# Patient Record
Sex: Female | Born: 1950 | Race: White | Hispanic: No | Smoking: Never smoker
Health system: Southern US, Community
[De-identification: ages and names within clinical notes are randomized; demographics above are authoritative.]

## PROBLEM LIST (undated history)

## (undated) DIAGNOSIS — F32A Depression, unspecified: Secondary | ICD-10-CM

## (undated) DIAGNOSIS — F329 Major depressive disorder, single episode, unspecified: Secondary | ICD-10-CM

## (undated) DIAGNOSIS — K219 Gastro-esophageal reflux disease without esophagitis: Secondary | ICD-10-CM

## (undated) DIAGNOSIS — F419 Anxiety disorder, unspecified: Secondary | ICD-10-CM

## (undated) DIAGNOSIS — L400 Psoriasis vulgaris: Secondary | ICD-10-CM

## (undated) DIAGNOSIS — I10 Essential (primary) hypertension: Secondary | ICD-10-CM

## (undated) DIAGNOSIS — I4891 Unspecified atrial fibrillation: Secondary | ICD-10-CM

## (undated) DIAGNOSIS — E079 Disorder of thyroid, unspecified: Secondary | ICD-10-CM

## (undated) HISTORY — DX: Depression, unspecified: F32.A

## (undated) HISTORY — DX: Anxiety disorder, unspecified: F41.9

## (undated) HISTORY — DX: Psoriasis vulgaris: L40.0

## (undated) HISTORY — DX: Unspecified atrial fibrillation: I48.91

## (undated) HISTORY — DX: Major depressive disorder, single episode, unspecified: F32.9

## (undated) HISTORY — DX: Essential (primary) hypertension: I10

## (undated) HISTORY — PX: ORIF ANKLE FRACTURE: SUR919

## (undated) HISTORY — DX: Disorder of thyroid, unspecified: E07.9

## (undated) HISTORY — DX: Gastro-esophageal reflux disease without esophagitis: K21.9

---

## 2006-04-26 ENCOUNTER — Ambulatory Visit: Payer: Self-pay | Admitting: Internal Medicine

## 2006-05-09 ENCOUNTER — Ambulatory Visit: Payer: Self-pay | Admitting: Internal Medicine

## 2007-04-23 ENCOUNTER — Emergency Department (HOSPITAL_COMMUNITY): Admission: EM | Admit: 2007-04-23 | Discharge: 2007-04-23 | Payer: Self-pay | Admitting: Emergency Medicine

## 2007-05-01 ENCOUNTER — Other Ambulatory Visit: Admission: RE | Admit: 2007-05-01 | Discharge: 2007-05-01 | Payer: Self-pay | Admitting: Family Medicine

## 2008-07-30 IMAGING — CR DG CHEST 1V PORT
1 series · 1 of 1 positions shown · non-contrast
Comparison: None.

CLINICAL DATA: 55 year old; chest pain.
 PORTABLE CHEST - 1 VIEW - 04/23/07:

[view not recorded]
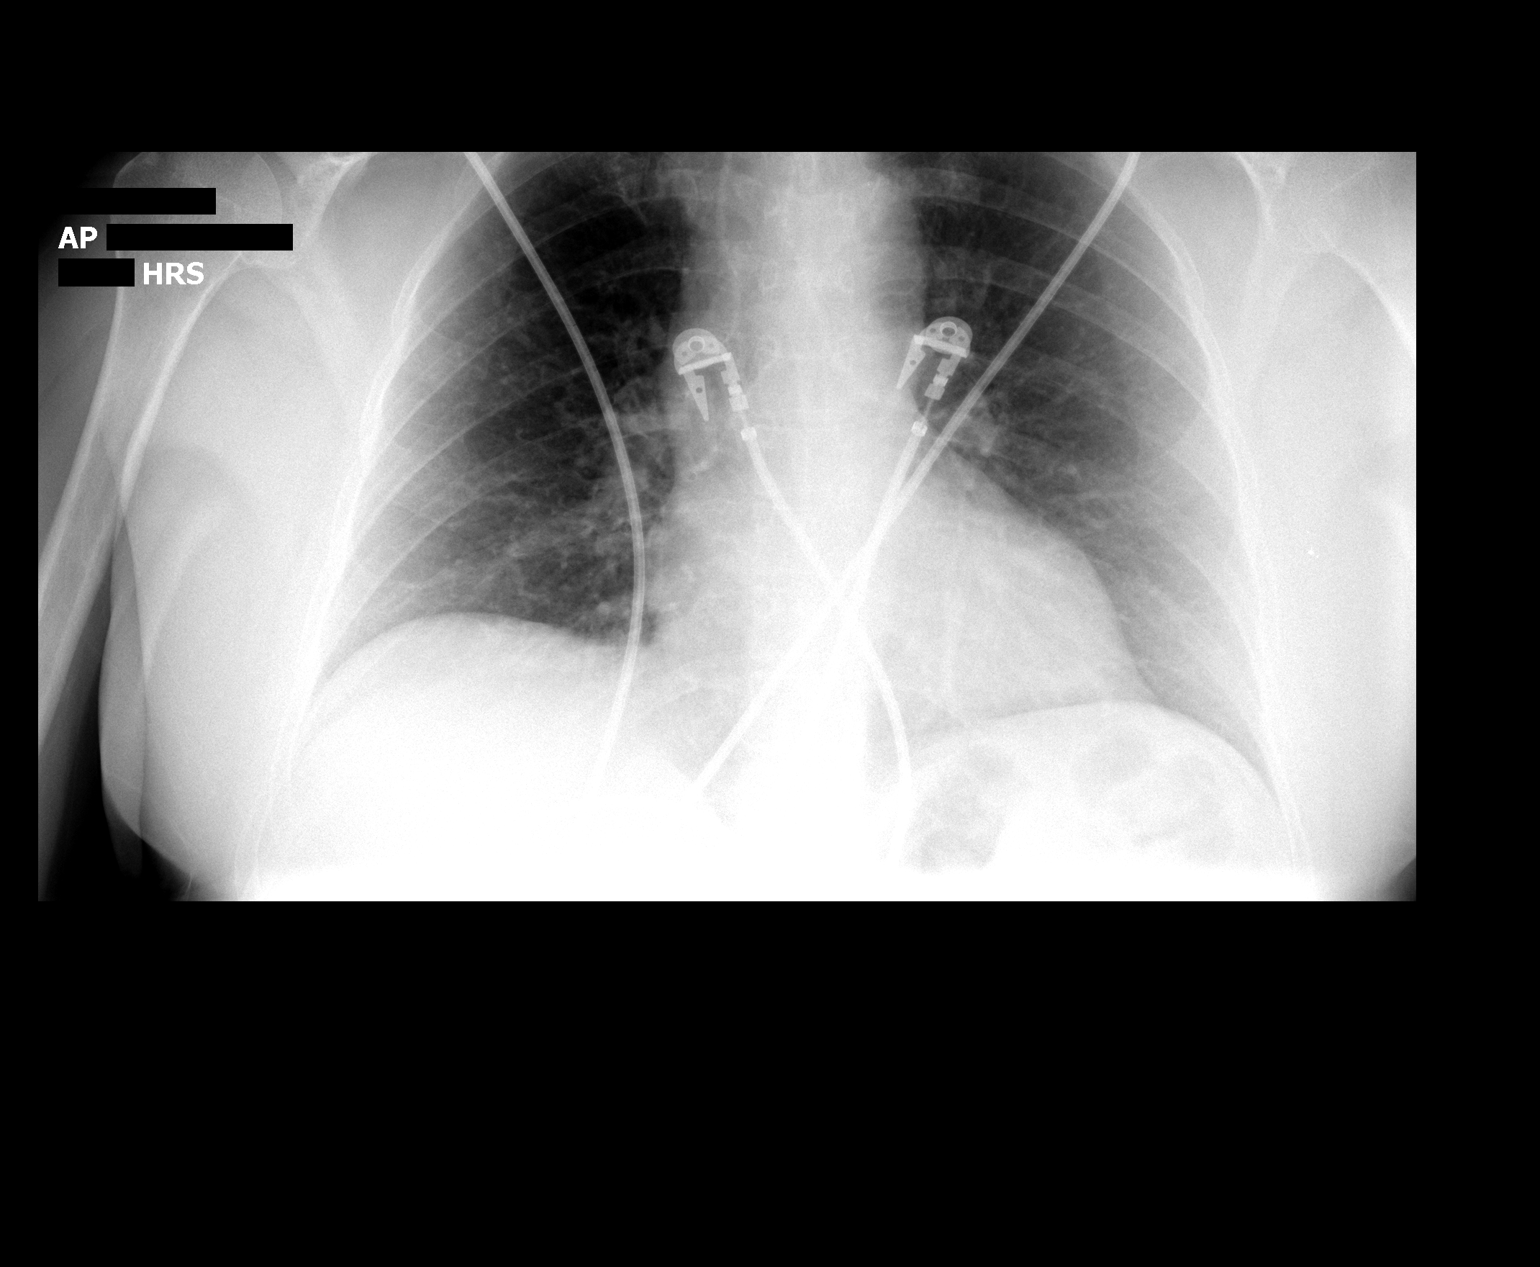

[1 of 1 positions shown; findings below may reference images not displayed]

FINDINGS: The cardiac silhouette, mediastinal and hilar contours are within normal limits.   The lungs are clear.   The bony structures are intact.
IMPRESSION: No acute cardiopulmonary findings.

## 2008-12-29 ENCOUNTER — Ambulatory Visit: Payer: Self-pay | Admitting: Gastroenterology

## 2008-12-29 DIAGNOSIS — R05 Cough: Secondary | ICD-10-CM

## 2008-12-29 DIAGNOSIS — R059 Cough, unspecified: Secondary | ICD-10-CM | POA: Insufficient documentation

## 2008-12-29 DIAGNOSIS — R1319 Other dysphagia: Secondary | ICD-10-CM | POA: Insufficient documentation

## 2008-12-29 DIAGNOSIS — R1110 Vomiting, unspecified: Secondary | ICD-10-CM | POA: Insufficient documentation

## 2009-01-05 ENCOUNTER — Encounter: Payer: Self-pay | Admitting: Gastroenterology

## 2009-01-05 ENCOUNTER — Ambulatory Visit (HOSPITAL_COMMUNITY): Admission: RE | Admit: 2009-01-05 | Discharge: 2009-01-05 | Payer: Self-pay | Admitting: Gastroenterology

## 2009-01-05 ENCOUNTER — Ambulatory Visit: Payer: Self-pay | Admitting: Gastroenterology

## 2009-01-06 ENCOUNTER — Telehealth: Payer: Self-pay | Admitting: Gastroenterology

## 2009-01-13 ENCOUNTER — Telehealth: Payer: Self-pay | Admitting: Gastroenterology

## 2009-01-13 ENCOUNTER — Ambulatory Visit: Payer: Self-pay | Admitting: Gastroenterology

## 2009-01-20 ENCOUNTER — Ambulatory Visit: Payer: Self-pay | Admitting: Gastroenterology

## 2011-03-15 NOTE — Consult Note (Signed)
[name], [name]                  ACCOUNT NO.:  [id number]   MEDICAL RECORD NO.:  [id number]          PATIENT TYPE:  EMS   LOCATION:  [facility]                         FACILITY:  [facility]   PHYSICIAN:  Corky Crafts, MDDATE OF BIRTH:  October 01, 1951   DATE OF CONSULTATION:  04/23/2007  DATE OF DISCHARGE:                                 CONSULTATION   REFERRING:  Dr. Marinda Elk.   REASON FOR CONSULTATION:  Diaphoresis and abdominal pain.   HISTORY OF PRESENT ILLNESS:  The patient is a 60 year old woman known no  known history of coronary artery disease, who has had a 3-week history  of fatigue that started after a vacation in [place].  She has  experienced increasing fatigue and diaphoresis over the past 2 days.  There is also 1 episode of squeezing across the midabdomen.  She has had  diarrhea, nausea, as well as vomiting.  She denies dyspnea, chest pain  or syncope.  She does not have palpitations.  She saw her primary care  physician.  There was some J-point depression noted on the EKG and  severe diaphoresis; she was then sent to the emergency room further  cardiac evaluation.   PAST MEDICAL HISTORY:  1. Depression.  2. Hypothyroidism.   FAMILY HISTORY:  Significant for coronary artery disease.   SOCIAL HISTORY:  She is a Runner, broadcasting/film/video.  She does not drink, smoke or use any  drugs.  She lives with her husband.   ALLERGIES:  CODEINE.   MEDICATIONS:  1. Effexor 150 mg a day.  2. Synthroid 75 mcg a day.   REVIEW OF SYSTEMS:  Significant for nausea, vomiting, diarrhea,  squeezing cross the abdomen and fatigue.  No chest pain.  No shortness  of breath.  No palpitations.  No syncope.  No focal weakness.  No rash.  All other systems negative.   PHYSICAL EXAM:  VITAL SIGNS:  Blood pressure is 143/90, pulse of 87,  respiratory rate of 18, ninety-five percent on room air.  GENERAL:  She is awake and alert, in no apparent distress.  HEENT:  Head:  Normocephalic, atraumatic.  Eyes:   Extraocular movements  are intact.  NECK:  No JVD.  CARDIOVASCULAR:  Regular rate and rhythm, S1 and S2.  LUNGS:  Clear to auscultation bilaterally.  ABDOMEN:  Mild diffuse tenderness.  No rebound tenderness.  No guarding.  EXTREMITIES:  Showed no edema.  NEUROLOGIC:  No focal deficits.  SKIN:  No rash.  BACK:  No kyphosis.  PSYCHIATRIC:  Normal mood and affect.   EKG:  Shows normal sinus rhythm, no pathologic Q waves, no ST-T wave  changes.   CHEST X-RAY:  Shows no acute disease.   LABORATORY DATA:  Troponin was 0.02, CK 75, MB 1.6 and INR 1.0.  Hematocrit 42.9, creatinine 0.7, AST 48, ALT 27, alkaline phosphatase  81, magnesium 1.8.  TSH is pending.   ASSESSMENT AND PLAN:  [age]-year-old with upper abdominal pain,  nausea, vomiting, hypothyroidism and fatigue.   PLAN:  1. Cardiac:  Given that her enzymes are normal despite prolonged  symptoms and her EKG is now normal, I doubt this is an acute      coronary syndrome.  We will plan for an outpatient stress test to      evaluate her.  2. Prescription for Phenergan has been given for nausea.  She does      state that she ate out at a restaurant yesterday and perhaps some      this is coming from a mild case of food poisoning.  3. Will follow up on her thyroid tests.  4. If her symptoms worsen, she should come back to the emergency room.      I will see her as an outpatient.      Corky Crafts, MD  Electronically Signed     JSV/MEDQ  D:  04/23/2007  T:  04/24/2007  Job:  [id number]

## 2011-08-17 LAB — CK TOTAL AND CKMB (NOT AT ARMC): CK, MB: 1.6

## 2011-08-17 LAB — APTT: aPTT: 26

## 2011-08-17 LAB — COMPREHENSIVE METABOLIC PANEL
Albumin: 3.2 — ABNORMAL LOW
Alkaline Phosphatase: 81
BUN: 15
Chloride: 104
GFR calc non Af Amer: >60
Glucose, Bld: 98
Potassium: 3.5

## 2011-08-17 LAB — TROPONIN I: Troponin I: 0.02

## 2011-08-17 LAB — CBC
HCT: 42.9
Hemoglobin: 14.6
MCHC: 34
RDW: 13.3

## 2011-08-17 LAB — MAGNESIUM: Magnesium: 1.8

## 2011-08-17 LAB — PROTIME-INR: INR: 1

## 2012-12-10 ENCOUNTER — Other Ambulatory Visit: Payer: Self-pay | Admitting: Dermatology

## 2014-04-03 ENCOUNTER — Encounter: Payer: Self-pay | Admitting: Gastroenterology

## 2014-08-19 ENCOUNTER — Encounter: Payer: Self-pay | Admitting: Gastroenterology

## 2015-09-04 ENCOUNTER — Encounter: Payer: Self-pay | Admitting: Gastroenterology

## 2015-11-23 DIAGNOSIS — R002 Palpitations: Secondary | ICD-10-CM | POA: Insufficient documentation

## 2015-11-23 DIAGNOSIS — R0600 Dyspnea, unspecified: Secondary | ICD-10-CM | POA: Insufficient documentation

## 2015-11-23 DIAGNOSIS — R079 Chest pain, unspecified: Secondary | ICD-10-CM | POA: Insufficient documentation

## 2015-12-08 DIAGNOSIS — E785 Hyperlipidemia, unspecified: Secondary | ICD-10-CM | POA: Insufficient documentation

## 2016-01-06 DIAGNOSIS — I361 Nonrheumatic tricuspid (valve) insufficiency: Secondary | ICD-10-CM | POA: Insufficient documentation

## 2016-01-06 DIAGNOSIS — I340 Nonrheumatic mitral (valve) insufficiency: Secondary | ICD-10-CM | POA: Insufficient documentation

## 2016-02-24 DIAGNOSIS — R5381 Other malaise: Secondary | ICD-10-CM | POA: Insufficient documentation

## 2018-01-16 ENCOUNTER — Encounter: Payer: Self-pay | Admitting: Sports Medicine

## 2018-08-17 ENCOUNTER — Encounter: Payer: Self-pay | Admitting: Sports Medicine

## 2018-08-17 ENCOUNTER — Ambulatory Visit (INDEPENDENT_AMBULATORY_CARE_PROVIDER_SITE_OTHER): Payer: Medicare HMO | Admitting: Sports Medicine

## 2018-08-17 DIAGNOSIS — I10 Essential (primary) hypertension: Secondary | ICD-10-CM | POA: Diagnosis not present

## 2018-08-17 DIAGNOSIS — Z0000 Encounter for general adult medical examination without abnormal findings: Secondary | ICD-10-CM

## 2018-08-17 DIAGNOSIS — K219 Gastro-esophageal reflux disease without esophagitis: Secondary | ICD-10-CM | POA: Insufficient documentation

## 2018-08-17 DIAGNOSIS — F329 Major depressive disorder, single episode, unspecified: Secondary | ICD-10-CM

## 2018-08-17 DIAGNOSIS — E039 Hypothyroidism, unspecified: Secondary | ICD-10-CM | POA: Insufficient documentation

## 2018-08-17 DIAGNOSIS — E782 Mixed hyperlipidemia: Secondary | ICD-10-CM

## 2018-08-17 DIAGNOSIS — I482 Chronic atrial fibrillation, unspecified: Secondary | ICD-10-CM | POA: Diagnosis not present

## 2018-08-17 LAB — COMPREHENSIVE METABOLIC PANEL
AG Ratio: 1.9 (calc) (ref 1.0–2.5)
AST: 36 U/L — ABNORMAL HIGH (ref 10–35)
Albumin: 4.3 g/dL (ref 3.6–5.1)
Alkaline phosphatase (APISO): 79 U/L (ref 33–130)
CO2: 25 mmol/L (ref 20–32)
Calcium: 9.5 mg/dL (ref 8.6–10.4)
Chloride: 102 mmol/L (ref 98–110)
Globulin: 2.3 g/dL (calc) (ref 1.9–3.7)
Potassium: 3.8 mmol/L (ref 3.5–5.3)

## 2018-08-17 LAB — LIPID PANEL W/REFLEX DIRECT LDL
Cholesterol: 230 mg/dL — ABNORMAL HIGH (ref <200)
HDL: 54 mg/dL (ref >50)
LDL Cholesterol (Calc): 137 mg/dL — ABNORMAL HIGH
Non-HDL Cholesterol (Calc): 176 mg/dL — ABNORMAL HIGH (ref <130)
Total CHOL/HDL Ratio: 4.3 (calc) (ref <5.0)
Triglycerides: 242 mg/dL — ABNORMAL HIGH (ref <150)

## 2018-08-17 LAB — CBC
HCT: 39.3 % (ref 35.0–45.0)
Hemoglobin: 13.5 g/dL (ref 11.7–15.5)
MCH: 31.3 pg (ref 27.0–33.0)
MCHC: 34.4 g/dL (ref 32.0–36.0)
MCV: 91 fL (ref 80.0–100.0)
MPV: 9.9 fL (ref 7.5–12.5)
Platelets: 252 Thousand/uL (ref 140–400)
RBC: 4.32 10*6/uL (ref 3.80–5.10)
RDW: 12.4 % (ref 11.0–15.0)
WBC: 8.5 Thousand/uL (ref 3.8–10.8)

## 2018-08-17 LAB — COMPREHENSIVE METABOLIC PANEL WITH GFR
ALT: 29 U/L (ref 6–29)
BUN: 20 mg/dL (ref 7–25)
Creat: 0.96 mg/dL (ref 0.50–0.99)
Glucose, Bld: 102 mg/dL (ref 65–139)
Sodium: 140 mmol/L (ref 135–146)
Total Bilirubin: 0.4 mg/dL (ref 0.2–1.2)
Total Protein: 6.6 g/dL (ref 6.1–8.1)

## 2018-08-17 LAB — TSH: TSH: 2.18 m[IU]/L (ref 0.40–4.50)

## 2018-08-17 MED ORDER — VENLAFAXINE HCL ER 150 MG PO TB24: 1.0000 | ORAL_TABLET | Freq: Every day | ORAL | 11 refills | Status: DC

## 2018-08-17 MED ORDER — PANTOPRAZOLE SODIUM 40 MG PO TBEC: 40.0000 mg | DELAYED_RELEASE_TABLET | Freq: Every day | ORAL | 3 refills | Status: DC

## 2018-08-17 MED ORDER — FLECAINIDE ACETATE 150 MG PO TABS: 75.0000 mg | ORAL_TABLET | Freq: Two times a day (BID) | ORAL | 3 refills | Status: DC

## 2018-08-17 NOTE — Assessment & Plan Note (Signed)
Elevated but increasing flecainide today.

## 2018-08-17 NOTE — Assessment & Plan Note (Signed)
Rechecking labs 

## 2018-08-17 NOTE — Progress Notes (Addendum)
Subjective:    CC: New patient visit with the below complaints as noted in HPI:  HPI:  [name] is a pleasant 67 year old female with multiple medical problems.  Her main complaint today is that she needs a refill on her pantoprazole.  Unfortunately during initial intake we noted her pulse to be fluctuating between 110 and 160.  She does have a history of atrial fibrillation, she was discontinued off of her metoprolol, diltiazem was continued and she was started on 100 twice a day of flecainide.  Unfortunately this dropped her pulse rate down into the 40s.  She was dropped to 50 mg twice a day of flecainide.  Does get some palpitations, no chest pain.  She does have a recent echocardiogram that showed preserved ejection fraction.  Hypothyroidism: Stable on current medications.  Hypertension: Elevated today, no headaches, visual changes, chest pain.  Depression: Currently on Effexor, she takes 1.5 tablets of 100 mg Effexor tabs.  I reviewed the past medical history, family history, social history, surgical history, and allergies today and no changes were needed.  Please see the problem list section below in epic for further details.  Past Medical History: Past Medical History:  Diagnosis Date  . Anxiety   . Atrial fibrillation ([place])   . Depression   . GERD (gastroesophageal reflux disease)   . Hypertension   . Plaque psoriasis   . Thyroid disease    Past Surgical History: Past Surgical History:  Procedure Laterality Date  . ORIF ANKLE FRACTURE     Social History: Social History   Socioeconomic History  . Marital status: Married    Spouse name: Not on file  . Number of children: Not on file  . Years of education: Not on file  . Highest education level: Not on file  Occupational History  . Not on file  Social Needs  . Financial resource strain: Not on file  . Food insecurity:    Worry: Not on file    Inability: Not on file  . Transportation needs:    Medical: Not on file    Non-medical: Not on file  Tobacco Use  . Smoking status: Never Smoker  . Smokeless tobacco: Never Used  Substance and Sexual Activity  . Alcohol use: Never    Frequency: Never  . Drug use: Never  . Sexual activity: Not on file  Lifestyle  . Physical activity:    Days per week: Not on file    Minutes per session: Not on file  . Stress: Not on file  Relationships  . Social connections:    Talks on phone: Not on file    Gets together: Not on file    Attends religious service: Not on file    Active member of club or organization: Not on file    Attends meetings of clubs or organizations: Not on file    Relationship status: Not on file  Other Topics Concern  . Not on file  Social History Narrative  . Not on file   Family History: Family History  Problem Relation Age of Onset  . Hypertension Mother   . Hypertension Father   . Cancer Father        colon  . Cancer Daughter        daughter  . Cancer Son        skin  . Cancer Maternal Aunt        breast  . Hypertension Maternal Grandmother   . Hypertension Maternal Grandfather   .  Heart attack Maternal Grandfather   . Hypertension Paternal Grandmother   . Stroke Paternal Grandfather   . Cancer Maternal Aunt        breast   Allergies: No Known Allergies Medications: See med rec.  Review of Systems: No headache, visual changes, nausea, vomiting, diarrhea, constipation, dizziness, abdominal pain, skin rash, fevers, chills, night sweats, swollen lymph nodes, weight loss, chest pain, body aches, joint swelling, muscle aches, shortness of breath, mood changes, visual or auditory hallucinations.  Objective:    General: Well Developed, well nourished, and in no acute distress.  Neuro: Alert and oriented x3, extra-ocular muscles intact, sensation grossly intact.  HEENT: Normocephalic, atraumatic, pupils equal round reactive to light, neck supple, no masses, no lymphadenopathy, thyroid nonpalpable.  Skin: Warm and dry, no  rashes noted.  Cardiac: Tachycardic, for the most part regular rhythm, no murmurs rubs or gallops.  2+ lower extremity pitting edema. Respiratory: Clear to auscultation bilaterally. Not using accessory muscles, speaking in full sentences.  Abdominal: Soft, nontender, nondistended, positive bowel sounds, no masses, no organomegaly.  Musculoskeletal: Shoulder, elbow, wrist, hip, knee, ankle stable, and with full range of motion.  Twelve-lead ECG reviewed and shows atrial fibrillation with a rate of 114 bpm, left axis deviation, possible upsloping ST depression in V2, V3, V4, V5, V6.  Impression and Recommendations:    The patient was counselled, risk factors were discussed, anticipatory guidance given.  GERD (gastroesophageal reflux disease) Refilling pantoprazole  Hypothyroidism Rechecking labs.  Depression, major Switching to Effexor 150 mg tablet instead of her taking 1.5 tablets of the 100 mg tab  Annual physical exam We will address her preventive measures at the follow-up visit  Benign essential hypertension Elevated but increasing flecainide today.  Chronic atrial fibrillation Currently on Eliquis. In rapid ventricular response today, pulse rate was as high as 160, 114 on ECG. Currently taking diltiazem, Eliquis, Dyazide. She is on flecainide. Heart rate dropped to 40 in flexion at 100 twice daily, and is in the mid to lower 100s on flecainide 50 twice daily. We are going to drop to 75 mg twice daily with a recheck in 2 weeks. She is in between cardiologists right now.  Mixed hyperlipidemia Lipids are elevated including LDL which is not affected by not fasting.  Considering her cardiac history we should strongly consider treating this aggressively to an LDL goal of 70.  This means medication if ok with her.  Starting atorvastatin 40.  Recheck lipids in 2-3 months.  I spent 60 minutes with this patient, greater than 50% was face-to-face time counseling regarding the above  diagnoses, specifically the evaluation and management of her cardiac arrhythmia. ___________________________________________ [name]. [name], M.D., ABFM., CAQSM. Primary Care and Sports Medicine [facility]  Adjunct Professor of [place] [employer] [facility] [employer]

## 2018-08-17 NOTE — Assessment & Plan Note (Signed)
Refilling pantoprazole

## 2018-08-17 NOTE — Assessment & Plan Note (Signed)
We will address her preventive measures at the follow-up visit

## 2018-08-17 NOTE — Assessment & Plan Note (Signed)
Currently on Eliquis. In rapid ventricular response today, pulse rate was as high as 160, 114 on ECG. Currently taking diltiazem, Eliquis, Dyazide. She is on flecainide. Heart rate dropped to 40 in flexion at 100 twice daily, and is in the mid to lower 100s on flecainide 50 twice daily. We are going to drop to 75 mg twice daily with a recheck in 2 weeks. She is in between cardiologists right now.

## 2018-08-17 NOTE — Assessment & Plan Note (Signed)
Switching to Effexor 150 mg tablet instead of her taking 1.5 tablets of the 100 mg tab

## 2018-08-19 DIAGNOSIS — E782 Mixed hyperlipidemia: Secondary | ICD-10-CM | POA: Insufficient documentation

## 2018-08-19 NOTE — Assessment & Plan Note (Addendum)
Lipids are elevated including LDL which is not affected by not fasting.  Considering her cardiac history we should strongly consider treating this aggressively to an LDL goal of 70.  This means medication if ok with her.  Starting atorvastatin 40.  Recheck lipids in 2-3 months.

## 2018-08-20 ENCOUNTER — Telehealth: Payer: Self-pay | Admitting: Sports Medicine

## 2018-08-20 NOTE — Telephone Encounter (Signed)
Patients spouse Tree surgeon) called and wanted to double check that the patient was supposed to be taking the 150 mg Flecainide daily. He was under the impression that she was only supposed to be on the 100 mg dose.   He was also inquiring on the Levothyroxine and wanted to know if you were increasing the dose or keeping it at 75 mg. Please advise.

## 2018-08-20 NOTE — Telephone Encounter (Signed)
Spouse was in office today and spoke with Dr. Darene Lamer.

## 2018-08-20 NOTE — Telephone Encounter (Signed)
Left message for patient to call back about medication question.

## 2018-08-20 NOTE — Telephone Encounter (Signed)
No, she and I were very clear that she would be doing 75 mg twice a day.  So that would be one HALF of a 150 mg tablet twice a day.  She was on 50 mg twice a day, not 100 mg twice a day previously.  100 mg made her too bradycardic.  She is tachycardic on 50 mg twice a day thus my recommendation to take 75 mg twice a day.  Because it does not come in a 75 mg tablet I called in 150 mg tablets and she will break it in half and do one half tab twice a day.  Does that make sense?

## 2018-08-23 MED ORDER — ATORVASTATIN CALCIUM 40 MG PO TABS
40.0000 mg | ORAL_TABLET | Freq: Every day | ORAL | 3 refills | Status: DC
Start: 2018-08-23 — End: 2019-07-23

## 2018-08-23 NOTE — Addendum Note (Signed)
Addended by: Silverio Decamp on: 08/23/2018 10:06 AM   Modules accepted: Orders

## 2018-09-04 ENCOUNTER — Ambulatory Visit: Payer: Medicare HMO | Admitting: Sports Medicine

## 2018-11-26 ENCOUNTER — Other Ambulatory Visit: Payer: Self-pay | Admitting: Sports Medicine

## 2019-02-05 ENCOUNTER — Telehealth: Payer: Self-pay | Admitting: Neurology

## 2019-02-05 NOTE — Telephone Encounter (Signed)
Received vm from patient's spouse stating we denied RX for patient's Levothyroxine.  It doesn't look like Levothyroxine was ever written from this office or recently denied, but there is a phone note asking about it from October. Since this is a Dr. [name] patient, Amber can you please advise on medication.

## 2019-02-06 ENCOUNTER — Other Ambulatory Visit: Payer: Self-pay | Admitting: *Deleted

## 2019-02-06 MED ORDER — LEVOTHYROXINE SODIUM 75 MCG PO TABS: 75.0000 ug | ORAL_TABLET | Freq: Every day | ORAL | 3 refills | Status: DC

## 2019-02-06 NOTE — Telephone Encounter (Signed)
Levothyroxine refilled. Pt notified.

## 2019-03-17 ENCOUNTER — Other Ambulatory Visit: Payer: Self-pay | Admitting: Sports Medicine

## 2019-03-18 NOTE — Telephone Encounter (Signed)
Have not seen her in over 6 months, lets do a Doximity visit

## 2019-03-18 NOTE — Telephone Encounter (Signed)
Please contact Pt to schedule. Thank you

## 2019-03-19 ENCOUNTER — Other Ambulatory Visit: Payer: Self-pay

## 2019-03-19 MED ORDER — TRAZODONE HCL 100 MG PO TABS: 100.0000 mg | ORAL_TABLET | Freq: Every day | ORAL | 1 refills | Status: DC

## 2019-03-19 NOTE — Telephone Encounter (Signed)
[name] has a follow up on 03/27/19. She needs a refill on Trazodone 100 mg QHS. Historical provider.

## 2019-03-19 NOTE — Telephone Encounter (Signed)
Appointment has been made. No further questions at this time.  

## 2019-03-27 ENCOUNTER — Encounter: Payer: Self-pay | Admitting: Sports Medicine

## 2019-03-27 ENCOUNTER — Ambulatory Visit (INDEPENDENT_AMBULATORY_CARE_PROVIDER_SITE_OTHER): Payer: Medicare Other | Admitting: Sports Medicine

## 2019-03-27 ENCOUNTER — Ambulatory Visit (INDEPENDENT_AMBULATORY_CARE_PROVIDER_SITE_OTHER): Payer: Medicare Other

## 2019-03-27 ENCOUNTER — Other Ambulatory Visit: Payer: Self-pay

## 2019-03-27 DIAGNOSIS — M48061 Spinal stenosis, lumbar region without neurogenic claudication: Secondary | ICD-10-CM

## 2019-03-27 DIAGNOSIS — Z0000 Encounter for general adult medical examination without abnormal findings: Secondary | ICD-10-CM

## 2019-03-27 DIAGNOSIS — M7989 Other specified soft tissue disorders: Secondary | ICD-10-CM

## 2019-03-27 DIAGNOSIS — I10 Essential (primary) hypertension: Secondary | ICD-10-CM

## 2019-03-27 DIAGNOSIS — I482 Chronic atrial fibrillation, unspecified: Secondary | ICD-10-CM

## 2019-03-27 DIAGNOSIS — B351 Tinea unguium: Secondary | ICD-10-CM

## 2019-03-27 MED ORDER — TERBINAFINE HCL 250 MG PO TABS: 250.0000 mg | ORAL_TABLET | Freq: Every day | ORAL | 1 refills | Status: DC

## 2019-03-27 NOTE — Assessment & Plan Note (Signed)
Needs to get lower extremity compression hose. Elevate legs at night. I do not see any need to change her medications around initially.

## 2019-03-27 NOTE — Assessment & Plan Note (Signed)
Adding 6 months of Lamisil.

## 2019-03-27 NOTE — Assessment & Plan Note (Signed)
Desires to avoid medications. X-rays, formal physical therapy. Return in 4 weeks, MRI if no better.

## 2019-03-27 NOTE — Assessment & Plan Note (Signed)
Post ablation, doing well.

## 2019-03-27 NOTE — Assessment & Plan Note (Signed)
Due for some preventive measures, declines all vaccines, she feels as though the [place] are putting poisons in the vaccines to hurt [place]. Due for mammogram, bone density testing, hepatitis C testing. Cologuard testing.

## 2019-03-27 NOTE — Assessment & Plan Note (Signed)
Well-controlled, no changes. She does have some fatigue that is likely secondary to the flecainide. She has had a negative sleep study recently.

## 2019-03-27 NOTE — Progress Notes (Signed)
Subjective:    CC: Multiple issues  HPI: This is a pleasant 68 year old female, she has multiple complaints.  Toenail discoloration: Right-sided, great toe, yellowish.  Otherwise asymptomatic.  Back pain: Worse with standing, worse at night, tingling in the feet at night.  Better with sitting, flexion.  No bowel or bladder dysfunction, saddle numbness, constitutional symptoms.  Atrial fibrillation: Recently status post ablation, she is on flecainide and having some of the expected fatigue.  She tells me that she did have a negative sleep study 3 years ago.  Leg swelling: Has had a few pound weight gain, no pain, no shortness of breath, no chest pain, no paroxysmal nocturnal dyspnea, no orthopnea.  I reviewed the past medical history, family history, social history, surgical history, and allergies today and no changes were needed.  Please see the problem list section below in epic for further details.  Past Medical History: Past Medical History:  Diagnosis Date  . Anxiety   . Atrial fibrillation ([place])   . Depression   . GERD (gastroesophageal reflux disease)   . Hypertension   . Plaque psoriasis   . Thyroid disease    Past Surgical History: Past Surgical History:  Procedure Laterality Date  . ORIF ANKLE FRACTURE     Social History: Social History   Socioeconomic History  . Marital status: Married    Spouse name: Not on file  . Number of children: Not on file  . Years of education: Not on file  . Highest education level: Not on file  Occupational History  . Not on file  Social Needs  . Financial resource strain: Not on file  . Food insecurity:    Worry: Not on file    Inability: Not on file  . Transportation needs:    Medical: Not on file    Non-medical: Not on file  Tobacco Use  . Smoking status: Never Smoker  . Smokeless tobacco: Never Used  Substance and Sexual Activity  . Alcohol use: Never    Frequency: Never  . Drug use: Never  . Sexual activity: Not  on file  Lifestyle  . Physical activity:    Days per week: Not on file    Minutes per session: Not on file  . Stress: Not on file  Relationships  . Social connections:    Talks on phone: Not on file    Gets together: Not on file    Attends religious service: Not on file    Active member of club or organization: Not on file    Attends meetings of clubs or organizations: Not on file    Relationship status: Not on file  Other Topics Concern  . Not on file  Social History Narrative  . Not on file   Family History: Family History  Problem Relation Age of Onset  . Hypertension Mother   . Hypertension Father   . Cancer Father        colon  . Cancer Daughter        daughter  . Cancer Son        skin  . Cancer Maternal Aunt        breast  . Hypertension Maternal Grandmother   . Hypertension Maternal Grandfather   . Heart attack Maternal Grandfather   . Hypertension Paternal Grandmother   . Stroke Paternal Grandfather   . Cancer Maternal Aunt        breast   Allergies: No Known Allergies Medications: See med rec.  Review of  Systems: No fevers, chills, night sweats, weight loss, chest pain, or shortness of breath.   Objective:    General: Well Developed, well nourished, and in no acute distress.  Neuro: Alert and oriented x3, extra-ocular muscles intact, sensation grossly intact.  HEENT: Normocephalic, atraumatic, pupils equal round reactive to light, neck supple, no masses, no lymphadenopathy, thyroid nonpalpable.  Skin: Warm and dry, no rashes.  Right great toenail with onychodystrophy, thickening, yellowing. Cardiac: Regular rate and rhythm, no murmurs rubs or gallops, 1+ bilateral pitting lower extremity edema.  Respiratory: Clear to auscultation bilaterally. Not using accessory muscles, speaking in full sentences.  Impression and Recommendations:    Annual physical exam Due for some preventive measures, declines all vaccines, she feels as though the [place] are  putting poisons in the vaccines to hurt [place]. Due for mammogram, bone density testing, hepatitis C testing. Cologuard testing.  Benign essential hypertension Well-controlled, no changes. She does have some fatigue that is likely secondary to the flecainide. She has had a negative sleep study recently.   Chronic atrial fibrillation Post ablation, doing well.  Lumbar spinal stenosis Desires to avoid medications. X-rays, formal physical therapy. Return in 4 weeks, MRI if no better.  Localized swelling of lower extremity Needs to get lower extremity compression hose. Elevate legs at night. I do not see any need to change her medications around initially.  Onychomycosis Adding 6 months of Lamisil.   I spent 40 minutes with this patient, greater than 50% was face-to-face time counseling regarding the above diagnoses  ___________________________________________ [name]. [name], M.D., ABFM., CAQSM. Primary Care and Sports Medicine [facility]  Adjunct Professor of [place] [employer] [facility] [employer] Medicine

## 2019-03-28 ENCOUNTER — Other Ambulatory Visit: Payer: Self-pay | Admitting: Sports Medicine

## 2019-03-28 DIAGNOSIS — F329 Major depressive disorder, single episode, unspecified: Secondary | ICD-10-CM

## 2019-04-15 ENCOUNTER — Telehealth: Payer: Self-pay

## 2019-04-15 DIAGNOSIS — Z1211 Encounter for screening for malignant neoplasm of colon: Secondary | ICD-10-CM

## 2019-04-15 NOTE — Telephone Encounter (Signed)
Cologuard request was ordered with the wrong ICD10 code. Ordered with correct code.

## 2019-04-16 LAB — CBC
HCT: 41.6 % (ref 35.0–45.0)
Hemoglobin: 14.3 g/dL (ref 11.7–15.5)
MCH: 32.3 pg (ref 27.0–33.0)
MCHC: 34.4 g/dL (ref 32.0–36.0)
MCV: 93.9 fL (ref 80.0–100.0)
MPV: 9.5 fL (ref 7.5–12.5)
Platelets: 230 10*3/uL (ref 140–400)
RBC: 4.43 10*6/uL (ref 3.80–5.10)
RDW: 13.5 % (ref 11.0–15.0)
WBC: 6.4 10*3/uL (ref 3.8–10.8)

## 2019-04-16 LAB — COMPLETE METABOLIC PANEL WITH GFR
AG Ratio: 2 (calc) (ref 1.0–2.5)
ALT: 31 U/L — ABNORMAL HIGH (ref 6–29)
AST: 44 U/L — ABNORMAL HIGH (ref 10–35)
Albumin: 4.5 g/dL (ref 3.6–5.1)
Alkaline phosphatase (APISO): 97 U/L (ref 37–153)
BUN: 16 mg/dL (ref 7–25)
CO2: 30 mmol/L (ref 20–32)
Calcium: 10 mg/dL (ref 8.6–10.4)
Chloride: 101 mmol/L (ref 98–110)
Creat: 0.86 mg/dL (ref 0.50–0.99)
GFR, Est African American: 80 mL/min/{1.73_m2} (ref >=60)
GFR, Est Non African American: 69 mL/min/{1.73_m2} (ref >=60)
Globulin: 2.3 g/dL (calc) (ref 1.9–3.7)
Glucose, Bld: 108 mg/dL — ABNORMAL HIGH (ref 65–99)
Potassium: 4 mmol/L (ref 3.5–5.3)
Sodium: 142 mmol/L (ref 135–146)
Total Bilirubin: 0.7 mg/dL (ref 0.2–1.2)
Total Protein: 6.8 g/dL (ref 6.1–8.1)

## 2019-04-16 LAB — HEPATITIS C ANTIBODY
Hepatitis C Ab: NONREACTIVE
SIGNAL TO CUT-OFF: 0.01 (ref <1.00)

## 2019-04-16 LAB — TSH: TSH: 2.99 mIU/L (ref 0.40–4.50)

## 2019-04-16 LAB — HEMOGLOBIN A1C
Hgb A1c MFr Bld: 5.6 % of total Hgb (ref <5.7)
Mean Plasma Glucose: 114 (calc)
eAG (mmol/L): 6.3 (calc)

## 2019-04-16 LAB — LIPID PANEL W/REFLEX DIRECT LDL
Cholesterol: 151 mg/dL (ref <200)
HDL: 55 mg/dL (ref >=50)
LDL Cholesterol (Calc): 64 mg/dL (calc)
Non-HDL Cholesterol (Calc): 96 mg/dL (calc) (ref <130)
Total CHOL/HDL Ratio: 2.7 (calc) (ref <5.0)
Triglycerides: 273 mg/dL — ABNORMAL HIGH (ref <150)

## 2019-04-18 LAB — HM MAMMOGRAPHY

## 2019-04-24 ENCOUNTER — Encounter: Payer: Self-pay | Admitting: Sports Medicine

## 2019-04-24 ENCOUNTER — Ambulatory Visit: Payer: Medicare Other | Admitting: Sports Medicine

## 2019-04-24 ENCOUNTER — Other Ambulatory Visit: Payer: Medicare Other

## 2019-04-24 DIAGNOSIS — Z0000 Encounter for general adult medical examination without abnormal findings: Secondary | ICD-10-CM

## 2019-04-24 DIAGNOSIS — M48061 Spinal stenosis, lumbar region without neurogenic claudication: Secondary | ICD-10-CM | POA: Diagnosis not present

## 2019-04-24 MED ORDER — MELOXICAM 15 MG PO TABS: ORAL_TABLET | ORAL | 3 refills | Status: DC

## 2019-04-24 MED ORDER — PREDNISONE 50 MG PO TABS: ORAL_TABLET | ORAL | 0 refills | Status: DC

## 2019-04-24 MED ORDER — ACETAMINOPHEN ER 650 MG PO TBCR: 650.0000 mg | EXTENDED_RELEASE_TABLET | Freq: Three times a day (TID) | ORAL | 3 refills | Status: DC | PRN

## 2019-04-24 NOTE — Assessment & Plan Note (Signed)
Scheduled for bone density test, mammogram done, she needs further imaging. She recently sent off her Cologuard test.

## 2019-04-24 NOTE — Assessment & Plan Note (Addendum)
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 Never did physical therapy, we are going to add prednisone, Tylenol, formal PT. Return to see me in 4 to 6 weeks, MRI if no better.

## 2019-04-24 NOTE — Progress Notes (Signed)
Subjective:    CC: Follow-up  HPI: Low back pain: Symptoms consistent with lumbar spinal stenosis, has not done medications or therapy.  No bowel or bladder dysfunction, saddle numbness, constant symptoms.  Pain is moderate, persistent, localized in the bilateral low back without radiation past the knees.  Preventive measures: Nearly completely up-to-date.  See below for further details.  I reviewed the past medical history, family history, social history, surgical history, and allergies today and no changes were needed.  Please see the problem list section below in epic for further details.  Past Medical History: Past Medical History:  Diagnosis Date  . Anxiety   . Atrial fibrillation ([place])   . Depression   . GERD (gastroesophageal reflux disease)   . Hypertension   . Plaque psoriasis   . Thyroid disease    Past Surgical History: Past Surgical History:  Procedure Laterality Date  . ORIF ANKLE FRACTURE     Social History: Social History   Socioeconomic History  . Marital status: Married    Spouse name: Not on file  . Number of children: Not on file  . Years of education: Not on file  . Highest education level: Not on file  Occupational History  . Not on file  Social Needs  . Financial resource strain: Not on file  . Food insecurity    Worry: Not on file    Inability: Not on file  . Transportation needs    Medical: Not on file    Non-medical: Not on file  Tobacco Use  . Smoking status: Never Smoker  . Smokeless tobacco: Never Used  Substance and Sexual Activity  . Alcohol use: Never    Frequency: Never  . Drug use: Never  . Sexual activity: Not on file  Lifestyle  . Physical activity    Days per week: Not on file    Minutes per session: Not on file  . Stress: Not on file  Relationships  . Social Herbalist on phone: Not on file    Gets together: Not on file    Attends religious service: Not on file    Active member of club or organization:  Not on file    Attends meetings of clubs or organizations: Not on file    Relationship status: Not on file  Other Topics Concern  . Not on file  Social History Narrative  . Not on file   Family History: Family History  Problem Relation Age of Onset  . Hypertension Mother   . Hypertension Father   . Cancer Father        colon  . Cancer Daughter        daughter  . Cancer Son        skin  . Cancer Maternal Aunt        breast  . Hypertension Maternal Grandmother   . Hypertension Maternal Grandfather   . Heart attack Maternal Grandfather   . Hypertension Paternal Grandmother   . Stroke Paternal Grandfather   . Cancer Maternal Aunt        breast   Allergies: No Known Allergies Medications: See med rec.  Review of Systems: No fevers, chills, night sweats, weight loss, chest pain, or shortness of breath.   Objective:    General: Well Developed, well nourished, and in no acute distress.  Neuro: Alert and oriented x3, extra-ocular muscles intact, sensation grossly intact.  HEENT: Normocephalic, atraumatic, pupils equal round reactive to light, neck supple, no masses,  no lymphadenopathy, thyroid nonpalpable.  Skin: Warm and dry, no rashes. Cardiac: Regular rate and rhythm, no murmurs rubs or gallops, no lower extremity edema.  Respiratory: Clear to auscultation bilaterally. Not using accessory muscles, speaking in full sentences.  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 Never did physical therapy, we are going to add prednisone, Tylenol, formal PT. Return to see me in 4 to 6 weeks, MRI if no better.  Annual physical exam Scheduled for bone density test, mammogram done, she needs further imaging. She recently sent off her Cologuard test.   ___________________________________________ [name]. [name], M.D., ABFM., CAQSM. Primary Care and Sports Medicine [facility]  [place] [place]  Adjunct Professor of [place] of [facility] [employer] Medicine

## 2019-04-25 ENCOUNTER — Encounter: Payer: Self-pay | Admitting: Sports Medicine

## 2019-05-01 ENCOUNTER — Other Ambulatory Visit: Payer: Medicare Other

## 2019-05-01 ENCOUNTER — Ambulatory Visit: Payer: Medicare Other

## 2019-05-02 ENCOUNTER — Encounter: Payer: Self-pay | Admitting: Sports Medicine

## 2019-05-14 ENCOUNTER — Other Ambulatory Visit: Payer: Self-pay | Admitting: Sports Medicine

## 2019-05-30 LAB — COLOGUARD

## 2019-06-05 ENCOUNTER — Ambulatory Visit: Payer: Medicare Other | Admitting: Sports Medicine

## 2019-06-05 ENCOUNTER — Other Ambulatory Visit: Payer: Self-pay | Admitting: Sports Medicine

## 2019-06-05 DIAGNOSIS — R195 Other fecal abnormalities: Secondary | ICD-10-CM | POA: Insufficient documentation

## 2019-06-05 NOTE — Assessment & Plan Note (Signed)
Referral for colonoscopy °

## 2019-06-26 ENCOUNTER — Ambulatory Visit: Payer: Medicare Other | Admitting: Sports Medicine

## 2019-07-23 ENCOUNTER — Other Ambulatory Visit: Payer: Self-pay | Admitting: Sports Medicine

## 2019-07-23 DIAGNOSIS — E782 Mixed hyperlipidemia: Secondary | ICD-10-CM

## 2019-07-23 MED ORDER — ATORVASTATIN CALCIUM 40 MG PO TABS: 40.0000 mg | ORAL_TABLET | Freq: Every day | ORAL | 3 refills | Status: DC

## 2019-07-25 ENCOUNTER — Encounter: Payer: Self-pay | Admitting: Internal Medicine

## 2019-08-05 ENCOUNTER — Other Ambulatory Visit: Payer: Self-pay | Admitting: *Deleted

## 2019-08-05 DIAGNOSIS — K219 Gastro-esophageal reflux disease without esophagitis: Secondary | ICD-10-CM

## 2019-08-05 MED ORDER — PANTOPRAZOLE SODIUM 40 MG PO TBEC: 40.0000 mg | DELAYED_RELEASE_TABLET | Freq: Every day | ORAL | 3 refills | Status: DC

## 2019-09-10 ENCOUNTER — Other Ambulatory Visit: Payer: Self-pay | Admitting: Sports Medicine

## 2019-09-10 DIAGNOSIS — F329 Major depressive disorder, single episode, unspecified: Secondary | ICD-10-CM

## 2019-09-10 MED ORDER — VENLAFAXINE HCL ER 150 MG PO TB24: 1.0000 | ORAL_TABLET | Freq: Every day | ORAL | 1 refills | Status: DC

## 2019-09-10 NOTE — Telephone Encounter (Signed)
Patient spouse is aware and did not have any questions.

## 2019-09-10 NOTE — Telephone Encounter (Signed)
Patient spouse is calling for a refill on her Effexor. I see she was seen in June. Kristopher Oppenheim on file is correct. Please advise.

## 2019-09-17 ENCOUNTER — Other Ambulatory Visit: Payer: Self-pay

## 2019-09-17 DIAGNOSIS — K219 Gastro-esophageal reflux disease without esophagitis: Secondary | ICD-10-CM

## 2019-09-17 DIAGNOSIS — E782 Mixed hyperlipidemia: Secondary | ICD-10-CM

## 2019-09-17 MED ORDER — LEVOTHYROXINE SODIUM 75 MCG PO TABS: 75.0000 ug | ORAL_TABLET | Freq: Every day | ORAL | 1 refills | Status: DC

## 2019-09-17 MED ORDER — ATORVASTATIN CALCIUM 40 MG PO TABS: 40.0000 mg | ORAL_TABLET | Freq: Every day | ORAL | 3 refills | Status: DC

## 2019-09-17 MED ORDER — PANTOPRAZOLE SODIUM 40 MG PO TBEC: 40.0000 mg | DELAYED_RELEASE_TABLET | Freq: Every day | ORAL | 3 refills | Status: DC

## 2019-09-17 MED ORDER — TRIAMTERENE-HCTZ 37.5-25 MG PO CAPS: 1.0000 | ORAL_CAPSULE | Freq: Every day | ORAL | 1 refills | Status: DC

## 2019-10-18 ENCOUNTER — Other Ambulatory Visit: Payer: Self-pay | Admitting: Sports Medicine

## 2019-10-18 MED ORDER — ELIQUIS 5 MG PO TABS: 5.0000 mg | ORAL_TABLET | Freq: Two times a day (BID) | ORAL | 3 refills | Status: DC

## 2019-11-28 ENCOUNTER — Other Ambulatory Visit: Payer: Self-pay | Admitting: *Deleted

## 2019-11-28 MED ORDER — TRIAMTERENE-HCTZ 37.5-25 MG PO CAPS: 1.0000 | ORAL_CAPSULE | Freq: Every day | ORAL | 1 refills | Status: DC

## 2020-01-20 ENCOUNTER — Ambulatory Visit (INDEPENDENT_AMBULATORY_CARE_PROVIDER_SITE_OTHER): Payer: Medicare Other | Admitting: Sports Medicine

## 2020-01-20 ENCOUNTER — Encounter: Payer: Self-pay | Admitting: Sports Medicine

## 2020-01-20 ENCOUNTER — Other Ambulatory Visit: Payer: Self-pay

## 2020-01-20 DIAGNOSIS — M4807 Spinal stenosis, lumbosacral region: Secondary | ICD-10-CM | POA: Diagnosis not present

## 2020-01-20 DIAGNOSIS — M48061 Spinal stenosis, lumbar region without neurogenic claudication: Secondary | ICD-10-CM

## 2020-01-20 MED ORDER — TRAMADOL HCL 50 MG PO TABS: 50.0000 mg | ORAL_TABLET | Freq: Three times a day (TID) | ORAL | 0 refills | Status: DC | PRN

## 2020-01-20 MED ORDER — GABAPENTIN 300 MG PO CAPS: ORAL_CAPSULE | ORAL | 3 refills | Status: DC

## 2020-01-20 NOTE — Assessment & Plan Note (Signed)
Returns, she continues to have severe and intractable low back pain, with radiation down both legs, particularly at night. Because she has failed several months of conservative measures including oral medications we are going to proceed with an MRI. Adding tramadol as needed for pain, gabapentin at bedtime. Return to go over MRI results for epidural planning.

## 2020-01-20 NOTE — Progress Notes (Signed)
    Procedures performed today:    None.  Independent interpretation of notes and tests performed by another provider:   None.  Impression and Recommendations:    Lumbar spinal stenosis Returns, she continues to have severe and intractable low back pain, with radiation down both legs, particularly at night. Because she has failed several months of conservative measures including oral medications we are going to proceed with an MRI. Adding tramadol as needed for pain, gabapentin at bedtime. Return to go over MRI results for epidural planning.    ___________________________________________ [name]. [name], M.D., ABFM., CAQSM. Primary Care and [place] Instructor of [place] [facility]

## 2020-01-27 ENCOUNTER — Other Ambulatory Visit: Payer: Self-pay | Admitting: Sports Medicine

## 2020-01-27 MED ORDER — TRIAZOLAM 0.25 MG PO TABS: ORAL_TABLET | ORAL | 0 refills | Status: DC

## 2020-02-08 ENCOUNTER — Other Ambulatory Visit: Payer: Self-pay

## 2020-02-08 ENCOUNTER — Ambulatory Visit (INDEPENDENT_AMBULATORY_CARE_PROVIDER_SITE_OTHER): Payer: Medicare Other

## 2020-02-08 DIAGNOSIS — M4807 Spinal stenosis, lumbosacral region: Secondary | ICD-10-CM

## 2020-02-08 DIAGNOSIS — M48061 Spinal stenosis, lumbar region without neurogenic claudication: Secondary | ICD-10-CM | POA: Diagnosis not present

## 2020-02-20 ENCOUNTER — Ambulatory Visit: Payer: Medicare Other | Admitting: Medical-Surgical

## 2020-03-11 ENCOUNTER — Encounter: Payer: Self-pay | Admitting: Sports Medicine

## 2020-03-11 ENCOUNTER — Other Ambulatory Visit: Payer: Self-pay

## 2020-03-11 ENCOUNTER — Ambulatory Visit (INDEPENDENT_AMBULATORY_CARE_PROVIDER_SITE_OTHER): Payer: Medicare Other | Admitting: Sports Medicine

## 2020-03-11 VITALS — BP 128/78 | HR 65 | Ht 62.0 in | Wt 227.0 lb

## 2020-03-11 DIAGNOSIS — R739 Hyperglycemia, unspecified: Secondary | ICD-10-CM

## 2020-03-11 DIAGNOSIS — M48061 Spinal stenosis, lumbar region without neurogenic claudication: Secondary | ICD-10-CM

## 2020-03-11 DIAGNOSIS — I10 Essential (primary) hypertension: Secondary | ICD-10-CM

## 2020-03-11 DIAGNOSIS — E782 Mixed hyperlipidemia: Secondary | ICD-10-CM | POA: Diagnosis not present

## 2020-03-11 DIAGNOSIS — E039 Hypothyroidism, unspecified: Secondary | ICD-10-CM

## 2020-03-11 MED ORDER — TRAMADOL HCL 50 MG PO TABS: 50.0000 mg | ORAL_TABLET | Freq: Three times a day (TID) | ORAL | 2 refills | Status: DC | PRN

## 2020-03-11 MED ORDER — GABAPENTIN 600 MG PO TABS: 600.0000 mg | ORAL_TABLET | Freq: Three times a day (TID) | ORAL | 3 refills | Status: DC

## 2020-03-11 MED ORDER — LEVOTHYROXINE SODIUM 75 MCG PO TABS: 75.0000 ug | ORAL_TABLET | Freq: Every day | ORAL | 1 refills | Status: DC

## 2020-03-11 NOTE — Assessment & Plan Note (Signed)
Refilling levothyroxine, rechecking labs.

## 2020-03-11 NOTE — Assessment & Plan Note (Addendum)
Rechecking lipids, patient is fasting today.  Other than triglycerides her labs look pretty darn good, was she truly fasting?  If she was truly fasting I am going to add Vascepa.  Adding Vascepa, recheck fasting lipids in 3 months.

## 2020-03-11 NOTE — Progress Notes (Addendum)
    Procedures performed today:    None.  Independent interpretation of notes and tests performed by another provider:   None.  Brief History, Exam, Impression, and Recommendations:    Lumbar spinal stenosis [name] returns, she has low back pain, much better with low-dose gabapentin 3 times daily and tramadol. Her MRI looked pretty darn good, she has only very mild degenerative changes but overall her back looks good for somebody that is almost 69 years old. We are going to continue nonoperative treatment, I did explain the pathophysiology of mild lumbar degenerative changes and myofascial pain syndrome. Refilling tramadol, increasing gabapentin to 600 mg 3 times daily. Return to see me in a month, we will consider bilateral SI joint injections if no better, if these do not work we will set her up for epidurals.  Hypothyroidism Refilling levothyroxine, rechecking labs.  Mixed hyperlipidemia Rechecking lipids, patient is fasting today.  Other than triglycerides her labs look pretty darn good, was she truly fasting?  If she was truly fasting I am going to add Vascepa.  Adding Vascepa, recheck fasting lipids in 3 months.  Benign essential hypertension Beautifully controlled on recheck, no changes in medication.    ___________________________________________ Dawn Her. [name], M.D., ABFM., CAQSM. Primary Care and [place] Instructor of [place] [facility]

## 2020-03-11 NOTE — Assessment & Plan Note (Signed)
[name] returns, she has low back pain, much better with low-dose gabapentin 3 times daily and tramadol. Her MRI looked pretty darn good, she has only very mild degenerative changes but overall her back looks good for somebody that is almost 69 years old. We are going to continue nonoperative treatment, I did explain the pathophysiology of mild lumbar degenerative changes and myofascial pain syndrome. Refilling tramadol, increasing gabapentin to 600 mg 3 times daily. Return to see me in a month, we will consider bilateral SI joint injections if no better, if these do not work we will set her up for epidurals.

## 2020-03-11 NOTE — Assessment & Plan Note (Signed)
Beautifully controlled on recheck, no changes in medication.

## 2020-03-12 LAB — COMPLETE METABOLIC PANEL WITH GFR
AG Ratio: 1.8 (calc) (ref 1.0–2.5)
ALT: 31 U/L — ABNORMAL HIGH (ref 6–29)
AST: 39 U/L — ABNORMAL HIGH (ref 10–35)
Albumin: 4.2 g/dL (ref 3.6–5.1)
Alkaline phosphatase (APISO): 113 U/L (ref 37–153)
BUN: 15 mg/dL (ref 7–25)
CO2: 30 mmol/L (ref 20–32)
Calcium: 10 mg/dL (ref 8.6–10.4)
Chloride: 100 mmol/L (ref 98–110)
Creat: 0.92 mg/dL (ref 0.50–0.99)
GFR, Est African American: 74 mL/min/{1.73_m2} (ref >=60)
GFR, Est Non African American: 64 mL/min/{1.73_m2} (ref >=60)
Globulin: 2.3 g/dL (calc) (ref 1.9–3.7)
Glucose, Bld: 122 mg/dL — ABNORMAL HIGH (ref 65–99)
Potassium: 4.4 mmol/L (ref 3.5–5.3)
Sodium: 139 mmol/L (ref 135–146)
Total Bilirubin: 0.7 mg/dL (ref 0.2–1.2)
Total Protein: 6.5 g/dL (ref 6.1–8.1)

## 2020-03-12 LAB — CBC
HCT: 41.7 % (ref 35.0–45.0)
Hemoglobin: 13.9 g/dL (ref 11.7–15.5)
MCH: 31 pg (ref 27.0–33.0)
MCHC: 33.3 g/dL (ref 32.0–36.0)
MCV: 92.9 fL (ref 80.0–100.0)
MPV: 9.3 fL (ref 7.5–12.5)
Platelets: 241 10*3/uL (ref 140–400)
RBC: 4.49 10*6/uL (ref 3.80–5.10)
RDW: 13.6 % (ref 11.0–15.0)
WBC: 6.1 10*3/uL (ref 3.8–10.8)

## 2020-03-12 LAB — LIPID PANEL W/REFLEX DIRECT LDL
Cholesterol: 130 mg/dL (ref <200)
HDL: 51 mg/dL (ref >=50)
LDL Cholesterol (Calc): 49 mg/dL (calc)
Non-HDL Cholesterol (Calc): 79 mg/dL (calc) (ref <130)
Total CHOL/HDL Ratio: 2.5 (calc) (ref <5.0)
Triglycerides: 259 mg/dL — ABNORMAL HIGH (ref <150)

## 2020-03-12 LAB — HEMOGLOBIN A1C
Hgb A1c MFr Bld: 5.5 % of total Hgb (ref <5.7)
Mean Plasma Glucose: 111 (calc)
eAG (mmol/L): 6.2 (calc)

## 2020-03-12 LAB — TSH: TSH: 2.55 mIU/L (ref 0.40–4.50)

## 2020-03-15 MED ORDER — ICOSAPENT ETHYL 1 G PO CAPS: 2.0000 g | ORAL_CAPSULE | Freq: Two times a day (BID) | ORAL | 3 refills | Status: DC

## 2020-03-15 NOTE — Addendum Note (Signed)
Addended by: Silverio Decamp on: 03/15/2020 09:53 AM   Modules accepted: Orders

## 2020-03-23 ENCOUNTER — Other Ambulatory Visit: Payer: Self-pay | Admitting: Sports Medicine

## 2020-03-23 DIAGNOSIS — F329 Major depressive disorder, single episode, unspecified: Secondary | ICD-10-CM

## 2020-04-07 ENCOUNTER — Telehealth: Payer: Self-pay | Admitting: Sports Medicine

## 2020-04-07 ENCOUNTER — Encounter: Payer: Self-pay | Admitting: Nurse Practitioner

## 2020-04-07 ENCOUNTER — Ambulatory Visit (INDEPENDENT_AMBULATORY_CARE_PROVIDER_SITE_OTHER): Payer: Medicare Other | Admitting: Nurse Practitioner

## 2020-04-07 ENCOUNTER — Other Ambulatory Visit: Payer: Self-pay

## 2020-04-07 ENCOUNTER — Ambulatory Visit (INDEPENDENT_AMBULATORY_CARE_PROVIDER_SITE_OTHER): Payer: Medicare Other

## 2020-04-07 DIAGNOSIS — R29810 Facial weakness: Secondary | ICD-10-CM | POA: Diagnosis not present

## 2020-04-07 MED ORDER — VALACYCLOVIR HCL 1 G PO TABS: 1000.0000 mg | ORAL_TABLET | Freq: Three times a day (TID) | ORAL | 0 refills | Status: DC

## 2020-04-07 MED ORDER — PREDNISONE 20 MG PO TABS: 60.0000 mg | ORAL_TABLET | Freq: Every day | ORAL | 0 refills | Status: AC

## 2020-04-07 NOTE — Telephone Encounter (Signed)
Patient stated to speak with sara and that she missed call. Thanks

## 2020-04-07 NOTE — Patient Instructions (Addendum)
I will call you as soon as we have the results back and let you know our next plan of action.   ADDENDUM:  Your head CT does not show any signs of bleeding or any new changes that could be responsible for your new symptoms.   I feel that your symptoms could possibly be Lalita Ebel onset of Shingles (Herpes Zoster) or a mild form of Bells Palsy. I would like to go ahead and start treatment for both of these today, but also put in a referral to neurology to try to have you seen by the end of this week. They may choose to do a MRI if your symptoms have not improved with the treatment to make sure that there is not a small clot inside your brain.   I do think the chance of a clot is low since you are on medication to thin your blood and your recent vital signs were terrific. Your most recent cholesterol was also great. With that being said, there is always a chance that a clot could be present.   I would like you to continue to watch for signs of stroke that include: Headache Weakness Difficulty Speaking Difficulty Walking or moving your arms and legs Confusion Seizures Difficulty swallowing Vision changes  If you develop any of the above symptoms, please go to the emergency room immediately.    Bell Palsy, Adult  Bell palsy is a short-term inability to move muscles in part of the face. The inability to move (paralysis) results from inflammation or compression of the facial nerve, which travels along the skull and under the ear to the side of the face (7th cranial nerve). This nerve is responsible for facial movements that include blinking, closing the eyes, smiling, and frowning. What are the causes? The exact cause of this condition is not known. It may be caused by an infection from a virus, such as the chickenpox (herpes zoster), Epstein-Barr, or mumps virus. What increases the risk? You are more likely to develop this condition if:  You are pregnant.  You have diabetes.  You have had a  recent infection in your nose, throat, or airways (upper respiratory infection).  You have a weakened body defense system (immune system).  You have had a facial injury, such as a fracture.  You have a family history of Bell palsy. What are the signs or symptoms? Symptoms of this condition include:  Weakness on one side of the face.  Drooping eyelid and corner of the mouth.  Excessive tearing in one eye.  Difficulty closing the eyelid.  Dry eye.  Drooling.  Dry mouth.  Changes in taste.  Change in facial appearance.  Pain behind one ear.  Ringing in one or both ears.  Sensitivity to sound in one ear.  Facial twitching.  Headache.  Impaired speech.  Dizziness.  Difficulty eating or drinking. Most of the time, only one side of the face is affected. Rarely, Bell palsy affects the whole face. How is this diagnosed? This condition is diagnosed based on:  Your symptoms.  Your medical history.  A physical exam. You may also have to see health care providers who specialize in disorders of the nerves (neurologist) or diseases and conditions of the eye (ophthalmologist). You may have tests, such as:  A test to check for nerve damage (electromyogram).  Imaging studies, such as CT or MRI scans.  Blood tests. How is this treated? This condition affects every person differently. Sometimes symptoms go away without treatment  within a couple weeks. If treatment is needed, it varies from person to person. The goal of treatment is to reduce inflammation and protect the eye from damage. Treatment for Bell palsy may include:  Medicines, such as: ? Steroids to reduce swelling and inflammation. ? Antiviral drugs. ? Pain relievers, including aspirin, acetaminophen, or ibuprofen.  Eye drops or ointment to keep your eye moist.  Eye protection, if you cannot close your eye.  Exercises or massage to regain muscle strength and function (physical therapy). Follow these  instructions at home:   Take over-the-counter and prescription medicines only as told by your health care provider.  If your eye is affected: ? Keep your eye moist with eye drops or ointment as told by your health care provider. ? Follow instructions for eye care and protection as told by your health care provider.  Do any physical therapy exercises as told by your health care provider.  Keep all follow-up visits as told by your health care provider. This is important. Contact a health care provider if:  You have a fever.  Your symptoms do not get better within 2-3 weeks, or your symptoms get worse.  Your eye is red, irritated, or painful.  You have new symptoms. Get help right away if:  You have weakness or numbness in a part of your body other than your face.  You have trouble swallowing.  You develop neck pain or stiffness.  You develop dizziness or shortness of breath. Summary  Bell palsy is a short-term inability to move muscles in part of the face. The inability to move (paralysis) results from inflammation or compression of the facial nerve.  This condition affects every person differently. Sometimes symptoms go away without treatment within a couple weeks.  If treatment is needed, it varies from person to person. The goal of treatment is to reduce inflammation and protect the eye from damage.  Contact your health care provider if your symptoms do not get better within 2-3 weeks, or your symptoms get worse. This information is not intended to replace advice given to you by your health care provider. Make sure you discuss any questions you have with your health care provider. Document Revised: 09/29/2017 Document Reviewed: 12/20/2016 Elsevier Patient Education  2020 [place], which is also known as herpes zoster, is an infection that causes a painful skin rash and fluid-filled blisters. It is caused by a virus. Shingles only develops in  people who:  Have had chickenpox.  Have been given a medicine to protect against chickenpox (have been vaccinated). Shingles is rare in this group. What are the causes? Shingles is caused by varicella-zoster virus (VZV). This is the same virus that causes chickenpox. After a person is exposed to VZV, the virus stays in the body in an inactive (dormant) state. Shingles develops if the virus is reactivated. This can happen many years after the first (initial) exposure to VZV. It is not known what causes this virus to be reactivated. What increases the risk? People who have had chickenpox or received the chickenpox vaccine are at risk for shingles. Shingles infection is more common in people who:  Are older than age 31.  Have a weakened disease-fighting system (immune system), such as people with: ? HIV. ? AIDS. ? Cancer.  Are taking medicines that weaken the immune system, such as transplant medicines.  Are experiencing a lot of stress. What are the signs or symptoms? Chelsie Burel symptoms of this condition include itching,  tingling, and pain in an area on your skin. Pain may be described as burning, stabbing, or throbbing. A few days or weeks after Christinamarie Tall symptoms start, a painful red rash appears. The rash is usually on one side of the body and has a band-like or belt-like pattern. The rash eventually turns into fluid-filled blisters that break open, change into scabs, and dry up in about 2-3 weeks. At any time during the infection, you may also develop:  A fever.  Chills.  A headache.  An upset stomach. How is this diagnosed? This condition is diagnosed with a skin exam. Skin or fluid samples may be taken from the blisters before a diagnosis is made. These samples are examined under a microscope or sent to a lab for testing. How is this treated? The rash may last for several weeks. There is not a specific cure for this condition. Your health care provider will probably prescribe medicines  to help you manage pain, recover more quickly, and avoid long-term problems. Medicines may include:  Antiviral drugs.  Anti-inflammatory drugs.  Pain medicines.  Anti-itching medicines (antihistamines). If the area involved is on your face, you may be referred to a specialist, such as an eye doctor (ophthalmologist) or an ear, nose, and throat (ENT) doctor (otolaryngologist) to help you avoid eye problems, chronic pain, or disability. Follow these instructions at home: Medicines  Take over-the-counter and prescription medicines only as told by your health care provider.  Apply an anti-itch cream or numbing cream to the affected area as told by your health care provider. Relieving itching and discomfort   Apply cold, wet cloths (cold compresses) to the area of the rash or blisters as told by your health care provider.  Cool baths can be soothing. Try adding baking soda or dry oatmeal to the water to reduce itching. Do not bathe in hot water. Blister and rash care  Keep your rash covered with a loose bandage (dressing). Wear loose-fitting clothing to help ease the pain of material rubbing against the rash.  Keep your rash and blisters clean by washing the area with mild soap and cool water as told by your health care provider.  Check your rash every day for signs of infection. Check for: ? More redness, swelling, or pain. ? Fluid or blood. ? Warmth. ? Pus or a bad smell.  Do not scratch your rash or pick at your blisters. To help avoid scratching: ? Keep your fingernails clean and cut short. ? Wear gloves or mittens while you sleep, if scratching is a problem. General instructions  Rest as told by your health care provider.  Keep all follow-up visits as told by your health care provider. This is important.  Wash your hands often with soap and water. If soap and water are not available, use hand sanitizer. Doing this lowers your chance of getting a bacterial skin  infection.  Before your blisters change into scabs, your shingles infection can cause chickenpox in people who have never had it or have never been vaccinated against it. To prevent this from happening, avoid contact with other people, especially: ? Babies. ? Pregnant women. ? Children who have eczema. ? Elderly people who have transplants. ? People who have chronic illnesses, such as cancer or AIDS. Contact a health care provider if:  Your pain is not relieved with prescribed medicines.  Your pain does not get better after the rash heals.  You have signs of infection in the rash area, such as: ? More  redness, swelling, or pain around the rash. ? Fluid or blood coming from the rash. ? The rash area feeling warm to the touch. ? Pus or a bad smell coming from the rash. Get help right away if:  The rash is on your face or nose.  You have facial pain, pain around your eye area, or loss of feeling on one side of your face.  You have difficulty seeing.  You have ear pain or have ringing in your ear.  You have a loss of taste.  Your condition gets worse. Summary  Shingles, which is also known as herpes zoster, is an infection that causes a painful skin rash and fluid-filled blisters.  This condition is diagnosed with a skin exam. Skin or fluid samples may be taken from the blisters and examined before the diagnosis is made.  Keep your rash covered with a loose bandage (dressing). Wear loose-fitting clothing to help ease the pain of material rubbing against the rash.  Before your blisters change into scabs, your shingles infection can cause chickenpox in people who have never had it or have never been vaccinated against it. This information is not intended to replace advice given to you by your health care provider. Make sure you discuss any questions you have with your health care provider. Document Revised: 02/08/2019 Document Reviewed: 06/21/2017 Elsevier Patient Education   2020 [place] Syndrome  Ramsay Hunt syndrome (RHS) is a viral infection that affects the nerves in the face and the nerves near the inner ear. The infection is caused by the varicella zoster virus (VZV). This is the same virus that causes chickenpox and shingles. After a person has chickenpox, this virus may become inactive. Years later, the virus can become active again and cause Ramsay Hunt syndrome. The trigger may be something that weakens the body's defense system (immune system), like stress. When VZV becomes activated, it moves up the facial nerve and causes a painful rash in or around the ear canal. It may also travel up the nerve that is responsible for hearing. Ramsay Hunt syndrome cannot be passed from person to person (it is not contagious). However, if a person who has never had chickenpox comes in contact with fluid from someone's skin blisters, he or she may develop chickenpox. What are the causes? This condition is caused by the varicella zoster virus. What increases the risk? You may be at risk for Ramsay Hunt syndrome if you have had chickenpox in the past. What are the signs or symptoms? Symptoms of this condition include:  A rash with blisters that breaks out around the ear. The rash may be accompanied by: ? A rash in the inner ear, along the side of the face, or up the scalp. ? A rash inside the mouth. ? Deep and severe pain in the ear. ? Severe, burning pain wherever the rash develops.  Facial nerve weakness. This may cause: ? Drooping on one side of the face. ? Inability to close the eyelid on the affected side of the face. ? Trouble eating. ? Loss of ability to taste on the side of the tongue. If RHS affects the inner ear nerve (auditory nerve), other symptoms may be present. These may include:  Hearing loss.  A spinning sensation (vertigo).  Clumsiness.  Ringing in the ear (tinnitus). How is this diagnosed? This condition may be diagnosed  based on:  Your symptoms.  A physical exam.  Other tests to confirm the diagnosis. These may include: ?  Viral culture. This test is done by swabbing the rash or blister to check for VZV. ? Blood tests to check for antibodies to VZV. Antibodies are proteins that your body produces in response to germs. ? Nerve conduction studies (electroneurogram). ? MRI scan. ? Hearing tests (audiology). How is this treated? This condition may be treated with:  An antiviral medicine to treat the virus.  NSAIDs or prescription pain relievers to control pain.  An anti-inflammatory medicine (steroid) called prednisone.  Antibiotic medicine, if the rash becomes infected. If treatment starts within the first 3 days of having symptoms, it will shorten the course of the pain and rash that is caused by RHS. Treatment will also prevent your facial nerve from continuing to weaken. Without treatment, it is possible that you may not recover full use of your facial nerve. Follow these instructions at home: Medicines  Take over-the-counter and prescription medicines only as told by your health care provider.  If you were prescribed an antibiotic or antiviral medicine, take or apply it as told by your health care provider. Do not stop using the antibiotic or antiviral medicine even if your condition improves.  Do not drive or use heavy machinery while taking prescription pain medicine.  If you are taking prescription pain medicine, take actions to prevent or treat constipation. Your health care provider may recommend that you: ? Drink enough fluid to keep your urine pale yellow. ? Eat foods that are high in fiber, such as fresh fruits and vegetables, whole grains, and beans. ? Limit foods that are high in fat and processed sugars, such as fried or sweet foods. ? Take an over-the-counter or prescription medicine for constipation. General instructions  If told by your health care provider, use artificial tears  and wear an eye patch to protect your eye until you can close your eyelid again.  Do not scratch or pick at the rash.  Put a cold, wet cloth (cold compress) on the itchy area as told by your health care provider.  If you have blisters in your mouth, do not eat or drink spicy, salty, or acidic things. Soft, bland, and cold foods and beverages are easiest to swallow.  Keep all follow-up visits as told by your health care provider. This is important. Contact a health care provider if:  Your pain medicine is not helping.  You have chills or fever.  Your symptoms get worse.  Your symptoms have not gone away after 2 weeks.  You have any changes in vision. Summary  Ramsay Hunt syndrome (RHS) is a viral infection that affects the nerves in the face and the nerves near the inner ear. The infection is caused by the varicella zoster virus (VZV), which also causes chicken pox and shingles.  After a person has chickenpox, this virus may become inactive. If the inactive VZV virus becomes activated, it moves up the facial nerve and causes a painful rash in or around the ear canal. It can also cause hearing loss and facial nerve weakness, with drooping on one side of the face.  If treatment starts within the first 3 days of having symptoms, it will shorten the course of the pain and rash that are caused by RHS. Treatment will also prevent your facial nerve from continuing to weaken.  Treatments may include antiviral, steroid, and pain medicines. This information is not intended to replace advice given to you by your health care provider. Make sure you discuss any questions you have with your health care  provider. Document Revised: 12/13/2018 Document Reviewed: 11/30/2017 Elsevier Patient Education  2020 [employer].

## 2020-04-07 NOTE — Telephone Encounter (Signed)
[name] spoke with patient.

## 2020-04-07 NOTE — Progress Notes (Addendum)
Acute Office Visit  Subjective:    Patient ID: [name], female    DOB: Feb 06, 1951, 69 y.o.   MRN: [id number]  Chief Complaint  Patient presents with   Facial Drop    onset:yesterday, right sided facial drop, feels like her tongue wants to protrude, thought it may be anxiety related but it is still happening, numbness in lips    HPI Patient is in today for sudden onset of right sided facial changes that she first noticed yesterday around 1:00pm. She reports her daughter questioned why her eye was drooping on the right side. She had also noticed some changes with the feeling in her lips and mouth. She reports that her tongue and lips felt like she had "been at the dentist" with numbness on the right side. She reports that she does have a history of Afib and TIA for which she has had an ablation and takes Eliquis daily.   She denies any other symptoms including extremity weakness, vision changes, difficulty speaking, difficulty hearing, difficulty walking, difficulty swallowing, difficulty understanding, changes in mentation, or loss of consciousness. She denies any recent head injury.   She does endorse recent increase in stressors and anxiety. She is unsure if this could be related.   Past Medical History:  Diagnosis Date   Anxiety    Atrial fibrillation (HCC)    Depression    GERD (gastroesophageal reflux disease)    Hypertension    Plaque psoriasis    Thyroid disease     Past Surgical History:  Procedure Laterality Date   ORIF ANKLE FRACTURE      Family History  Problem Relation Age of Onset   Hypertension Mother    Hypertension Father    Cancer Father        colon   Cancer Daughter        daughter   Cancer Son        skin   Cancer Maternal Aunt        breast   Hypertension Maternal Grandmother    Hypertension Maternal Grandfather    Heart attack Maternal Grandfather    Hypertension Paternal Grandmother    Stroke Paternal Grandfather     Cancer Maternal Aunt        breast    Social History   Socioeconomic History   Marital status: Married    Spouse name: Not on file   Number of children: Not on file   Years of education: Not on file   Highest education level: Not on file  Occupational History   Not on file  Tobacco Use   Smoking status: Never Smoker   Smokeless tobacco: Never Used  Substance and Sexual Activity   Alcohol use: Never   Drug use: Never   Sexual activity: Not on file  Other Topics Concern   Not on file  Social History Narrative   Not on file   Social Determinants of Health   Financial Resource Strain:    Difficulty of Paying Living Expenses:   Food Insecurity:    Worried About Charity fundraiser in the Last Year:    Arboriculturist in the Last Year:   Transportation Needs:    Film/video editor (Medical):    Lack of Transportation (Non-Medical):   Physical Activity:    Days of Exercise per Week:    Minutes of Exercise per Session:   Stress:    Feeling of Stress :   Social Connections:  Frequency of Communication with Friends and Family:    Frequency of Social Gatherings with Friends and Family:    Attends Religious Services:    Active Member of Clubs or Organizations:    Attends Music therapist:    Marital Status:   Intimate Partner Violence:    Fear of Current or Ex-Partner:    Emotionally Abused:    Physically Abused:    Sexually Abused:     Outpatient Medications Prior to Visit  Medication Sig Dispense Refill   acetaminophen (TYLENOL) 650 MG CR tablet Take 1 tablet (650 mg total) by mouth every 8 (eight) hours as needed for pain. 90 tablet 3   atorvastatin (LIPITOR) 40 MG tablet Take 1 tablet (40 mg total) by mouth daily at 6 PM. 90 tablet 3   ELIQUIS 5 MG TABS tablet Take 1 tablet (5 mg total) by mouth 2 (two) times daily. 180 tablet 3   flecainide (TAMBOCOR) 50 MG tablet Take 50 mg by mouth 2 (two) times a day.      gabapentin (NEURONTIN) 600 MG tablet Take 1 tablet (600 mg total) by mouth 3 (three) times daily. 270 tablet 3   icosapent Ethyl (VASCEPA) 1 g capsule Take 2 capsules (2 g total) by mouth 2 (two) times daily. 120 capsule 3   levothyroxine (SYNTHROID) 75 MCG tablet Take 1 tablet (75 mcg total) by mouth daily before breakfast. 90 tablet 1   metoprolol succinate (TOPROL-XL) 50 MG 24 hr tablet Take 50 mg by mouth daily.     Multiple Vitamin tablet Take 1 tablet by mouth daily.     pantoprazole (PROTONIX) 40 MG tablet Take 1 tablet (40 mg total) by mouth daily. 90 tablet 3   traMADol (ULTRAM) 50 MG tablet Take 1 tablet (50 mg total) by mouth every 8 (eight) hours as needed for moderate pain. Maximum 6 tabs per day. 30 tablet 2   traZODone (DESYREL) 100 MG tablet TAKE 1 TABLET BY MOUTH EVERYDAY AT BEDTIME 90 tablet 2   triamterene-hydrochlorothiazide (DYAZIDE) 37.5-25 MG capsule Take 1 each (1 capsule total) by mouth daily. 90 capsule 1   venlafaxine XR (EFFEXOR-XR) 150 MG 24 hr capsule TAKE ONE CAPSULE BY MOUTH DAILY 90 capsule 0   No facility-administered medications prior to visit.    No Known Allergies  Review of Systems  Constitutional: Positive for fatigue. Negative for activity change, appetite change, chills and fever.       Chronic fatigue due to medications  HENT: Negative for congestion, drooling, ear pain, facial swelling, hearing loss, postnasal drip, rhinorrhea, sinus pressure, sinus pain, tinnitus, trouble swallowing and voice change.   Eyes: Negative for discharge, redness and visual disturbance.  Respiratory: Negative for cough, chest tightness and shortness of breath.   Cardiovascular: Negative for chest pain, palpitations and leg swelling.  Gastrointestinal: Negative for diarrhea, nausea and vomiting.  Musculoskeletal: Negative for gait problem.  Neurological: Positive for facial asymmetry. Negative for dizziness, tremors, seizures, syncope, speech difficulty, weakness,  light-headedness, numbness and headaches.  Psychiatric/Behavioral: Negative for agitation, confusion, decreased concentration, dysphoric mood, self-injury, sleep disturbance and suicidal ideas. The patient is nervous/anxious.        Objective:    Physical Exam Vitals and nursing note reviewed.  Constitutional:      Appearance: She is obese.  HENT:     Head: Normocephalic and atraumatic. No raccoon eyes, masses, right periorbital erythema or left periorbital erythema.     Jaw: No tenderness, swelling, pain on movement or malocclusion.  Salivary Glands: Right salivary gland is not diffusely enlarged or tender. Left salivary gland is not diffusely enlarged or tender.     Comments: Right eye lid and right sided mouth droop present.  Diminished nasolabial fold on the right side.  Absence of curvature of smile on the right side.  Tongue protrusion shows left sided deviation.  Diminished sensation to the left side of the upper and lower lip.  Eyelids fully approximate bilaterally.  Forehead sparing is present.  Sensation to soft and sharp touch equal bilaterally to the face and neck, with the exception of the lips. Head, shoulder, upper extremity, and lower extremity strength equal bilaterally.  PERRLA.      Mouth/Throat:     Mouth: Mucous membranes are moist.     Tongue: Tongue deviates from midline.     Pharynx: Oropharynx is clear. Uvula midline. No uvula swelling.  Eyes:     General: Vision grossly intact. Gaze aligned appropriately. No visual field deficit.    Extraocular Movements: Extraocular movements intact.     Conjunctiva/sclera: Conjunctivae normal.     Pupils: Pupils are equal, round, and reactive to light.     Visual Fields: Right eye visual fields normal and left eye visual fields normal.  Neck:     Vascular: No carotid bruit.  Cardiovascular:     Rate and Rhythm: Normal rate and regular rhythm.     Pulses: Normal pulses.     Heart sounds: Normal heart sounds. No  murmur.  Pulmonary:     Effort: Pulmonary effort is normal. No respiratory distress.     Breath sounds: Normal breath sounds. No wheezing.  Abdominal:     General: Abdomen is flat.     Palpations: Abdomen is soft.  Musculoskeletal:        General: Normal range of motion.     Cervical back: Normal range of motion. No rigidity or tenderness.  Lymphadenopathy:     Cervical: No cervical adenopathy.  Skin:    General: Skin is warm and dry.     Capillary Refill: Capillary refill takes less than 2 seconds.     Comments: No rash or vesicular lesions present on the neck, scalp, or face  Neurological:     General: No focal deficit present.     Mental Status: She is alert and oriented to person, place, and time.     Cranial Nerves: Cranial nerve deficit and facial asymmetry present. No dysarthria.     Sensory: Sensation is intact. No sensory deficit.     Motor: Motor function is intact. No weakness, tremor, abnormal muscle tone or pronator drift.     Coordination: Coordination is intact. Romberg sign negative. Coordination normal. Finger-Nose-Finger Test and Heel to [facility] Test normal.     Gait: Gait is intact. Gait normal.     Deep Tendon Reflexes: Reflexes are normal and symmetric.     Comments: Sensation to sharp and light touch decreased on the right side of the lips.  Psychiatric:        Attention and Perception: Attention and perception normal.        Mood and Affect: Mood and affect normal.        Speech: Speech normal.        Behavior: Behavior normal. Behavior is cooperative.        Thought Content: Thought content normal.        Cognition and Memory: Cognition and memory normal.        Judgment: Judgment normal.  BP 135/84    Pulse 66    Temp 97.8 F (36.6 C) (Oral)    Ht 5\' 2"  (1.575 m)    Wt 232 lb 11.2 oz (105.6 kg)    SpO2 95%    BMI 42.56 kg/m  Wt Readings from Last 3 Encounters:  04/07/20 232 lb 11.2 oz (105.6 kg)  03/11/20 227 lb (103 kg)  01/20/20 223 lb (101.2 kg)     There are no preventive care reminders to display for this patient.  There are no preventive care reminders to display for this patient.   Lab Results  Component Value Date   TSH 2.55 03/11/2020   Lab Results  Component Value Date   WBC 6.1 03/11/2020   HGB 13.9 03/11/2020   HCT 41.7 03/11/2020   MCV 92.9 03/11/2020   PLT 241 03/11/2020   Lab Results  Component Value Date   NA 139 03/11/2020   K 4.4 03/11/2020   CO2 30 03/11/2020   GLUCOSE 122 (H) 03/11/2020   BUN 15 03/11/2020   CREATININE 0.92 03/11/2020   BILITOT 0.7 03/11/2020   ALKPHOS 81 04/23/2007   AST 39 (H) 03/11/2020   ALT 31 (H) 03/11/2020   PROT 6.5 03/11/2020   ALBUMIN 3.2 (L) 04/23/2007   CALCIUM 10.0 03/11/2020   Lab Results  Component Value Date   CHOL 130 03/11/2020   Lab Results  Component Value Date   HDL 51 03/11/2020   Lab Results  Component Value Date   LDLCALC 49 03/11/2020   Lab Results  Component Value Date   TRIG 259 (H) 03/11/2020   Lab Results  Component Value Date   CHOLHDL 2.5 03/11/2020   Lab Results  Component Value Date   HGBA1C 5.5 03/11/2020       Assessment & Plan:   1. Facial droop Right sided facial droop/palsy with unknown etiology. Differential diagnoses include Bells Palsy, CVA, or Herpes Zoster (Ramsay Hunt Syndrome). Given that the patient is on Eliqius, an acute bleed must be ruled out with a STAT CT without contrast. She is no longer in Afib, however, an ischemic stroke is still a possibility. Given the length of time since the onset of symptoms, she is out of the window for emergent anticoagulation, therefore we will not pursue a STAT MRI at this time, but may be needed as a non emergent diagnostic tool. Discussed emergency symptoms that would warrant immediate evaluation in the emergency department with the patient and her husband.   STAT Head CT without contrast: Completed- reveals no acute or focal abnormalities present.   PLAN: -Treat with 7  days prednisone burst and valacyclovir empirically for Bells Palsy and Herpes Zoster. -Urgent referral to Neurology for further evaluation- if we can have her seen this week, they can evaluate whether a non-emergent MRI is necessary considering it would take this long to get her in for MRI with [place]. If this takes longer and the patient is still exhibiting symptoms with treatment, we will re-approach this option.  -If symptoms worsen, if you begin to develop any emergency symptoms (weakness, speech difficulty, altered mental status, dizziness, swallowing difficulty, numbness, severe headache, or inability to move your extremities) go to the emergency room for urgent evaluation.   - CT Head Wo Contrast - Ambulatory referral to Neurology - predniSONE (DELTASONE) 20 MG tablet; Take 3 tablets (60 mg total) by mouth daily for 7 days.  Dispense: 21 tablet; Refill: 0 - valACYclovir (VALTREX) 1000 MG tablet; Take 1 tablet (1,000  mg total) by mouth 3 (three) times daily.  Dispense: 21 tablet; Refill: 0  Patient called and updated with plan over the telephone after the CT results received. We discussed emergency symptoms and the plan of care listed above. Patient agrees with the treatment and reports that she agrees with conservative measures at this time and to go to the emergency room if she begins to experience any emergency symptoms.  Orma Render, NP

## 2020-04-08 ENCOUNTER — Ambulatory Visit: Payer: Medicare Other | Admitting: Sports Medicine

## 2020-04-15 ENCOUNTER — Ambulatory Visit: Payer: Medicare Other | Admitting: Sports Medicine

## 2020-05-29 ENCOUNTER — Ambulatory Visit (INDEPENDENT_AMBULATORY_CARE_PROVIDER_SITE_OTHER): Payer: Medicare Other | Admitting: Sports Medicine

## 2020-05-29 ENCOUNTER — Ambulatory Visit (INDEPENDENT_AMBULATORY_CARE_PROVIDER_SITE_OTHER): Payer: Medicare Other

## 2020-05-29 ENCOUNTER — Other Ambulatory Visit: Payer: Self-pay

## 2020-05-29 ENCOUNTER — Encounter: Payer: Self-pay | Admitting: Sports Medicine

## 2020-05-29 DIAGNOSIS — F329 Major depressive disorder, single episode, unspecified: Secondary | ICD-10-CM

## 2020-05-29 DIAGNOSIS — M170 Bilateral primary osteoarthritis of knee: Secondary | ICD-10-CM | POA: Diagnosis not present

## 2020-05-29 MED ORDER — VENLAFAXINE HCL ER 75 MG PO CP24: 225.0000 mg | ORAL_CAPSULE | Freq: Every day | ORAL | 3 refills | Status: DC

## 2020-05-29 NOTE — Assessment & Plan Note (Signed)
Increasing depression, adding behavioral therapy, increasing Effexor to 225 mg, return in 1 month to 6 weeks for this.

## 2020-05-29 NOTE — Progress Notes (Signed)
    Procedures performed today:    Procedure: Real-time Ultrasound Guided injection of the left knee Device: Samsung HS60  Verbal informed consent obtained.  Time-out conducted.  Noted no overlying erythema, induration, or other signs of local infection.  Skin prepped in a sterile fashion.  Local anesthesia: Topical Ethyl chloride.  With sterile technique and under real time ultrasound guidance: 1 cc Kenalog 40, 2 cc lidocaine, 2 cc bupivacaine injected easily Completed without difficulty  Pain immediately resolved suggesting accurate placement of the medication.  Advised to call if fevers/chills, erythema, induration, drainage, or persistent bleeding.  Images permanently stored and available for review in the ultrasound unit.  Impression: Technically successful ultrasound guided injection.  Procedure: Real-time Ultrasound Guided injection of the right knee Device: Samsung HS60  Verbal informed consent obtained.  Time-out conducted.  Noted no overlying erythema, induration, or other signs of local infection.  Skin prepped in a sterile fashion.  Local anesthesia: Topical Ethyl chloride.  With sterile technique and under real time ultrasound guidance: 1 cc Kenalog 40, 2 cc lidocaine, 2 cc bupivacaine injected easily Completed without difficulty  Pain immediately resolved suggesting accurate placement of the medication.  Advised to call if fevers/chills, erythema, induration, drainage, or persistent bleeding.  Images permanently stored and available for review in the ultrasound unit.  Impression: Technically successful ultrasound guided injection.  Independent interpretation of notes and tests performed by another provider:   None.  Brief History, Exam, Impression, and Recommendations:    Depression, major Increasing depression, adding behavioral therapy, increasing Effexor to 225 mg, return in 1 month to 6 weeks for this.  Primary osteoarthritis of both knees Worsening  bilateral knee pain, obese, 69 years old, this is likely osteoarthritis. And bilateral knee x-rays, bilateral injections today, return to see me in 4 to 6 weeks. We can do her back at a future visit.    ___________________________________________ [name]. [name], M.D., ABFM., CAQSM. Primary Care and [place] Instructor of [employer] [facility] [employer]

## 2020-05-29 NOTE — Assessment & Plan Note (Signed)
Worsening bilateral knee pain, obese, 69 years old, this is likely osteoarthritis. And bilateral knee x-rays, bilateral injections today, return to see me in 4 to 6 weeks. We can do her back at a future visit.

## 2020-06-23 ENCOUNTER — Ambulatory Visit: Payer: Medicare Other | Admitting: Diagnostic Neuroimaging

## 2020-06-24 ENCOUNTER — Telehealth: Payer: Self-pay

## 2020-06-24 NOTE — Telephone Encounter (Signed)
Pt's husband called on behalf of pt. Per spouse, pt is having covid like symptoms. Requesting pt to be tested. Attempted to contact pt directly, no answer. Left a vm msg for pt to return a call back to schedule a virtual appointment for an evaluation. Direct call back info provided.

## 2020-06-25 ENCOUNTER — Other Ambulatory Visit: Payer: Self-pay | Admitting: Infectious Diseases

## 2020-06-25 ENCOUNTER — Other Ambulatory Visit: Payer: Self-pay | Admitting: Sports Medicine

## 2020-06-25 ENCOUNTER — Telehealth (INDEPENDENT_AMBULATORY_CARE_PROVIDER_SITE_OTHER): Payer: Medicare Other | Admitting: Family Medicine

## 2020-06-25 ENCOUNTER — Telehealth: Payer: Self-pay | Admitting: Infectious Diseases

## 2020-06-25 ENCOUNTER — Encounter: Payer: Self-pay | Admitting: Family Medicine

## 2020-06-25 VITALS — BP 158/90

## 2020-06-25 DIAGNOSIS — I10 Essential (primary) hypertension: Secondary | ICD-10-CM

## 2020-06-25 DIAGNOSIS — U071 COVID-19: Secondary | ICD-10-CM | POA: Diagnosis not present

## 2020-06-25 DIAGNOSIS — I482 Chronic atrial fibrillation, unspecified: Secondary | ICD-10-CM

## 2020-06-25 NOTE — Telephone Encounter (Signed)
Called to Discuss with patient about Covid symptoms and the use of the monoclonal antibody infusion for those with mild to moderate Covid symptoms and at a high risk of hospitalization.     Pt appears to qualify for this infusion due to co-morbid conditions and/or a member of an at-risk group in accordance with the FDA Emergency Use Authorization.    Sx started 8/20.    [name], MSN, NP-C [facility] for Infectious Disease [email or web address] Pager: [phone] Office: [phone] [place]: [phone]

## 2020-06-25 NOTE — Progress Notes (Signed)
I connected by phone with [name] on 06/25/2020 at 6:58 PM to discuss the potential use of a new treatment for mild to moderate COVID-19 viral infection in non-hospitalized patients.  This patient is a 70 y.o. female that meets the FDA criteria for Emergency Use Authorization of COVID monoclonal antibody casirivimab/imdevimab.  Has a (+) direct SARS-CoV-2 viral test result  Has mild or moderate COVID-19   Is NOT hospitalized due to COVID-19  Is within 10 days of symptom onset  Has at least one of the high risk factor(s) for progression to severe COVID-19 and/or hospitalization as defined in EUA.  Specific high risk criteria : Older age (>/= 70 yo) and Cardiovascular disease or hypertension   I have spoken and communicated the following to the patient or parent/caregiver regarding COVID monoclonal antibody treatment:  1. FDA has authorized the emergency use for the treatment of mild to moderate COVID-19 in adults and pediatric patients with positive results of direct SARS-CoV-2 viral testing who are 55 years of age and older weighing at least 40 kg, and who are at high risk for progressing to severe COVID-19 and/or hospitalization.  2. The significant known and potential risks and benefits of COVID monoclonal antibody, and the extent to which such potential risks and benefits are unknown.  3. Information on available alternative treatments and the risks and benefits of those alternatives, including clinical trials.  4. Patients treated with COVID monoclonal antibody should continue to self-isolate and use infection control measures (e.g., wear mask, isolate, social distance, avoid sharing personal items, clean and disinfect "high touch" surfaces, and frequent handwashing) according to CDC guidelines.   5. The patient or parent/caregiver has the option to accept or refuse COVID monoclonal antibody treatment.  After reviewing this information with the patient, The patient agreed to  proceed with receiving casirivimab\imdevimab infusion and will be provided a copy of the Fact sheet prior to receiving the infusion. [name] 06/25/2020 6:58 PM

## 2020-06-25 NOTE — Progress Notes (Signed)
Virtual Visit via Video Note  I connected with [name] on 06/25/20 at  2:20 PM EDT by a video enabled telemedicine application and verified that I am speaking with the correct person using two identifiers.   I discussed the limitations of evaluation and management by telemedicine and the availability of in person appointments. The patient expressed understanding and agreed to proceed.  Patient location: [facility] Provider location: in office  Subjective:    CC:   HPI: Patient says starting late Friday night into early Saturday morning she started not feeling well she started getting a severe headache with sweats and severe nasal congestion in fact she thought she had a severe sinus infection she has had some shortness of breath but no significant cough.  She has felt lightheaded and dizzy she is also been checking her blood pressures at home and reports that the been significantly elevated 185/109 is an example she currently takes transferring HCTZ metoprolol for high blood pressure.  She has been using Pepto-Bismol and Tylenol.  She said she finally did a home test kit she does not remember the brand, and it was positive so she actually went to [facility] and is awaiting her results for that test that should be back tomorrow.  She has had fevers and chills and sweats and some diarrhea.  She is also just felt extremely fatigued and had a really bad taste in her mouth.  She says her daughter was actually sick about 2 weeks ago with some type of stomach bug and she had a negative test at that time.  She has been vaccinated.   Husband was present for virtual visit.  Also encouraged him to get tested as well.  Past medical history, Surgical history, Family history not pertinant except as noted below, Social history, Allergies, and medications have been entered into the medical record, reviewed, and corrections made.   Review of Systems: No fevers, chills, night sweats, weight loss, chest pain, or  shortness of breath.   Objective:    General: Speaking clearly in complete sentences without any shortness of breath.  Alert and oriented x3.  Normal judgment. No apparent acute distress.    Impression and Recommendations:    No problem-specific Assessment & Plan notes found for this encounter.   COVID - 19-discussed symptomatic care.  Based on age and risk factors I think she would actually be a good candidate for monoclonal antibody infusion we will go ahead and place referral.  Home test positive but still awaiting confirmatory test at [facility].  Encouraged them to try to get a home pulse oximeter if at all possible.  Sure hydrating well avoid salty foods.  Focus on eating bland diet especially with the diarrhea and hydrating well.   Uncontrolled hypertension-okay to take an extra tab of metoprolol daily as needed for elevated blood pressures in fact encouraged her to take it today.    Time spent in encounter 22 minutes  I discussed the assessment and treatment plan with the patient. The patient was provided an opportunity to ask questions and all were answered. The patient agreed with the plan and demonstrated an understanding of the instructions.   The patient was advised to call back or seek an in-person evaluation if the symptoms worsen or if the condition fails to improve as anticipated.   Beatrice Lecher, MD

## 2020-06-26 ENCOUNTER — Ambulatory Visit (HOSPITAL_COMMUNITY)
Admission: RE | Admit: 2020-06-26 | Discharge: 2020-06-26 | Disposition: A | Discharge status: Home / Self Care | Payer: Medicare Other | Source: Ambulatory Visit | Attending: Pulmonary Disease | Admitting: Pulmonary Disease

## 2020-06-26 DIAGNOSIS — Z23 Encounter for immunization: Secondary | ICD-10-CM | POA: Diagnosis not present

## 2020-06-26 DIAGNOSIS — I482 Chronic atrial fibrillation, unspecified: Secondary | ICD-10-CM | POA: Diagnosis present

## 2020-06-26 DIAGNOSIS — U071 COVID-19: Secondary | ICD-10-CM | POA: Diagnosis present

## 2020-06-26 DIAGNOSIS — I10 Essential (primary) hypertension: Secondary | ICD-10-CM | POA: Diagnosis present

## 2020-06-26 MED ORDER — DIPHENHYDRAMINE HCL 50 MG/ML IJ SOLN: 50.0000 mg | Freq: Once | INTRAMUSCULAR | Status: DC | PRN

## 2020-06-26 MED ORDER — SODIUM CHLORIDE 0.9 % IV SOLN
1200.0000 mg | Freq: Once | INTRAVENOUS | Status: AC
  Administered 2020-06-26: 1200 mg via INTRAVENOUS
  Filled 2020-06-26: qty 10

## 2020-06-26 MED ORDER — ALBUTEROL SULFATE HFA 108 (90 BASE) MCG/ACT IN AERS: 2.0000 | INHALATION_SPRAY | Freq: Once | RESPIRATORY_TRACT | Status: DC | PRN

## 2020-06-26 MED ORDER — METHYLPREDNISOLONE SODIUM SUCC 125 MG IJ SOLR: 125.0000 mg | Freq: Once | INTRAMUSCULAR | Status: DC | PRN

## 2020-06-26 MED ORDER — FAMOTIDINE IN NACL 20-0.9 MG/50ML-% IV SOLN: 20.0000 mg | Freq: Once | INTRAVENOUS | Status: DC | PRN

## 2020-06-26 MED ORDER — EPINEPHRINE 0.3 MG/0.3ML IJ SOAJ: 0.3000 mg | Freq: Once | INTRAMUSCULAR | Status: DC | PRN

## 2020-06-26 MED ORDER — SODIUM CHLORIDE 0.9 % IV SOLN: INTRAVENOUS | Status: DC | PRN

## 2020-06-26 NOTE — Progress Notes (Signed)
  Diagnosis: COVID-19  Physician:Dr. Asencion Noble  Procedure: [facility] Infusion Clinic Med: casirivimab\imdevimab infusion - Provided patient with casirivimab\imdevimab fact sheet for patients, parents and caregivers prior to infusion.  Complications: No immediate complications noted.  Discharge: Discharged home   Sanjuana Mae 06/26/2020

## 2020-06-26 NOTE — Discharge Instructions (Signed)

## 2020-07-08 ENCOUNTER — Other Ambulatory Visit: Payer: Self-pay | Admitting: Sports Medicine

## 2020-07-08 ENCOUNTER — Ambulatory Visit: Payer: Medicare Other | Admitting: Sports Medicine

## 2020-07-10 ENCOUNTER — Ambulatory Visit: Payer: Medicare Other | Admitting: Sports Medicine

## 2020-07-21 ENCOUNTER — Ambulatory Visit: Payer: Medicare Other | Admitting: Sports Medicine

## 2020-09-10 ENCOUNTER — Telehealth: Payer: Self-pay | Admitting: Sports Medicine

## 2020-09-10 DIAGNOSIS — E782 Mixed hyperlipidemia: Secondary | ICD-10-CM

## 2020-09-10 NOTE — Telephone Encounter (Signed)
Mr Fritsche called. He scheduled an appt for physical for Mrs.Curiale and would like lab Order sent down for November 15th.  Thank you.

## 2020-09-11 NOTE — Telephone Encounter (Signed)
Left message on [name] VM that orders had been placed.

## 2020-09-11 NOTE — Telephone Encounter (Signed)
Orders placed.

## 2020-09-16 ENCOUNTER — Ambulatory Visit: Payer: Medicare Other | Admitting: Sports Medicine

## 2020-09-16 ENCOUNTER — Other Ambulatory Visit: Payer: Self-pay | Admitting: Sports Medicine

## 2020-09-16 DIAGNOSIS — E039 Hypothyroidism, unspecified: Secondary | ICD-10-CM

## 2020-09-16 LAB — CBC
HCT: 39.8 % (ref 35.0–45.0)
Hemoglobin: 13.9 g/dL (ref 11.7–15.5)
MCH: 32.1 pg (ref 27.0–33.0)
MCHC: 34.9 g/dL (ref 32.0–36.0)
MCV: 91.9 fL (ref 80.0–100.0)
MPV: 9.8 fL (ref 7.5–12.5)
Platelets: 222 10*3/uL (ref 140–400)
RBC: 4.33 10*6/uL (ref 3.80–5.10)
RDW: 12.7 % (ref 11.0–15.0)
WBC: 6.9 10*3/uL (ref 3.8–10.8)

## 2020-09-16 LAB — COMPREHENSIVE METABOLIC PANEL
AG Ratio: 1.9 (calc) (ref 1.0–2.5)
ALT: 29 U/L (ref 6–29)
AST: 40 U/L — ABNORMAL HIGH (ref 10–35)
Albumin: 4.2 g/dL (ref 3.6–5.1)
Alkaline phosphatase (APISO): 86 U/L (ref 37–153)
BUN: 16 mg/dL (ref 7–25)
CO2: 30 mmol/L (ref 20–32)
Calcium: 9.9 mg/dL (ref 8.6–10.4)
Chloride: 102 mmol/L (ref 98–110)
Creat: 0.86 mg/dL (ref 0.60–0.93)
Globulin: 2.2 g/dL (calc) (ref 1.9–3.7)
Glucose, Bld: 110 mg/dL — ABNORMAL HIGH (ref 65–99)
Potassium: 3.8 mmol/L (ref 3.5–5.3)
Sodium: 140 mmol/L (ref 135–146)
Total Bilirubin: 0.5 mg/dL (ref 0.2–1.2)
Total Protein: 6.4 g/dL (ref 6.1–8.1)

## 2020-09-16 LAB — LIPID PANEL
Cholesterol: 126 mg/dL (ref <200)
HDL: 52 mg/dL (ref >=50)
LDL Cholesterol (Calc): 46 mg/dL (calc)
Non-HDL Cholesterol (Calc): 74 mg/dL (calc) (ref <130)
Total CHOL/HDL Ratio: 2.4 (calc) (ref <5.0)
Triglycerides: 221 mg/dL — ABNORMAL HIGH (ref <150)

## 2020-09-16 LAB — TSH: TSH: 2.33 mIU/L (ref 0.40–4.50)

## 2020-10-01 ENCOUNTER — Other Ambulatory Visit: Payer: Self-pay | Admitting: Sports Medicine

## 2020-10-01 DIAGNOSIS — M48061 Spinal stenosis, lumbar region without neurogenic claudication: Secondary | ICD-10-CM

## 2020-10-06 ENCOUNTER — Ambulatory Visit (INDEPENDENT_AMBULATORY_CARE_PROVIDER_SITE_OTHER): Payer: Medicare Other | Admitting: Sports Medicine

## 2020-10-06 ENCOUNTER — Other Ambulatory Visit: Payer: Self-pay

## 2020-10-06 DIAGNOSIS — M48061 Spinal stenosis, lumbar region without neurogenic claudication: Secondary | ICD-10-CM

## 2020-10-06 DIAGNOSIS — F329 Major depressive disorder, single episode, unspecified: Secondary | ICD-10-CM

## 2020-10-06 DIAGNOSIS — Z79899 Other long term (current) drug therapy: Secondary | ICD-10-CM | POA: Diagnosis not present

## 2020-10-06 DIAGNOSIS — R195 Other fecal abnormalities: Secondary | ICD-10-CM | POA: Diagnosis not present

## 2020-10-06 DIAGNOSIS — E782 Mixed hyperlipidemia: Secondary | ICD-10-CM | POA: Diagnosis not present

## 2020-10-06 DIAGNOSIS — I10 Essential (primary) hypertension: Secondary | ICD-10-CM

## 2020-10-06 DIAGNOSIS — I482 Chronic atrial fibrillation, unspecified: Secondary | ICD-10-CM

## 2020-10-06 MED ORDER — VALSARTAN-HYDROCHLOROTHIAZIDE 160-25 MG PO TABS: 1.0000 | ORAL_TABLET | Freq: Every day | ORAL | 3 refills | Status: DC

## 2020-10-06 MED ORDER — VENLAFAXINE HCL ER 150 MG PO CP24: 150.0000 mg | ORAL_CAPSULE | Freq: Every day | ORAL | 3 refills | Status: DC

## 2020-10-06 NOTE — Assessment & Plan Note (Signed)
[name] is asked to decrease her antidepressant dose, dropping to 150 mg of Effexor daily.  She will let me know if she develops increasing anxiety, pain, or depressive symptoms.

## 2020-10-06 NOTE — Assessment & Plan Note (Signed)
Flecainide can also cause significant fatigue, however this is crucial for her current atrial fibrillation, metoprolol, Eliquis need to be left alone as well.

## 2020-10-06 NOTE — Assessment & Plan Note (Signed)
Pleasant 68 year old female, her visit today was in the hopes of decreasing some of her medication burden. As below we are able to remove some of her medications. Ultimate goal is quality of life.

## 2020-10-06 NOTE — Progress Notes (Signed)
    Procedures performed today:    None.  Independent interpretation of notes and tests performed by another provider:   None.  Brief History, Exam, Impression, and Recommendations:    Polypharmacy Pleasant 69 year old female, her visit today was in the hopes of decreasing some of her medication burden. As below we are able to remove some of her medications. Ultimate goal is quality of life.  Positive colorectal cancer screening using Cologuard test Audre did have a positive Cologuard test and never got her colonoscopy, I am going to place another referral to Dr. Bryan Lemma.  Lumbar spinal stenosis Roots back pain improved considerably with gabapentin 600 mg 3 times daily, tramadol. She also has Effexor. She is getting excessively tired so we will go ahead and discontinue her gabapentin per her request. I am happy to give her a little more tramadol for use as needed. Certainly we could try SI joint injections if continues to have discomfort with the above regimen.  Mixed hyperlipidemia Nazli was interested in discontinuing some of her medications but as she does desire quality of life and understands a stroke or heart attack would decrease this quality of life she will continue her Vascepa and atorvastatin.  Chronic atrial fibrillation Flecainide can also cause significant fatigue, however this is crucial for her current atrial fibrillation, metoprolol, Eliquis need to be left alone as well.  Benign essential hypertension Blood pressure looks okay, but I am going to switch her from triamterene/hydrochlorothiazide to valsartan/hydrochlorothiazide.  Depression, major Haylyn is asked to decrease her antidepressant dose, dropping to 150 mg of Effexor daily.  She will let me know if she develops increasing anxiety, pain, or depressive symptoms.    ___________________________________________ [name]. [name], M.D., ABFM., CAQSM. Primary Care and [place] Instructor of [place] of [facility] of Medicine

## 2020-10-06 NOTE — Assessment & Plan Note (Signed)
Roots back pain improved considerably with gabapentin 600 mg 3 times daily, tramadol. She also has Effexor. She is getting excessively tired so we will go ahead and discontinue her gabapentin per her request. I am happy to give her a little more tramadol for use as needed. Certainly we could try SI joint injections if continues to have discomfort with the above regimen.

## 2020-10-06 NOTE — Assessment & Plan Note (Signed)
[name] did have a positive Cologuard test and never got her colonoscopy, I am going to place another referral to Dr. Bryan Lemma.

## 2020-10-06 NOTE — Assessment & Plan Note (Signed)
Blood pressure looks okay, but I am going to switch her from triamterene/hydrochlorothiazide to valsartan/hydrochlorothiazide.

## 2020-10-06 NOTE — Assessment & Plan Note (Signed)
[name] was interested in discontinuing some of her medications but as she does desire quality of life and understands a stroke or heart attack would decrease this quality of life she will continue her Vascepa and atorvastatin.

## 2020-10-16 ENCOUNTER — Encounter: Payer: Self-pay | Admitting: Gastroenterology

## 2020-11-03 ENCOUNTER — Ambulatory Visit: Payer: Medicare Other | Admitting: Sports Medicine

## 2020-11-03 ENCOUNTER — Other Ambulatory Visit: Payer: Self-pay | Admitting: Sports Medicine

## 2020-11-03 DIAGNOSIS — K219 Gastro-esophageal reflux disease without esophagitis: Secondary | ICD-10-CM

## 2020-11-12 ENCOUNTER — Other Ambulatory Visit: Payer: Self-pay | Admitting: Sports Medicine

## 2020-11-12 DIAGNOSIS — E782 Mixed hyperlipidemia: Secondary | ICD-10-CM

## 2020-11-23 ENCOUNTER — Telehealth (INDEPENDENT_AMBULATORY_CARE_PROVIDER_SITE_OTHER): Payer: Medicare Other | Admitting: Sports Medicine

## 2020-11-23 DIAGNOSIS — Z79899 Other long term (current) drug therapy: Secondary | ICD-10-CM

## 2020-11-23 DIAGNOSIS — F329 Major depressive disorder, single episode, unspecified: Secondary | ICD-10-CM | POA: Diagnosis not present

## 2020-11-23 DIAGNOSIS — I10 Essential (primary) hypertension: Secondary | ICD-10-CM

## 2020-11-23 DIAGNOSIS — R195 Other fecal abnormalities: Secondary | ICD-10-CM

## 2020-11-23 DIAGNOSIS — M48061 Spinal stenosis, lumbar region without neurogenic claudication: Secondary | ICD-10-CM

## 2020-11-23 MED ORDER — VALSARTAN-HYDROCHLOROTHIAZIDE 320-25 MG PO TABS: 1.0000 | ORAL_TABLET | Freq: Every day | ORAL | 3 refills | Status: DC

## 2020-11-23 NOTE — Assessment & Plan Note (Signed)
[name] was excessively fatigued on gabapentin 600 3 times daily, we decreased her dose to 600 mg nightly, her pain is adequately controlled, she is no longer fatigued during the day, and she understands we can certainly try SI joint injections if she have any flares in her pain.

## 2020-11-23 NOTE — Progress Notes (Signed)
   Virtual Visit via Telephone   I connected with  [name]  on 11/23/20 by telephone/telehealth and verified that I am speaking with the correct person using two identifiers.   I discussed the limitations, risks, security and privacy concerns of performing an evaluation and management service by telephone, including the higher likelihood of inaccurate diagnosis and treatment, and the availability of in person appointments.  We also discussed the likely need of an additional face to face encounter for complete and high quality delivery of care.  I also discussed with the patient that there may be a patient responsible charge related to this service. The patient expressed understanding and wishes to proceed.  Provider location is in medical facility. Patient location is at their home, different from provider location. People involved in care of the patient during this telehealth encounter were myself, my nurse/medical assistant, and my front office/scheduling team member.  Review of Systems: No fevers, chills, night sweats, weight loss, chest pain, or shortness of breath.   Objective Findings:    General: Speaking full sentences, no audible heavy breathing.  Sounds alert and appropriately interactive.    Independent interpretation of tests performed by another provider:   None.  Brief History, Exam, Impression, and Recommendations:    Benign essential hypertension Blood pressure still continues to run high, increasing to 320/25 valsartan/HCTZ, they will touch base with me in about 2 weeks and we can add amlodipine if needed. Goal is less than 140/90.  Depression, major We decreased her antidepressant dose to 150 of Effexor, she feels like symptoms are very well controlled, no changes.  Lumbar spinal stenosis [name] was excessively fatigued on gabapentin 600 3 times daily, we decreased her dose to 600 mg nightly, her pain is adequately controlled, she is no longer fatigued during the  day, and she understands we can certainly try SI joint injections if she have any flares in her pain.  Polypharmacy [name] was taken a lot of medications, she did have polypharmacy and we decreased her medication burden at the last visit by decreasing her gabapentin and her symptoms during the day improved considerably. Ultimate goal is quality of life.  Positive colorectal cancer screening using Cologuard test [name] would like a referral in [place] rather than [place] or [employer]. Switching to digestive health specialist   I discussed the above assessment and treatment plan with the patient. The patient was provided an opportunity to ask questions and all were answered. The patient agreed with the plan and demonstrated an understanding of the instructions.   The patient was advised to call back or seek an in-person evaluation if the symptoms worsen or if the condition fails to improve as anticipated.   I provided 30 minutes of face to face and non-face-to-face time during this encounter date, time was needed to gather information, review chart, records, communicate/coordinate with staff remotely, as well as complete documentation.   ___________________________________________ [name]. [name], M.D., ABFM., CAQSM. Primary Care and Sports Medicine [facility]  Adjunct Professor of [place] of [facility] of Medicine

## 2020-11-23 NOTE — Assessment & Plan Note (Signed)
Blood pressure still continues to run high, increasing to 320/25 valsartan/HCTZ, they will touch base with me in about 2 weeks and we can add amlodipine if needed. Goal is less than 140/90.

## 2020-11-23 NOTE — Assessment & Plan Note (Signed)
We decreased her antidepressant dose to 150 of Effexor, she feels like symptoms are very well controlled, no changes.

## 2020-11-23 NOTE — Assessment & Plan Note (Signed)
[name] would like a referral in [place] rather than  or [employer]. Switching to digestive health specialist

## 2020-11-23 NOTE — Assessment & Plan Note (Signed)
[name] was taken a lot of medications, she did have polypharmacy and we decreased her medication burden at the last visit by decreasing her gabapentin and her symptoms during the day improved considerably. Ultimate goal is quality of life.

## 2020-11-30 ENCOUNTER — Ambulatory Visit: Payer: Medicare Other | Admitting: Gastroenterology

## 2020-12-24 ENCOUNTER — Other Ambulatory Visit: Payer: Self-pay | Admitting: Sports Medicine

## 2020-12-24 DIAGNOSIS — M48061 Spinal stenosis, lumbar region without neurogenic claudication: Secondary | ICD-10-CM

## 2021-01-08 ENCOUNTER — Telehealth (INDEPENDENT_AMBULATORY_CARE_PROVIDER_SITE_OTHER): Payer: Medicare Other | Admitting: Sports Medicine

## 2021-01-08 DIAGNOSIS — R059 Cough, unspecified: Secondary | ICD-10-CM

## 2021-01-08 MED ORDER — PREDNISONE 50 MG PO TABS: 50.0000 mg | ORAL_TABLET | Freq: Every day | ORAL | 0 refills | Status: DC

## 2021-01-08 MED ORDER — AZITHROMYCIN 250 MG PO TABS: ORAL_TABLET | ORAL | 0 refills | Status: DC

## 2021-01-08 NOTE — Assessment & Plan Note (Signed)
[name] is a pleasant 69 year old female with multiple medical problems including A. fib, hypertension, she has had several days of increasing cough, shortness of breath, wheezing, runny nose, sore throat, fullness facial and sinus pressure. No measured fevers, no chills. She does sound very hoarse on the phone today but she is speaking full sentences. She probably has an acute bronchitis +/- acute maxillary sinusitis. We will treat aggressively since I can examine her in the office, 5 days of prednisone, azithromycin, return to see me in the office if not better in a few days.

## 2021-01-08 NOTE — Progress Notes (Signed)
   Virtual Visit via Telephone   I connected with  [name]  on 01/08/21 by telephone/telehealth and verified that I am speaking with the correct person using two identifiers.   I discussed the limitations, risks, security and privacy concerns of performing an evaluation and management service by telephone, including the higher likelihood of inaccurate diagnosis and treatment, and the availability of in person appointments.  We also discussed the likely need of an additional face to face encounter for complete and high quality delivery of care.  I also discussed with the patient that there may be a patient responsible charge related to this service. The patient expressed understanding and wishes to proceed.  Provider location is in medical facility. Patient location is at their home, different from provider location. People involved in care of the patient during this telehealth encounter were myself, my nurse/medical assistant, and my front office/scheduling team member.  Review of Systems: No fevers, chills, night sweats, weight loss, chest pain, or shortness of breath.   Objective Findings:    General: Speaking full sentences, no audible heavy breathing.  Sounds alert and appropriately interactive.    Independent interpretation of tests performed by another provider:   None.  Brief History, Exam, Impression, and Recommendations:    Coughing [name] is a pleasant 70 year old female with multiple medical problems including A. fib, hypertension, she has had several days of increasing cough, shortness of breath, wheezing, runny nose, sore throat, fullness facial and sinus pressure. No measured fevers, no chills. She does sound very hoarse on the phone today but she is speaking full sentences. She probably has an acute bronchitis +/- acute maxillary sinusitis. We will treat aggressively since I can examine her in the office, 5 days of prednisone, azithromycin, return to see me in the office  if not better in a few days.  I discussed the above assessment and treatment plan with the patient. The patient was provided an opportunity to ask questions and all were answered. The patient agreed with the plan and demonstrated an understanding of the instructions.   The patient was advised to call back or seek an in-person evaluation if the symptoms worsen or if the condition fails to improve as anticipated.   I provided 30 minutes of face to face and non-face-to-face time during this encounter date, time was needed to gather information, review chart, records, communicate/coordinate with staff remotely, as well as complete documentation.   ___________________________________________ [name]. [name], M.D., ABFM., CAQSM. Primary Care and Sports Medicine [facility]  Adjunct Professor of [place] of [facility] of Medicine

## 2021-01-08 NOTE — Progress Notes (Signed)
Went out Wednesday and came home congested. Coughing up phlegm, headache, sinus congestion, yellow sinus drainage, cough til nearly vomiting. Tried tylenol cough and cold and claritin and benadryl with no relief. Denies fever.

## 2021-01-14 ENCOUNTER — Ambulatory Visit: Payer: Medicare Other | Admitting: Family Medicine

## 2021-01-28 ENCOUNTER — Other Ambulatory Visit: Payer: Self-pay | Admitting: Sports Medicine

## 2021-01-28 DIAGNOSIS — E782 Mixed hyperlipidemia: Secondary | ICD-10-CM

## 2021-02-15 ENCOUNTER — Other Ambulatory Visit: Payer: Self-pay | Admitting: Sports Medicine

## 2021-02-15 DIAGNOSIS — F329 Major depressive disorder, single episode, unspecified: Secondary | ICD-10-CM

## 2021-03-09 ENCOUNTER — Other Ambulatory Visit: Payer: Self-pay

## 2021-03-09 ENCOUNTER — Other Ambulatory Visit: Payer: Self-pay | Admitting: Sports Medicine

## 2021-03-09 ENCOUNTER — Encounter: Payer: Self-pay | Admitting: Sports Medicine

## 2021-03-09 ENCOUNTER — Ambulatory Visit (INDEPENDENT_AMBULATORY_CARE_PROVIDER_SITE_OTHER): Payer: Medicare Other | Admitting: Sports Medicine

## 2021-03-09 DIAGNOSIS — K219 Gastro-esophageal reflux disease without esophagitis: Secondary | ICD-10-CM

## 2021-03-09 DIAGNOSIS — I10 Essential (primary) hypertension: Secondary | ICD-10-CM

## 2021-03-09 DIAGNOSIS — M48061 Spinal stenosis, lumbar region without neurogenic claudication: Secondary | ICD-10-CM

## 2021-03-09 DIAGNOSIS — E782 Mixed hyperlipidemia: Secondary | ICD-10-CM

## 2021-03-09 DIAGNOSIS — F329 Major depressive disorder, single episode, unspecified: Secondary | ICD-10-CM

## 2021-03-09 DIAGNOSIS — E039 Hypothyroidism, unspecified: Secondary | ICD-10-CM

## 2021-03-09 LAB — POCT URINALYSIS DIP (CLINITEK)
Bilirubin, UA: NEGATIVE
Blood, UA: NEGATIVE
Glucose, UA: NEGATIVE mg/dL
Ketones, POC UA: NEGATIVE mg/dL
Nitrite, UA: NEGATIVE
POC PROTEIN,UA: NEGATIVE
Spec Grav, UA: >=1.03 — AB (ref 1.010–1.025)
Urobilinogen, UA: 0.2 E.U./dL
pH, UA: 5.5 (ref 5.0–8.0)

## 2021-03-09 MED ORDER — ICOSAPENT ETHYL 1 G PO CAPS: 2.0000 g | ORAL_CAPSULE | Freq: Two times a day (BID) | ORAL | 3 refills | Status: DC

## 2021-03-09 MED ORDER — GABAPENTIN 600 MG PO TABS: 600.0000 mg | ORAL_TABLET | Freq: Every day | ORAL | 3 refills | Status: DC

## 2021-03-09 MED ORDER — PANTOPRAZOLE SODIUM 40 MG PO TBEC: 1.0000 | DELAYED_RELEASE_TABLET | Freq: Every day | ORAL | 3 refills | Status: DC

## 2021-03-09 MED ORDER — LEVOTHYROXINE SODIUM 75 MCG PO TABS: 75.0000 ug | ORAL_TABLET | Freq: Every day | ORAL | 1 refills | Status: DC

## 2021-03-09 MED ORDER — VENLAFAXINE HCL ER 150 MG PO CP24: 150.0000 mg | ORAL_CAPSULE | Freq: Every day | ORAL | 3 refills | Status: DC

## 2021-03-09 MED ORDER — VALSARTAN-HYDROCHLOROTHIAZIDE 320-25 MG PO TABS: 1.0000 | ORAL_TABLET | Freq: Every day | ORAL | 3 refills | Status: DC

## 2021-03-09 MED ORDER — ATORVASTATIN CALCIUM 40 MG PO TABS: ORAL_TABLET | ORAL | 3 refills | Status: DC

## 2021-03-09 MED ORDER — FUROSEMIDE 40 MG PO TABS: 40.0000 mg | ORAL_TABLET | Freq: Every day | ORAL | 3 refills | Status: DC

## 2021-03-09 MED ORDER — METOPROLOL SUCCINATE ER 50 MG PO TB24
50.0000 mg | ORAL_TABLET | Freq: Every day | ORAL | 3 refills | Status: DC
Start: 2021-03-09 — End: 2022-03-01

## 2021-03-09 MED ORDER — ELIQUIS 5 MG PO TABS
5.0000 mg | ORAL_TABLET | Freq: Two times a day (BID) | ORAL | 3 refills | Status: DC
Start: 2021-03-09 — End: 2022-04-06

## 2021-03-09 MED ORDER — TRAZODONE HCL 100 MG PO TABS
100.0000 mg | ORAL_TABLET | Freq: Every day | ORAL | 3 refills | Status: DC
Start: 2021-03-09 — End: 2021-07-08

## 2021-03-09 NOTE — Progress Notes (Signed)
    Procedures performed today:    None.  Independent interpretation of notes and tests performed by another provider:   None.  Brief History, Exam, Impression, and Recommendations:    Lumbar spinal stenosis [name] returns, she has a remarkably normal for age lumbar spine MRI, she was excessively fatigued historically on 600 mg 3 times daily of gabapentin so we dropped her to 600 mg at night, initially pain was better however she is having a recurrence. We are to try to get some weight off of her with Lasix as she does have some fairly marked lower extremity edema, but if this does not help after 2 weeks I will inject both of her SI joints.  Benign essential hypertension Continues to be elevated on valsartan/HCTZ, adding Lasix 40 daily, return to see me in 2 weeks. At that juncture we will probably check her electrolytes again, and potentially BMP.    ___________________________________________ [name], M.D., ABFM., CAQSM. Primary Care and [place] Instructor of [place] of [facility] of Medicine

## 2021-03-09 NOTE — Assessment & Plan Note (Signed)
[name] returns, she has a remarkably normal for age lumbar spine MRI, she was excessively fatigued historically on 600 mg 3 times daily of gabapentin so we dropped her to 600 mg at night, initially pain was better however she is having a recurrence. We are to try to get some weight off of her with Lasix as she does have some fairly marked lower extremity edema, but if this does not help after 2 weeks I will inject both of her SI joints.

## 2021-03-09 NOTE — Assessment & Plan Note (Signed)
Continues to be elevated on valsartan/HCTZ, adding Lasix 40 daily, return to see me in 2 weeks. At that juncture we will probably check her electrolytes again, and potentially BMP.

## 2021-03-10 LAB — URINE CULTURE
MICRO NUMBER:: 11873248
SPECIMEN QUALITY:: ADEQUATE

## 2021-03-10 LAB — HOUSE ACCOUNT TRACKING

## 2021-03-23 ENCOUNTER — Ambulatory Visit: Payer: Medicare Other | Admitting: Sports Medicine

## 2021-03-26 ENCOUNTER — Other Ambulatory Visit: Payer: Self-pay | Admitting: Sports Medicine

## 2021-03-26 DIAGNOSIS — M48061 Spinal stenosis, lumbar region without neurogenic claudication: Secondary | ICD-10-CM

## 2021-05-31 ENCOUNTER — Telehealth: Payer: Self-pay

## 2021-05-31 DIAGNOSIS — M48061 Spinal stenosis, lumbar region without neurogenic claudication: Secondary | ICD-10-CM

## 2021-05-31 MED ORDER — GABAPENTIN 600 MG PO TABS: 600.0000 mg | ORAL_TABLET | Freq: Two times a day (BID) | ORAL | 3 refills | Status: DC

## 2021-05-31 NOTE — Telephone Encounter (Signed)
Patient spouse called in. Patient is out of Gabapentin as she has been taking 2 a day. She would like new prescription to take 2/day as this helps her.

## 2021-05-31 NOTE — Telephone Encounter (Signed)
Thank you, refilled.

## 2021-06-03 ENCOUNTER — Telehealth: Payer: Self-pay

## 2021-06-03 NOTE — Telephone Encounter (Signed)
[name] needs a handicap placard.

## 2021-06-03 NOTE — Telephone Encounter (Signed)
Printed and in T's box for signature

## 2021-06-04 NOTE — Telephone Encounter (Signed)
I will finish the paperwork when I get back

## 2021-06-08 ENCOUNTER — Encounter: Payer: Self-pay | Admitting: Family Medicine

## 2021-06-08 ENCOUNTER — Ambulatory Visit (INDEPENDENT_AMBULATORY_CARE_PROVIDER_SITE_OTHER): Payer: Medicare Other | Admitting: Family Medicine

## 2021-06-08 VITALS — BP 98/47 | HR 64 | Ht 62.0 in | Wt 233.0 lb

## 2021-06-08 DIAGNOSIS — F4321 Adjustment disorder with depressed mood: Secondary | ICD-10-CM

## 2021-06-08 DIAGNOSIS — K625 Hemorrhage of anus and rectum: Secondary | ICD-10-CM

## 2021-06-08 DIAGNOSIS — I959 Hypotension, unspecified: Secondary | ICD-10-CM | POA: Diagnosis not present

## 2021-06-08 DIAGNOSIS — M48061 Spinal stenosis, lumbar region without neurogenic claudication: Secondary | ICD-10-CM | POA: Diagnosis not present

## 2021-06-08 MED ORDER — AMBULATORY NON FORMULARY MEDICATION: 0 refills | Status: AC

## 2021-06-08 NOTE — Progress Notes (Signed)
Acute Office Visit  Subjective:    Patient ID: [name], female    DOB: [birth year], 70 y.o.   MRN: [id number]  Chief Complaint  Patient presents with   Hypotension     HPI Patient is in today for hypotension, grief, falling.   Patient's husband recently passed and funeral was this weekend. Son and daughter accompany her at visit today. They report she has been extremely sad and is having some unusual episodes of falling/knees-buckling, anxiety triggering her mouth to clench and become dry. She has had episodes of jerking, confusion, and low blood pressure readings at home. She has had no appetite and family is having to force her to eat and drink throughout the day. She is also reporting several days of blood in her stool. Family states at times when the anxiety worsens her speech seems slurred and she has trouble finding her words. Reports this happened for the majority of the day yesterday. She denies any chest pain, trouble breathing, incontinence, vision changes.  She is already scheduled to see PCP in 2 days, but family was very concerned about the low BP pressures and unusual symptoms she is having.    Past Medical History:  Diagnosis Date   Anxiety    Atrial fibrillation (HCC)    Depression    GERD (gastroesophageal reflux disease)    Hypertension    Plaque psoriasis    Thyroid disease     Past Surgical History:  Procedure Laterality Date   ORIF ANKLE FRACTURE      Family History  Problem Relation Age of Onset   Hypertension Mother    Hypertension Father    Cancer Father        colon   Cancer Daughter        daughter   Cancer Son        skin   Cancer Maternal Aunt        breast   Hypertension Maternal Grandmother    Hypertension Maternal Grandfather    Heart attack Maternal Grandfather    Hypertension Paternal Grandmother    Stroke Paternal Grandfather    Cancer Maternal Aunt        breast    Social History   Socioeconomic History   Marital  status: Married    Spouse name: Not on file   Number of children: Not on file   Years of education: Not on file   Highest education level: Not on file  Occupational History   Not on file  Tobacco Use   Smoking status: Never   Smokeless tobacco: Never  Substance and Sexual Activity   Alcohol use: Never   Drug use: Never   Sexual activity: Not on file  Other Topics Concern   Not on file  Social History Narrative   Not on file   Social Determinants of Health   Financial Resource Strain: Not on file  Food Insecurity: Not on file  Transportation Needs: Not on file  Physical Activity: Not on file  Stress: Not on file  Social Connections: Not on file  Intimate Partner Violence: Not on file    Outpatient Medications Prior to Visit  Medication Sig Dispense Refill   acetaminophen (TYLENOL) 650 MG CR tablet Take 1 tablet (650 mg total) by mouth every 8 (eight) hours as needed for pain. 90 tablet 3   atorvastatin (LIPITOR) 40 MG tablet TAKE 1 TABLET BY MOUTH EVERY EVENING AT 6 90 tablet 3   ELIQUIS 5 MG TABS  tablet Take 1 tablet (5 mg total) by mouth 2 (two) times daily. 180 tablet 3   flecainide (TAMBOCOR) 50 MG tablet Take 50 mg by mouth 2 (two) times a day.     furosemide (LASIX) 40 MG tablet Take 1 tablet (40 mg total) by mouth daily. 30 tablet 3   gabapentin (NEURONTIN) 600 MG tablet Take 1 tablet (600 mg total) by mouth 2 (two) times daily. 180 tablet 3   icosapent Ethyl (VASCEPA) 1 g capsule Take 2 capsules (2 g total) by mouth 2 (two) times daily. 360 capsule 3   levothyroxine (SYNTHROID) 75 MCG tablet Take 1 tablet (75 mcg total) by mouth daily before breakfast. 90 tablet 1   metoprolol succinate (TOPROL-XL) 50 MG 24 hr tablet Take 1 tablet (50 mg total) by mouth daily. 90 tablet 3   pantoprazole (PROTONIX) 40 MG tablet Take 1 tablet (40 mg total) by mouth daily. 90 tablet 3   traMADol (ULTRAM) 50 MG tablet TAKE ONE TABLET BY MOUTH EVERY 8 HOURS AS NEEDED FOR MODERATE PAIN 30  tablet 0   traZODone (DESYREL) 100 MG tablet Take 1 tablet (100 mg total) by mouth at bedtime. 90 tablet 3   valsartan-hydrochlorothiazide (DIOVAN-HCT) 320-25 MG tablet Take 1 tablet by mouth daily. 90 tablet 3   venlafaxine XR (EFFEXOR-XR) 150 MG 24 hr capsule Take 1 capsule (150 mg total) by mouth daily. 90 capsule 3   No facility-administered medications prior to visit.    No Known Allergies  Review of Systems All review of systems negative except what is listed in the HPI     Objective:    Physical Exam Vitals reviewed.  Constitutional:      Appearance: Normal appearance.  HENT:     Head: Normocephalic and atraumatic.  Cardiovascular:     Rate and Rhythm: Normal rate and regular rhythm.     Pulses: Normal pulses.     Heart sounds: Normal heart sounds.  Pulmonary:     Effort: Pulmonary effort is normal.     Breath sounds: Normal breath sounds.  Abdominal:     Palpations: Abdomen is soft.  Musculoskeletal:        General: Normal range of motion.     Cervical back: Normal range of motion and neck supple.     Comments: Minimal +1 BLE edema  Skin:    General: Skin is warm and dry.     Capillary Refill: Capillary refill takes less than 2 seconds.     Findings: No rash.  Neurological:     General: No focal deficit present.     Mental Status: She is alert. Mental status is at baseline.     Cranial Nerves: No cranial nerve deficit.     Sensory: No sensory deficit.     Motor: No weakness.     Coordination: Coordination normal.     Comments: Modified NIHSS negative  Psychiatric:        Mood and Affect: Mood normal.        Behavior: Behavior normal.        Thought Content: Thought content normal.        Judgment: Judgment normal.    BP (!) 98/47   Pulse 64   Ht '5\' 2"'$  (1.575 m)   Wt 233 lb (105.7 kg)   SpO2 94%   BMI 42.62 kg/m  Wt Readings from Last 3 Encounters:  06/08/21 233 lb (105.7 kg)  03/09/21 236 lb (107 kg)  05/29/20 (!) 229 lb (  103.9 kg)    Health  Maintenance Due  Topic Date Due   Zoster Vaccines- Shingrix (1 of 2) Never done   DEXA SCAN  Never done   COVID-19 Vaccine (3 - Pfizer risk series) 02/24/2020   MAMMOGRAM  04/18/2021    There are no preventive care reminders to display for this patient.   Lab Results  Component Value Date   TSH 2.33 09/15/2020   Lab Results  Component Value Date   WBC 6.9 09/15/2020   HGB 13.9 09/15/2020   HCT 39.8 09/15/2020   MCV 91.9 09/15/2020   PLT 222 09/15/2020   Lab Results  Component Value Date   NA 140 09/15/2020   K 3.8 09/15/2020   CO2 30 09/15/2020   GLUCOSE 110 (H) 09/15/2020   BUN 16 09/15/2020   CREATININE 0.86 09/15/2020   BILITOT 0.5 09/15/2020   ALKPHOS 81 04/23/2007   AST 40 (H) 09/15/2020   ALT 29 09/15/2020   PROT 6.4 09/15/2020   ALBUMIN 3.2 (L) 04/23/2007   CALCIUM 9.9 09/15/2020   Lab Results  Component Value Date   CHOL 126 09/15/2020   Lab Results  Component Value Date   HDL 52 09/15/2020   Lab Results  Component Value Date   LDLCALC 46 09/15/2020   Lab Results  Component Value Date   TRIG 221 (H) 09/15/2020   Lab Results  Component Value Date   CHOLHDL 2.4 09/15/2020   Lab Results  Component Value Date   HGBA1C 5.5 03/11/2020       Assessment & Plan:   1. Spinal stenosis of lumbar region without neurogenic claudication PCP signed for handicap tag and Rollator  - AMBULATORY NON FORMULARY MEDICATION; Medication Name: Rollator  Dispense: 1 each; Refill: 0  2. Hypotension, unspecified hypotension type Recommend she break her BP pills in half for the next 2 days and monitor BP at home. Will reassess at visit with PCP in 2 days. Blood work today to check for anemias, dehydration.  - CBC with Differential - COMPLETE METABOLIC PANEL WITH GFR  3. Grief Significant grief after losing husband. Referring for grief counseling. Currently on Effexor. PCP to assess again at next appointment.  - Ambulatory referral to [place]  4.  Rectal bleeding Labs today. Referral to GI - will change to urgent if anemic.  - CBC with Differential - COMPLETE METABOLIC PANEL WITH GFR - Ambulatory referral to Gastroenterology   Emotional support provided to patient and family today. Encouraged her to do her best to be sure to eat and drink throughout the day. Changes made above and PCP to reassess later this week. Patient and family aware of signs/symptoms requiring further/urgent evaluation.  Follow-up in 2 days with PCP as previously scheduled.   Consulted Dr. Darene Lamer during this visit.   Purcell Nails Olevia Bowens, DNP, FNP-C

## 2021-06-08 NOTE — Patient Instructions (Signed)
Cut all of your blood pressure pills in half (furosemide, metoprolol, valsartan-hydrochlorothiazide) Check BP at least once per day Hydrate and try to eat some throughout the day Go to the ED for any chest pain, difficulty breathing, one-sided weakness, facial droop, slurred speech Referral placed for GI and Counseling  Follow-up with Dr. Darene Lamer on Thursday

## 2021-06-09 LAB — CBC WITH DIFFERENTIAL/PLATELET
Absolute Monocytes: 496 cells/uL (ref 200–950)
Basophils Absolute: 30 cells/uL (ref 0–200)
Basophils Relative: 0.4 %
Eosinophils Absolute: 266 cells/uL (ref 15–500)
Eosinophils Relative: 3.6 %
HCT: 30.1 % — ABNORMAL LOW (ref 35.0–45.0)
Hemoglobin: 10.6 g/dL — ABNORMAL LOW (ref 11.7–15.5)
Lymphs Abs: 1665 cells/uL (ref 850–3900)
MCH: 33.4 pg — ABNORMAL HIGH (ref 27.0–33.0)
MCHC: 35.2 g/dL (ref 32.0–36.0)
MCV: 95 fL (ref 80.0–100.0)
MPV: 9.8 fL (ref 7.5–12.5)
Monocytes Relative: 6.7 %
Neutro Abs: 4943 cells/uL (ref 1500–7800)
Neutrophils Relative %: 66.8 %
Platelets: 250 10*3/uL (ref 140–400)
RBC: 3.17 10*6/uL — ABNORMAL LOW (ref 3.80–5.10)
RDW: 14.5 % (ref 11.0–15.0)
Total Lymphocyte: 22.5 %
WBC: 7.4 10*3/uL (ref 3.8–10.8)

## 2021-06-09 LAB — COMPLETE METABOLIC PANEL WITH GFR
AG Ratio: 1.9 (calc) (ref 1.0–2.5)
ALT: 26 U/L (ref 6–29)
AST: 40 U/L — ABNORMAL HIGH (ref 10–35)
Albumin: 3.8 g/dL (ref 3.6–5.1)
Alkaline phosphatase (APISO): 88 U/L (ref 37–153)
BUN/Creatinine Ratio: 19 (calc) (ref 6–22)
BUN: 60 mg/dL — ABNORMAL HIGH (ref 7–25)
CO2: 23 mmol/L (ref 20–32)
Calcium: 8.4 mg/dL — ABNORMAL LOW (ref 8.6–10.4)
Chloride: 96 mmol/L — ABNORMAL LOW (ref 98–110)
Creat: 3.16 mg/dL — ABNORMAL HIGH (ref 0.60–1.00)
Globulin: 2 g/dL (calc) (ref 1.9–3.7)
Glucose, Bld: 124 mg/dL (ref 65–139)
Potassium: 3.5 mmol/L (ref 3.5–5.3)
Sodium: 132 mmol/L — ABNORMAL LOW (ref 135–146)
Total Bilirubin: 0.5 mg/dL (ref 0.2–1.2)
Total Protein: 5.8 g/dL — ABNORMAL LOW (ref 6.1–8.1)
eGFR: 15 mL/min/{1.73_m2} — ABNORMAL LOW (ref >=60)

## 2021-06-09 NOTE — Progress Notes (Signed)
MyChart Sent: Your labs point to significant dehydration. I spoke with Dr. Darene Lamer, and he is planning to give you some IV fluids at your appointment with him tomorrow.

## 2021-06-10 ENCOUNTER — Other Ambulatory Visit: Payer: Self-pay

## 2021-06-10 ENCOUNTER — Encounter: Payer: Self-pay | Admitting: Sports Medicine

## 2021-06-10 ENCOUNTER — Other Ambulatory Visit: Payer: Self-pay | Admitting: Sports Medicine

## 2021-06-10 ENCOUNTER — Ambulatory Visit: Payer: Medicare Other | Admitting: Sports Medicine

## 2021-06-10 ENCOUNTER — Ambulatory Visit (INDEPENDENT_AMBULATORY_CARE_PROVIDER_SITE_OTHER): Payer: Medicare Other

## 2021-06-10 ENCOUNTER — Ambulatory Visit (INDEPENDENT_AMBULATORY_CARE_PROVIDER_SITE_OTHER): Payer: Medicare Other | Admitting: Sports Medicine

## 2021-06-10 DIAGNOSIS — N19 Unspecified kidney failure: Secondary | ICD-10-CM | POA: Diagnosis not present

## 2021-06-10 DIAGNOSIS — M79672 Pain in left foot: Secondary | ICD-10-CM

## 2021-06-10 DIAGNOSIS — M48061 Spinal stenosis, lumbar region without neurogenic claudication: Secondary | ICD-10-CM | POA: Diagnosis not present

## 2021-06-10 DIAGNOSIS — M25579 Pain in unspecified ankle and joints of unspecified foot: Secondary | ICD-10-CM

## 2021-06-10 DIAGNOSIS — S99922A Unspecified injury of left foot, initial encounter: Secondary | ICD-10-CM

## 2021-06-10 DIAGNOSIS — M25572 Pain in left ankle and joints of left foot: Secondary | ICD-10-CM

## 2021-06-10 LAB — COMPREHENSIVE METABOLIC PANEL
AG Ratio: 1.9 (calc) (ref 1.0–2.5)
ALT: 28 U/L (ref 6–29)
AST: 38 U/L — ABNORMAL HIGH (ref 10–35)
Albumin: 3.6 g/dL (ref 3.6–5.1)
Alkaline phosphatase (APISO): 83 U/L (ref 37–153)
BUN/Creatinine Ratio: 40 (calc) — ABNORMAL HIGH (ref 6–22)
BUN: 44 mg/dL — ABNORMAL HIGH (ref 7–25)
CO2: 27 mmol/L (ref 20–32)
Calcium: 9 mg/dL (ref 8.6–10.4)
Chloride: 101 mmol/L (ref 98–110)
Creat: 1.1 mg/dL — ABNORMAL HIGH (ref 0.60–1.00)
Globulin: 1.9 g/dL (calc) (ref 1.9–3.7)
Glucose, Bld: 118 mg/dL — ABNORMAL HIGH (ref 65–99)
Potassium: 4.2 mmol/L (ref 3.5–5.3)
Sodium: 139 mmol/L (ref 135–146)
Total Bilirubin: 0.4 mg/dL (ref 0.2–1.2)
Total Protein: 5.5 g/dL — ABNORMAL LOW (ref 6.1–8.1)

## 2021-06-10 LAB — CBC
HCT: 28.9 % — ABNORMAL LOW (ref 35.0–45.0)
Hemoglobin: 10 g/dL — ABNORMAL LOW (ref 11.7–15.5)
MCH: 33.2 pg — ABNORMAL HIGH (ref 27.0–33.0)
MCHC: 34.6 g/dL (ref 32.0–36.0)
MCV: 96 fL (ref 80.0–100.0)
MPV: 10.1 fL (ref 7.5–12.5)
Platelets: 243 10*3/uL (ref 140–400)
RBC: 3.01 10*6/uL — ABNORMAL LOW (ref 3.80–5.10)
RDW: 14.5 % (ref 11.0–15.0)
WBC: 5 10*3/uL (ref 3.8–10.8)

## 2021-06-10 MED ORDER — TRAMADOL HCL 50 MG PO TABS
ORAL_TABLET | ORAL | 0 refills | Status: DC
Start: 2021-06-10 — End: 2021-12-31

## 2021-06-10 NOTE — Assessment & Plan Note (Addendum)
[name] also injured her left foot, she has tenderness at the neck of the fourth metatarsal with bruising, adding some x-rays, postop shoe. Refilling tramadol.

## 2021-06-10 NOTE — Progress Notes (Signed)
    Procedures performed today:    20-gauge angiocatheter placed in the left dorsal venous plexus of the hand after failure into the right cubital vein, I infused 2 L of lactated Ringer's.  Independent interpretation of notes and tests performed by another provider:   None.  Brief History, Exam, Impression, and Recommendations:    Acute prerenal azotemia A pleasant 70 year old female, she is on furosemide and valsartan/HCTZ for hypertension, recently her husband passed, her intake has not been very good, she did develop weakness, unsteadiness, low blood pressure. She was seen by Caleen Jobs, DNP, diagnosed with prerenal azotemia, significantly elevated BUN and creatinine with a ratio greater than 20 Today we ran 2 L normal saline and I will be holding her valsartan/HCTZ and her furosemide for 2 weeks. I would like to recheck her renal function today after the IV fluids.  Injury of foot, left Teyona also injured her left foot, she has tenderness at the neck of the fourth metatarsal with bruising, adding some x-rays, postop shoe. Refilling tramadol.  I spent 40 minutes of total time managing this patient today, this includes chart review, face to face, and non-face to face time.  Specifically we discussed renal failure, grieving.  We ran the IV fluids.   ___________________________________________ [name]. [name], M.D., ABFM., CAQSM. Primary Care and [place] Instructor of [facility]

## 2021-06-10 NOTE — Assessment & Plan Note (Signed)
A pleasant 70 year old female, she is on furosemide and valsartan/HCTZ for hypertension, recently her husband passed, her intake has not been very good, she did develop weakness, unsteadiness, low blood pressure. She was seen by Caleen Jobs, DNP, diagnosed with prerenal azotemia, significantly elevated BUN and creatinine with a ratio greater than 20 Today we ran 2 L normal saline and I will be holding her valsartan/HCTZ and her furosemide for 2 weeks. I would like to recheck her renal function today after the IV fluids.

## 2021-06-15 NOTE — Progress Notes (Signed)
Left voicemail for patient to call back to schedule this week or early next week with T or one of his partners. AM

## 2021-06-21 ENCOUNTER — Encounter: Payer: Self-pay | Admitting: Family Medicine

## 2021-06-21 ENCOUNTER — Other Ambulatory Visit: Payer: Self-pay

## 2021-06-21 ENCOUNTER — Ambulatory Visit (INDEPENDENT_AMBULATORY_CARE_PROVIDER_SITE_OTHER): Payer: Medicare Other | Admitting: Family Medicine

## 2021-06-21 VITALS — BP 144/83 | HR 89 | Temp 98.0°F | Resp 18 | Wt 233.0 lb

## 2021-06-21 DIAGNOSIS — S99922D Unspecified injury of left foot, subsequent encounter: Secondary | ICD-10-CM

## 2021-06-21 DIAGNOSIS — R600 Localized edema: Secondary | ICD-10-CM | POA: Diagnosis not present

## 2021-06-21 DIAGNOSIS — N19 Unspecified kidney failure: Secondary | ICD-10-CM | POA: Diagnosis not present

## 2021-06-21 NOTE — Progress Notes (Signed)
Acute Office Visit  Subjective:    Patient ID: [name], female    DOB: Aug 14, 1951, 70 y.o.   MRN: [id number]  Chief Complaint  Patient presents with   Edema    HPI Patient is in today for f/u on kidney function and lower extremity edema.  Her husband recently passed and she was not eating/drinking and severely dehydrated with impaired renal function. She received fluids in office on 06/10/21 with PCP and labs afterward were significantly improved. She was instructed to hold her Lasix and Diovan at that time given renal function and hypotension. Reports she did end up taking the meds one day last week because of swelling, but has not taken any since.   Today she reports she is having some bilateral lower extremity edema that feels tight and makes it difficult to walk at times, however she is walking independently, no walker/wheelchair at this visit. She reports she's not really sure why she is here because she was supposed to be following up with Dr. Darene Lamer on Friday. After speaking with his CMA, patient had called last week reporting how swollen she was and was encouraged to schedule a sooner appointment. She denies any weeping or skin changes. Reports she is still sad, but not ready to make changes to her Effexor yet - will let us know. She is grieving appropriately. Also reports last night she did feel somewhat weak and lightheaded and is not sure if she may be dehydrated again. Reports she is forcing herself to eat/drink throughout the day. Currently living in finished basement apartment at daughter's house, but does stay home alone during the day while they are at work.   At last visit with PCP she noted that her foot was bruised and sore after a fall. Xrays are sore. Reports bruising is mostly resolved and she is not in any pain or have any change to ROM, but she does feel numb in her foot. Denies any skin changes.   Patient denies any chest pain, shortness of breath, palpitations, GI/GU  changes, skin changes, vision changes.    Past Medical History:  Diagnosis Date   Anxiety    Atrial fibrillation (HCC)    Depression    GERD (gastroesophageal reflux disease)    Hypertension    Plaque psoriasis    Thyroid disease     Past Surgical History:  Procedure Laterality Date   ORIF ANKLE FRACTURE      Family History  Problem Relation Age of Onset   Hypertension Mother    Hypertension Father    Cancer Father        colon   Cancer Daughter        daughter   Cancer Son        skin   Cancer Maternal Aunt        breast   Hypertension Maternal Grandmother    Hypertension Maternal Grandfather    Heart attack Maternal Grandfather    Hypertension Paternal Grandmother    Stroke Paternal Grandfather    Cancer Maternal Aunt        breast    Social History   Socioeconomic History   Marital status: Married    Spouse name: Not on file   Number of children: Not on file   Years of education: Not on file   Highest education level: Not on file  Occupational History   Not on file  Tobacco Use   Smoking status: Never   Smokeless tobacco: Never  Substance and Sexual Activity   Alcohol use: Never   Drug use: Never   Sexual activity: Not on file  Other Topics Concern   Not on file  Social History Narrative   Not on file   Social Determinants of Health   Financial Resource Strain: Not on file  Food Insecurity: Not on file  Transportation Needs: Not on file  Physical Activity: Not on file  Stress: Not on file  Social Connections: Not on file  Intimate Partner Violence: Not on file    Outpatient Medications Prior to Visit  Medication Sig Dispense Refill   acetaminophen (TYLENOL) 650 MG CR tablet Take 1 tablet (650 mg total) by mouth every 8 (eight) hours as needed for pain. 90 tablet 3   AMBULATORY NON FORMULARY MEDICATION Medication Name: Rollator 1 each 0   atorvastatin (LIPITOR) 40 MG tablet TAKE 1 TABLET BY MOUTH EVERY EVENING AT 6 90 tablet 3   ELIQUIS  5 MG TABS tablet Take 1 tablet (5 mg total) by mouth 2 (two) times daily. 180 tablet 3   flecainide (TAMBOCOR) 50 MG tablet Take 50 mg by mouth 2 (two) times a day.     furosemide (LASIX) 40 MG tablet Take 1 tablet (40 mg total) by mouth daily. 30 tablet 3   gabapentin (NEURONTIN) 600 MG tablet Take 1 tablet (600 mg total) by mouth 2 (two) times daily. 180 tablet 3   icosapent Ethyl (VASCEPA) 1 g capsule Take 2 capsules (2 g total) by mouth 2 (two) times daily. 360 capsule 3   levothyroxine (SYNTHROID) 75 MCG tablet Take 1 tablet (75 mcg total) by mouth daily before breakfast. 90 tablet 1   metoprolol succinate (TOPROL-XL) 50 MG 24 hr tablet Take 1 tablet (50 mg total) by mouth daily. 90 tablet 3   pantoprazole (PROTONIX) 40 MG tablet Take 1 tablet (40 mg total) by mouth daily. 90 tablet 3   traMADol (ULTRAM) 50 MG tablet TAKE ONE TABLET BY MOUTH EVERY 8 HOURS AS NEEDED FOR MODERATE PAIN 30 tablet 0   traZODone (DESYREL) 100 MG tablet Take 1 tablet (100 mg total) by mouth at bedtime. 90 tablet 3   valsartan-hydrochlorothiazide (DIOVAN-HCT) 320-25 MG tablet Take 1 tablet by mouth daily. 90 tablet 3   venlafaxine XR (EFFEXOR-XR) 150 MG 24 hr capsule Take 1 capsule (150 mg total) by mouth daily. 90 capsule 3   No facility-administered medications prior to visit.    No Known Allergies  Review of Systems All review of systems negative except what is listed in the HPI     Objective:    Physical Exam Vitals reviewed.  Constitutional:      Appearance: Normal appearance.  HENT:     Head: Normocephalic and atraumatic.  Cardiovascular:     Rate and Rhythm: Normal rate and regular rhythm.     Pulses: Normal pulses.     Heart sounds: Normal heart sounds.  Pulmonary:     Effort: Pulmonary effort is normal.     Breath sounds: Normal breath sounds.  Musculoskeletal:        General: No tenderness or signs of injury.     Right lower leg: Edema present.     Left lower leg: Edema present.      Comments: BLE edema +1  Skin:    General: Skin is warm and dry.     Capillary Refill: Capillary refill takes less than 2 seconds.     Findings: No bruising, erythema, lesion or rash.  Neurological:     Mental Status: She is alert and oriented to person, place, and time. Mental status is at baseline.     Comments: Numbness and impaired sensation to L foot, normal pulses and cap refill, full ROM  Psychiatric:        Mood and Affect: Mood normal.        Behavior: Behavior normal.        Thought Content: Thought content normal.        Judgment: Judgment normal.    BP (!) 144/83   Pulse 89   Temp 98 F (36.7 C)   Resp 18   Wt 233 lb (105.7 kg)   SpO2 95%   BMI 42.62 kg/m  Wt Readings from Last 3 Encounters:  06/21/21 233 lb (105.7 kg)  06/08/21 233 lb (105.7 kg)  03/09/21 236 lb (107 kg)    Health Maintenance Due  Topic Date Due   Zoster Vaccines- Shingrix (1 of 2) Never done   DEXA SCAN  Never done   COVID-19 Vaccine (3 - Pfizer risk series) 02/24/2020   MAMMOGRAM  04/18/2021    There are no preventive care reminders to display for this patient.   Lab Results  Component Value Date   TSH 2.33 09/15/2020   Lab Results  Component Value Date   WBC 5.0 06/10/2021   HGB 10.0 (L) 06/10/2021   HCT 28.9 (L) 06/10/2021   MCV 96.0 06/10/2021   PLT 243 06/10/2021   Lab Results  Component Value Date   NA 139 06/10/2021   K 4.2 06/10/2021   CO2 27 06/10/2021   GLUCOSE 118 (H) 06/10/2021   BUN 44 (H) 06/10/2021   CREATININE 1.10 (H) 06/10/2021   BILITOT 0.4 06/10/2021   ALKPHOS 81 04/23/2007   AST 38 (H) 06/10/2021   ALT 28 06/10/2021   PROT 5.5 (L) 06/10/2021   ALBUMIN 3.2 (L) 04/23/2007   CALCIUM 9.0 06/10/2021   EGFR 15 (L) 06/08/2021   Lab Results  Component Value Date   CHOL 126 09/15/2020   Lab Results  Component Value Date   HDL 52 09/15/2020   Lab Results  Component Value Date   LDLCALC 46 09/15/2020   Lab Results  Component Value Date   TRIG  221 (H) 09/15/2020   Lab Results  Component Value Date   CHOLHDL 2.4 09/15/2020   Lab Results  Component Value Date   HGBA1C 5.5 03/11/2020       Assessment & Plan:   1. Acute prerenal azotemia Rechecking kidney function today. BP improved, but continue holding Lasix and Diovan for now - waiting for kidney function to stabilize and do not want to risk making her hypotensive or orthostatic and increasing risk for falls. Patient/daughter agreeable to plan.  - COMPLETE METABOLIC PANEL WITH GFR  2. Injury of left foot, subsequent encounter 3. Lower extremity edema Mostly improved apart from numbness and impaired sensation -possibly related to the edema. Normal exam otherwise. No other numbness/tingling/pain down the leg. BLE +1 pitting edema. Recommend she wear compression socks, elevated feet as much as possible. Continue holding Lasix and Diovan for now. Rechecking kidney function today. BP stable for her age.   Discussed case with PCP, Dr. [name].   Patient aware of signs/symptoms requiring further/urgent evaluation.  Follow-up with PCP in 2 months or sooner if needed.     [place] Olevia Bowens, DNP, FNP-C

## 2021-06-21 NOTE — Patient Instructions (Signed)
Labs today Continue not taking your Lasix and Diovan until you meet with Dr. Darene Lamer again in about 2 months Wear compression socks Keep legs elevated as much as able

## 2021-06-22 LAB — COMPLETE METABOLIC PANEL WITH GFR
AG Ratio: 2.2 (calc) (ref 1.0–2.5)
ALT: 37 U/L — ABNORMAL HIGH (ref 6–29)
AST: 42 U/L — ABNORMAL HIGH (ref 10–35)
Albumin: 4 g/dL (ref 3.6–5.1)
Alkaline phosphatase (APISO): 83 U/L (ref 37–153)
BUN: 20 mg/dL (ref 7–25)
CO2: 27 mmol/L (ref 20–32)
Calcium: 9.4 mg/dL (ref 8.6–10.4)
Chloride: 110 mmol/L (ref 98–110)
Creat: 0.82 mg/dL (ref 0.60–1.00)
Globulin: 1.8 g/dL (calc) — ABNORMAL LOW (ref 1.9–3.7)
Glucose, Bld: 111 mg/dL — ABNORMAL HIGH (ref 65–99)
Potassium: 3.9 mmol/L (ref 3.5–5.3)
Sodium: 145 mmol/L (ref 135–146)
Total Bilirubin: 0.6 mg/dL (ref 0.2–1.2)
Total Protein: 5.8 g/dL — ABNORMAL LOW (ref 6.1–8.1)
eGFR: 77 mL/min/{1.73_m2} (ref >=60)

## 2021-06-22 NOTE — Progress Notes (Signed)
MyChart message sent: Kidney function is back to normal. Liver enzymes are very mildly elevated. This is something we will recheck at next appointment.

## 2021-06-25 ENCOUNTER — Ambulatory Visit: Payer: Medicare Other | Admitting: Sports Medicine

## 2021-07-01 ENCOUNTER — Other Ambulatory Visit: Payer: Self-pay

## 2021-07-01 ENCOUNTER — Emergency Department
Admission: EM | Admit: 2021-07-01 | Discharge: 2021-07-01 | Disposition: A | Discharge status: Home / Self Care | Payer: Medicare Other | Source: Home / Self Care

## 2021-07-01 ENCOUNTER — Encounter: Payer: Self-pay | Admitting: Emergency Medicine

## 2021-07-01 DIAGNOSIS — R0609 Other forms of dyspnea: Secondary | ICD-10-CM

## 2021-07-01 DIAGNOSIS — R0600 Dyspnea, unspecified: Secondary | ICD-10-CM | POA: Diagnosis not present

## 2021-07-01 MED ORDER — METHYLPREDNISOLONE ACETATE 80 MG/ML IJ SUSP
80.0000 mg | Freq: Once | INTRAMUSCULAR | Status: AC
  Administered 2021-07-01: 80 mg via INTRAMUSCULAR

## 2021-07-01 NOTE — ED Provider Notes (Signed)
[name]    CSN: [id number] Arrival date & time: 07/01/21  1107      History   Chief Complaint Chief Complaint  Patient presents with   Shortness of Breath    HPI [name] is a 70 y.o. female.   HPI 70 year old female presents with shortness of breath x5 days.  Denies cough and/or congestion, reports PCP just stopped furosemide and valsartan/HCTZ.  PMH significant for chronic A. fib and renal failure.  Patient is accompanied by her daughter this afternoon and reports more of an issue with dyspnea with exertion.  Patient is having shortness of breath when she exerts herself and not at rest.  Past Medical History:  Diagnosis Date   Anxiety    Atrial fibrillation (HCC)    Depression    GERD (gastroesophageal reflux disease)    Hypertension    Plaque psoriasis    Thyroid disease     Patient Active Problem List   Diagnosis Date Noted   Acute prerenal azotemia 06/10/2021   Injury of foot, left 06/10/2021   Polypharmacy 10/06/2020   Primary osteoarthritis of both knees 05/29/2020   Positive colorectal cancer screening using Cologuard test 06/05/2019   Lumbar spinal stenosis 03/27/2019   Localized swelling of lower extremity 03/27/2019   Mixed hyperlipidemia 08/19/2018   Annual physical exam 08/17/2018   Chronic atrial fibrillation ([place]) 08/17/2018   Benign essential hypertension 08/17/2018   Depression, major 08/17/2018   Hypothyroidism 08/17/2018   GERD (gastroesophageal reflux disease) 08/17/2018   Coughing 12/29/2008    Past Surgical History:  Procedure Laterality Date   ORIF ANKLE FRACTURE      OB History   No obstetric history on file.      Home Medications    Prior to Admission medications   Medication Sig Start Date End Date Taking? Authorizing Provider  acetaminophen (TYLENOL) 650 MG CR tablet Take 1 tablet (650 mg total) by mouth every 8 (eight) hours as needed for pain. 04/24/19   Silverio Decamp, MD  AMBULATORY NON  FORMULARY MEDICATION Medication Name: Rollator 06/08/21   Silverio Decamp, MD  atorvastatin (LIPITOR) 40 MG tablet TAKE 1 TABLET BY MOUTH EVERY EVENING AT 6 03/09/21   Silverio Decamp, MD  ELIQUIS 5 MG TABS tablet Take 1 tablet (5 mg total) by mouth 2 (two) times daily. 03/09/21   Silverio Decamp, MD  flecainide (TAMBOCOR) 50 MG tablet Take 50 mg by mouth 2 (two) times a day. 03/20/19   [provider]  furosemide (LASIX) 40 MG tablet Take 1 tablet (40 mg total) by mouth daily. 03/09/21   Silverio Decamp, MD  gabapentin (NEURONTIN) 600 MG tablet Take 1 tablet (600 mg total) by mouth 2 (two) times daily. 05/31/21   Silverio Decamp, MD  icosapent Ethyl (VASCEPA) 1 g capsule Take 2 capsules (2 g total) by mouth 2 (two) times daily. 03/09/21   Silverio Decamp, MD  levothyroxine (SYNTHROID) 75 MCG tablet Take 1 tablet (75 mcg total) by mouth daily before breakfast. 03/09/21   Silverio Decamp, MD  metoprolol succinate (TOPROL-XL) 50 MG 24 hr tablet Take 1 tablet (50 mg total) by mouth daily. 03/09/21   Silverio Decamp, MD  pantoprazole (PROTONIX) 40 MG tablet Take 1 tablet (40 mg total) by mouth daily. 03/09/21   Silverio Decamp, MD  traMADol (ULTRAM) 50 MG tablet TAKE ONE TABLET BY MOUTH EVERY 8 HOURS AS NEEDED FOR MODERATE PAIN 06/10/21   Aundria Mems  J, MD  traZODone (DESYREL) 100 MG tablet Take 1 tablet (100 mg total) by mouth at bedtime. 03/09/21   Silverio Decamp, MD  venlafaxine XR (EFFEXOR-XR) 150 MG 24 hr capsule Take 1 capsule (150 mg total) by mouth daily. 03/09/21   Silverio Decamp, MD    Family History Family History  Problem Relation Age of Onset   Hypertension Mother    Hypertension Father    Cancer Father        colon   Cancer Daughter        daughter   Cancer Son        skin   Cancer Maternal Aunt        breast   Hypertension Maternal Grandmother    Hypertension Maternal Grandfather    Heart attack  Maternal Grandfather    Hypertension Paternal Grandmother    Stroke Paternal Grandfather    Cancer Maternal Aunt        breast    Social History Social History   Tobacco Use   Smoking status: Never   Smokeless tobacco: Never  Vaping Use   Vaping Use: Never used  Substance Use Topics   Alcohol use: Never   Drug use: Never     Allergies   Patient has no known allergies.   Review of Systems Review of Systems  Respiratory:         Dyspnea with exertion x 5 days  All other systems reviewed and are negative.   Physical Exam Triage Vital Signs ED Triage Vitals  Enc Vitals Group     BP 07/01/21 1122 (!) 171/96     Pulse Rate 07/01/21 1122 71     Resp 07/01/21 1122 20     Temp 07/01/21 1122 98.1 F (36.7 C)     Temp Source 07/01/21 1122 Oral     SpO2 07/01/21 1122 96 %     Weight 07/01/21 1123 220 lb (99.8 kg)     Height 07/01/21 1123 '5\' 2"'$  (1.575 m)     Head Circumference --      Peak Flow --      Pain Score 07/01/21 1122 0     Pain Loc --      Pain Edu? --      Excl. in [place]? --    No data found.  Updated Vital Signs BP (!) 171/96 (BP Location: Right Arm)   Pulse 71   Temp 98.1 F (36.7 C) (Oral)   Resp 20   Ht '5\' 2"'$  (1.575 m)   Wt 220 lb (99.8 kg)   SpO2 96%   BMI 40.24 kg/m     Physical Exam Vitals and nursing note reviewed.  Constitutional:      General: She is not in acute distress.    Appearance: Normal appearance. She is obese. She is not ill-appearing, toxic-appearing or diaphoretic.  HENT:     Head: Normocephalic and atraumatic.     Right Ear: Tympanic membrane, ear canal and external ear normal.     Left Ear: Tympanic membrane, ear canal and external ear normal.     Mouth/Throat:     Mouth: Mucous membranes are moist.     Pharynx: Oropharynx is clear.  Eyes:     Extraocular Movements: Extraocular movements intact.     Conjunctiva/sclera: Conjunctivae normal.     Pupils: Pupils are equal, round, and reactive to light.  Cardiovascular:      Rate and Rhythm: Normal rate and regular rhythm.  Pulses: Normal pulses.     Heart sounds: Normal heart sounds. No murmur heard.   No friction rub. No gallop.     Comments: 2+ pitting/nonpitting edema noted bilaterally, PT pulses present Pulmonary:     Effort: Pulmonary effort is normal. No respiratory distress.     Breath sounds: Normal breath sounds. No stridor. No wheezing, rhonchi or rales.  Chest:     Chest wall: No tenderness.  Musculoskeletal:        General: Normal range of motion.  Skin:    General: Skin is warm and dry.  Neurological:     General: No focal deficit present.     Mental Status: She is alert and oriented to person, place, and time. Mental status is at baseline.  Psychiatric:        Mood and Affect: Mood normal.        Behavior: Behavior normal.        Thought Content: Thought content normal.     UC Treatments / Results  Labs (all labs ordered are listed, but only abnormal results are displayed) Labs Reviewed - No data to display  EKG   Radiology No results found.  Procedures Procedures (including critical care time)  Medications Ordered in UC Medications  methylPREDNISolone acetate (DEPO-MEDROL) injection 80 mg (has no administration in time range)    Initial Impression / Assessment and Plan / UC Course  I have reviewed the triage vital signs and the nursing notes.  Pertinent labs & imaging results that were available during my care of the patient were reviewed by me and considered in my medical decision making (see chart for details).     MDM: 1.  Dyspnea on exertion-Depo-Medrol 80 given once in clinic prior to discharge today. Advised/encouraged patient/daughter to follow-up with PCP regarding dyspnea with exertion.  Advised patient/daughter current conditioning level, body habitus, and medical comorbidities combined are causing her current symptoms.  Advised patient of possibility of medically monitored physical conditioning programs  to help improve overall functional capacity.  Charged home, hemodynamically stable. Final Clinical Impressions(s) / UC Diagnoses   Final diagnoses:  Dyspnea on exertion     Discharge Instructions      Advised/encouraged patient/daughter to follow-up with PCP regarding dyspnea with exertion.  Advised patient/daughter current conditioning level, body habitus, and medical comorbidities combined are causing her current symptoms.  Advised patient of possibility of medically monitored physical conditioning programs to help improve overall functional capacity.     ED Prescriptions   None    PDMP not reviewed this encounter.   Eliezer Lofts, [place] 07/01/21 1231

## 2021-07-01 NOTE — ED Triage Notes (Addendum)
SOB x 5 days, denies cough, congestion Vaccinated. Just stopped taking Furosemide and valsartan-HCTZ.Patient is in renal failure Husband died unexpectedly about a month ago

## 2021-07-01 NOTE — Discharge Instructions (Addendum)
Advised/encouraged patient/daughter to follow-up with PCP regarding dyspnea with exertion.  Advised patient/daughter current conditioning level, body habitus, and medical comorbidities combined are causing her current symptoms.  Advised patient of possibility of medically monitored physical conditioning programs to help improve overall functional capacity.

## 2021-07-04 ENCOUNTER — Other Ambulatory Visit: Payer: Self-pay | Admitting: Sports Medicine

## 2021-07-04 DIAGNOSIS — I10 Essential (primary) hypertension: Secondary | ICD-10-CM

## 2021-07-07 ENCOUNTER — Ambulatory Visit (INDEPENDENT_AMBULATORY_CARE_PROVIDER_SITE_OTHER): Payer: Medicare Other

## 2021-07-07 ENCOUNTER — Other Ambulatory Visit: Payer: Self-pay

## 2021-07-07 DIAGNOSIS — Z0000 Encounter for general adult medical examination without abnormal findings: Secondary | ICD-10-CM | POA: Diagnosis not present

## 2021-07-07 NOTE — Progress Notes (Signed)
Virtual Visit via Telephone Note  I connected with  [name] on 07/07/21 at  6:20 PM EDT by telephone and verified that I am speaking with the correct person using two identifiers.  Medicare Annual Wellness visit completed telephonically due to Covid-19 pandemic.   Persons participating in this call: This Health Coach and this patient.   Location: Patient: home Provider: office   I discussed the limitations, risks, security and privacy concerns of performing an evaluation and management service by telephone and the availability of in person appointments. The patient expressed understanding and agreed to proceed.  Unable to perform video visit due to video visit attempted and failed and/or patient does not have video capability.   Some vital signs may be absent or patient reported.   Willette Brace, LPN   Subjective:   [name] is a 70 y.o. female who presents for an Initial Medicare Annual Wellness Visit.  Review of Systems     Cardiac Risk Factors include: advanced age (>51mn, >>3women);hypertension;obesity (BMI >30kg/m2)     Objective:    There were no vitals filed for this visit. There is no height or weight on file to calculate BMI.  Advanced Directives 07/07/2021 06/10/2021  Does Patient Have a Medical Advance Directive? Yes No  Type of Advance Directive [place] Chart? Yes - validated most recent copy scanned in chart (See row information) -  Would patient like information on creating a medical advance directive? - Yes (MAU/Ambulatory/Procedural Areas - Information given)    Current Medications (verified) Outpatient Encounter Medications as of 07/07/2021  Medication Sig   acetaminophen (TYLENOL) 650 MG CR tablet Take 1 tablet (650 mg total) by mouth every 8 (eight) hours as needed for pain.   atorvastatin (LIPITOR) 40 MG tablet TAKE 1 TABLET BY MOUTH EVERY EVENING AT 6   ELIQUIS 5 MG TABS tablet  Take 1 tablet (5 mg total) by mouth 2 (two) times daily.   flecainide (TAMBOCOR) 50 MG tablet Take 50 mg by mouth 2 (two) times a day.   gabapentin (NEURONTIN) 600 MG tablet Take 1 tablet (600 mg total) by mouth 2 (two) times daily.   icosapent Ethyl (VASCEPA) 1 g capsule Take 2 capsules (2 g total) by mouth 2 (two) times daily.   levothyroxine (SYNTHROID) 75 MCG tablet Take 1 tablet (75 mcg total) by mouth daily before breakfast.   metoprolol succinate (TOPROL-XL) 50 MG 24 hr tablet Take 1 tablet (50 mg total) by mouth daily.   pantoprazole (PROTONIX) 40 MG tablet Take 1 tablet (40 mg total) by mouth daily.   traMADol (ULTRAM) 50 MG tablet TAKE ONE TABLET BY MOUTH EVERY 8 HOURS AS NEEDED FOR MODERATE PAIN   traZODone (DESYREL) 100 MG tablet Take 1 tablet (100 mg total) by mouth at bedtime.   venlafaxine XR (EFFEXOR-XR) 150 MG 24 hr capsule Take 1 capsule (150 mg total) by mouth daily.   AMBULATORY NON FORMULARY MEDICATION Medication Name: Rollator (Patient not taking: Reported on 07/07/2021)   [DISCONTINUED] furosemide (LASIX) 40 MG tablet Take 1 tablet (40 mg total) by mouth daily. (Patient not taking: Reported on 07/07/2021)   No facility-administered encounter medications on file as of 07/07/2021.    Allergies (verified) Patient has no known allergies.   History: Past Medical History:  Diagnosis Date   Anxiety    Atrial fibrillation (HCC)    Depression    GERD (gastroesophageal reflux disease)  Hypertension    Plaque psoriasis    Thyroid disease    Past Surgical History:  Procedure Laterality Date   ORIF ANKLE FRACTURE     Family History  Problem Relation Age of Onset   Hypertension Mother    Hypertension Father    Cancer Father        colon   Cancer Daughter        daughter   Cancer Son        skin   Cancer Maternal Aunt        breast   Hypertension Maternal Grandmother    Hypertension Maternal Grandfather    Heart attack Maternal Grandfather    Hypertension Paternal  Grandmother    Stroke Paternal Grandfather    Cancer Maternal Aunt        breast   Social History   Socioeconomic History   Marital status: Widowed    Spouse name: Not on file   Number of children: Not on file   Years of education: Not on file   Highest education level: Not on file  Occupational History   Not on file  Tobacco Use   Smoking status: Never   Smokeless tobacco: Never  Vaping Use   Vaping Use: Never used  Substance and Sexual Activity   Alcohol use: Never   Drug use: Never   Sexual activity: Not on file  Other Topics Concern   Not on file  Social History Narrative   Not on file   Social Determinants of Health   Financial Resource Strain: Low Risk    Difficulty of Paying Living Expenses: Not hard at all  Food Insecurity: No Food Insecurity   Worried About Running Out of Food in the Last Year: Never true   [place] in the Last Year: Never true  Transportation Needs: No Transportation Needs   Lack of Transportation (Medical): No   Lack of Transportation (Non-Medical): No  Physical Activity: Inactive   Days of Exercise per Week: 0 days   Minutes of Exercise per Session: 0 min  Stress: No Stress Concern Present   Feeling of Stress : Only a little  Social Connections: Moderately Isolated   Frequency of Communication with Friends and Family: More than three times a week   Frequency of Social Gatherings with Friends and Family: Once a week   Attends Religious Services: More than 4 times per year   Active Member of [employer] or Organizations: No   Attends Archivist Meetings: Never   Marital Status: Widowed    Tobacco Counseling Counseling given: Not Answered   Clinical Intake:  Pre-visit preparation completed: Yes  Pain : No/denies pain     BMI - recorded: 40.24 Nutritional Status: BMI > 30  Obese Nutritional Risks: None Diabetes: No  How often do you need to have someone help you when you read instructions, pamphlets, or other  written materials from your doctor or pharmacy?: 1 - Never  Diabetic?no  Interpreter Needed?: No  Information entered by :: Charlott Rakes, LPN   Activities of Daily Living In your present state of health, do you have any difficulty performing the following activities: 07/07/2021  Hearing? N  Vision? N  Difficulty concentrating or making decisions? N  Walking or climbing stairs? N  Dressing or bathing? N  Doing errands, shopping? N  Preparing Food and eating ? Y  Comment daughter assist  Using the Toilet? N  In the past six months, have you accidently leaked  urine? Y  Comment has a sleeve and wears a pad  Do you have problems with loss of bowel control? N  Managing your Medications? N  Managing your Finances? N  Housekeeping or managing your Housekeeping? N  Some recent data might be hidden    Patient Care Team: Silverio Decamp, MD as PCP - General (Sports Medicine) Valaria Good, MD as Referring Physician (Cardiology)  Indicate any recent Medical Services you may have received from other than [facility] providers in the past year (date may be approximate).     Assessment:   This is a routine wellness examination for [name].  Hearing/Vision screen Hearing Screening - Comments:: Pt denies any hearing issues Vision Screening - Comments:: Pt follows up with provider unsure of name  Dietary issues and exercise activities discussed: Current Exercise Habits: The patient does not participate in regular exercise at present   Goals Addressed             This Visit's Progress    Patient Stated       None at this time       Depression Screen PHQ 2/9 Scores 07/07/2021 06/08/2021 05/29/2020 08/17/2018  PHQ - 2 Score 1 - 4 0  PHQ- 9 Score - - 15 7  Exception Documentation - Other- indicate reason in comment box - -  Not completed - pt is grieving - -    Fall Risk Fall Risk  07/07/2021 06/08/2021  Falls in the past year? 1 1  Number falls in past yr: 1 0  Injury with Fall?  0 0  Risk for fall due to : Impaired vision;Impaired balance/gait;Impaired mobility Mental status change;Other (Comment)  Risk for fall due to: Comment - grief  Follow up Falls prevention discussed Falls prevention discussed    FALL RISK PREVENTION PERTAINING TO THE HOME:  Any stairs in or around the home? Yes  If so, are there any without handrails? No  Home free of loose throw rugs in walkways, pet beds, electrical cords, etc? Yes  Adequate lighting in your home to reduce risk of falls? Yes   ASSISTIVE DEVICES UTILIZED TO PREVENT FALLS:  Life alert? No  Use of a cane, walker or w/c? No  Grab bars in the bathroom? Yes  Shower chair or bench in shower? Yes  Elevated toilet seat or a handicapped toilet? No   TIMED UP AND GO:  Was the test performed? No .   Cognitive Function:declined at this time        Immunizations Immunization History  Administered Date(s) Administered   PFIZER(Purple Top)SARS-COV-2 Vaccination 01/06/2020, 01/27/2020    Not a candidate for Tetanus, Influenza or pneumococcal vaccines   Covid-19 vaccine status: Completed vaccines  Qualifies for Shingles Vaccine? Yes   Zostavax completed No   Shingrix Completed?: No.    Education has been provided regarding the importance of this vaccine. Patient has been advised to call insurance company to determine out of pocket expense if they have not yet received this vaccine. Advised may also receive vaccine at local pharmacy or Health Dept. Verbalized acceptance and understanding.  Screening Tests Health Maintenance  Topic Date Due   Zoster Vaccines- Shingrix (1 of 2) Never done   DEXA SCAN  Never done   COVID-19 Vaccine (3 - Pfizer risk series) 02/24/2020   MAMMOGRAM  04/18/2021   Fecal DNA (Cologuard)  05/29/2022   Hepatitis C Screening  Completed   HPV VACCINES  Aged Out    Health Maintenance  Health Maintenance  Due  Topic Date Due   Zoster Vaccines- Shingrix (1 of 2) Never done   DEXA SCAN   Never done   COVID-19 Vaccine (3 - Pfizer risk series) 02/24/2020   MAMMOGRAM  04/18/2021    Colorectal cancer screening: Type of screening: Cologuard. Completed 05/30/19. Repeat every 3 years  Mammogram status: Completed 04/19/19. Repeat every year      Additional Screening:  Hepatitis C Screening: Completed 04/15/19  Vision Screening: Recommended annual ophthalmology exams for early detection of glaucoma and other disorders of the eye. Is the patient up to date with their annual eye exam?  Yes  Who is the provider or what is the name of the office in which the patient attends annual eye exams? Unsure of name of provider If pt is not established with a provider, would they like to be referred to a provider to establish care? No .   Dental Screening: Recommended annual dental exams for proper oral hygiene  Community Resource Referral / Chronic Care Management: CRR required this visit?  No   CCM required this visit?  No      Plan:     I have personally reviewed and noted the following in the patient's chart:   Medical and social history Use of alcohol, tobacco or illicit drugs  Current medications and supplements including opioid prescriptions. Patient is currently taking opioid prescriptions. Information provided to patient regarding non-opioid alternatives. Patient advised to discuss non-opioid treatment plan with their provider. Functional ability and status Nutritional status Physical activity Advanced directives List of other physicians Hospitalizations, surgeries, and ER visits in previous 12 months Vitals Screenings to include cognitive, depression, and falls Referrals and appointments  In addition, I have reviewed and discussed with patient certain preventive protocols, quality metrics, and best practice recommendations. A written personalized care plan for preventive services as well as general preventive health recommendations were provided to patient.      Willette Brace, LPN   [id number]   Nurse Notes: pt request to revisit ordering mammograms and bone scans at this time

## 2021-07-07 NOTE — Patient Instructions (Addendum)
[name] , Thank you for taking time to come for your Medicare Wellness Visit. I appreciate your ongoing commitment to your health goals. Please review the following plan we discussed and let me know if I can assist you in the future.   Screening recommendations/referrals: Colonoscopy: Done cologuard 05/30/19 repeat every 3 years  Mammogram: Done 04/19/19 repeat every year Bone Density: pt aware needs to be ordered at later date Recommended yearly ophthalmology/optometry visit for glaucoma screening and checkup Recommended yearly dental visit for hygiene and checkup  Vaccinations:Not a candidate for Tetanus, Influenza or pneumococcal vaccines       Shingles vaccine: Shingrix discussed. Please contact your pharmacy for coverage information.    Covid-19:Completed 3/8, 01/27/20  Advanced directives: Copies in chart  Conditions/risks identified: None at this time  Next appointment: Follow up in one year for your annual wellness visit    Preventive Care 65 Years and Older, Female Preventive care refers to lifestyle choices and visits with your health care provider that can promote health and wellness. What does preventive care include? A yearly physical exam. This is also called an annual well check. Dental exams once or twice a year. Routine eye exams. Ask your health care provider how often you should have your eyes checked. Personal lifestyle choices, including: Daily care of your teeth and gums. Regular physical activity. Eating a healthy diet. Avoiding tobacco and drug use. Limiting alcohol use. Practicing safe sex. Taking low-dose aspirin every day. Taking vitamin and mineral supplements as recommended by your health care provider. What happens during an annual well check? The services and screenings done by your health care provider during your annual well check will depend on your age, overall health, lifestyle risk factors, and family history of disease. Counseling  Your  health care provider may ask you questions about your: Alcohol use. Tobacco use. Drug use. Emotional well-being. Home and relationship well-being. Sexual activity. Eating habits. History of falls. Memory and ability to understand (cognition). Work and work Statistician. Reproductive health. Screening  You may have the following tests or measurements: Height, weight, and BMI. Blood pressure. Lipid and cholesterol levels. These may be checked every 5 years, or more frequently if you are over 52 years old. Skin check. Lung cancer screening. You may have this screening every year starting at age 26 if you have a 30-pack-year history of smoking and currently smoke or have quit within the past 15 years. Fecal occult blood test (FOBT) of the stool. You may have this test every year starting at age 55. Flexible sigmoidoscopy or colonoscopy. You may have a sigmoidoscopy every 5 years or a colonoscopy every 10 years starting at age 35. Hepatitis C blood test. Hepatitis B blood test. Sexually transmitted disease (STD) testing. Diabetes screening. This is done by checking your blood sugar (glucose) after you have not eaten for a while (fasting). You may have this done every 1-3 years. Bone density scan. This is done to screen for osteoporosis. You may have this done starting at age 44. Mammogram. This may be done every 1-2 years. Talk to your health care provider about how often you should have regular mammograms. Talk with your health care provider about your test results, treatment options, and if necessary, the need for more tests. Vaccines  Your health care provider may recommend certain vaccines, such as: Influenza vaccine. This is recommended every year. Tetanus, diphtheria, and acellular pertussis (Tdap, Td) vaccine. You may need a Td booster every 10 years. Zoster vaccine. You may  need this after age 9. Pneumococcal 13-valent conjugate (PCV13) vaccine. One dose is recommended after age  20. Pneumococcal polysaccharide (PPSV23) vaccine. One dose is recommended after age 40. Talk to your health care provider about which screenings and vaccines you need and how often you need them. This information is not intended to replace advice given to you by your health care provider. Make sure you discuss any questions you have with your health care provider. Document Released: 11/13/2015 Document Revised: 07/06/2016 Document Reviewed: 08/18/2015 Elsevier Interactive Patient Education  2017 [place] Prevention in the Home Falls can cause injuries. They can happen to people of all ages. There are many things you can do to make your home safe and to help prevent falls. What can I do on the outside of my home? Regularly fix the edges of walkways and driveways and fix any cracks. Remove anything that might make you trip as you walk through a door, such as a raised step or threshold. Trim any bushes or trees on the path to your home. Use bright outdoor lighting. Clear any walking paths of anything that might make someone trip, such as rocks or tools. Regularly check to see if handrails are loose or broken. Make sure that both sides of any steps have handrails. Any raised decks and porches should have guardrails on the edges. Have any leaves, snow, or ice cleared regularly. Use sand or salt on walking paths during winter. Clean up any spills in your garage right away. This includes oil or grease spills. What can I do in the bathroom? Use night lights. Install grab bars by the toilet and in the tub and shower. Do not use towel bars as grab bars. Use non-skid mats or decals in the tub or shower. If you need to sit down in the shower, use a plastic, non-slip stool. Keep the floor dry. Clean up any water that spills on the floor as soon as it happens. Remove soap buildup in the tub or shower regularly. Attach bath mats securely with double-sided non-slip rug tape. Do not have throw  rugs and other things on the floor that can make you trip. What can I do in the bedroom? Use night lights. Make sure that you have a light by your bed that is easy to reach. Do not use any sheets or blankets that are too big for your bed. They should not hang down onto the floor. Have a firm chair that has side arms. You can use this for support while you get dressed. Do not have throw rugs and other things on the floor that can make you trip. What can I do in the kitchen? Clean up any spills right away. Avoid walking on wet floors. Keep items that you use a lot in easy-to-reach places. If you need to reach something above you, use a strong step stool that has a grab bar. Keep electrical cords out of the way. Do not use floor polish or wax that makes floors slippery. If you must use wax, use non-skid floor wax. Do not have throw rugs and other things on the floor that can make you trip. What can I do with my stairs? Do not leave any items on the stairs. Make sure that there are handrails on both sides of the stairs and use them. Fix handrails that are broken or loose. Make sure that handrails are as long as the stairways. Check any carpeting to make sure that it is firmly attached  to the stairs. Fix any carpet that is loose or worn. Avoid having throw rugs at the top or bottom of the stairs. If you do have throw rugs, attach them to the floor with carpet tape. Make sure that you have a light switch at the top of the stairs and the bottom of the stairs. If you do not have them, ask someone to add them for you. What else can I do to help prevent falls? Wear shoes that: Do not have high heels. Have rubber bottoms. Are comfortable and fit you well. Are closed at the toe. Do not wear sandals. If you use a stepladder: Make sure that it is fully opened. Do not climb a closed stepladder. Make sure that both sides of the stepladder are locked into place. Ask someone to hold it for you, if  possible. Clearly mark and make sure that you can see: Any grab bars or handrails. First and last steps. Where the edge of each step is. Use tools that help you move around (mobility aids) if they are needed. These include: Canes. Walkers. Scooters. Crutches. Turn on the lights when you go into a dark area. Replace any light bulbs as soon as they burn out. Set up your furniture so you have a clear path. Avoid moving your furniture around. If any of your floors are uneven, fix them. If there are any pets around you, be aware of where they are. Review your medicines with your doctor. Some medicines can make you feel dizzy. This can increase your chance of falling. Ask your doctor what other things that you can do to help prevent falls. This information is not intended to replace advice given to you by your health care provider. Make sure you discuss any questions you have with your health care provider. Document Released: 08/13/2009 Document Revised: 03/24/2016 Document Reviewed: 11/21/2014 Elsevier Interactive Patient Education  2017 [employer].

## 2021-07-08 ENCOUNTER — Ambulatory Visit (INDEPENDENT_AMBULATORY_CARE_PROVIDER_SITE_OTHER): Payer: Medicare Other | Admitting: Sports Medicine

## 2021-07-08 ENCOUNTER — Encounter: Payer: Self-pay | Admitting: Sports Medicine

## 2021-07-08 DIAGNOSIS — I10 Essential (primary) hypertension: Secondary | ICD-10-CM | POA: Diagnosis not present

## 2021-07-08 DIAGNOSIS — F329 Major depressive disorder, single episode, unspecified: Secondary | ICD-10-CM | POA: Diagnosis not present

## 2021-07-08 MED ORDER — TRAZODONE HCL 100 MG PO TABS: 100.0000 mg | ORAL_TABLET | Freq: Every day | ORAL | 3 refills | Status: DC

## 2021-07-08 MED ORDER — VENLAFAXINE HCL ER 75 MG PO CP24: 225.0000 mg | ORAL_CAPSULE | Freq: Every day | ORAL | 11 refills | Status: DC

## 2021-07-08 MED ORDER — VALSARTAN-HYDROCHLOROTHIAZIDE 320-25 MG PO TABS: ORAL_TABLET | ORAL | 3 refills | Status: DC

## 2021-07-08 NOTE — Progress Notes (Signed)
    Procedures performed today:    None.  Independent interpretation of notes and tests performed by another provider:   None.  Brief History, Exam, Impression, and Recommendations:    Benign essential hypertension [name] returns, at the previous visit we had stopped [name], and [name] as she had become significantly prerenal. She has regained the fluid weight, blood pressure has come back up as expected with improved oral intake. Restarting valsartan/hydrochlorothiazide, she will follow-up with me in just under 2 weeks to recheck blood pressure and weight. If needed we can restart [name] as well, we would most likely restart at 10 mg daily rather than [name] 40 mg maintenance dose. Lungs are clear, she does have a bit of lower extremity edema but I do not think she is in a heart failure exacerbation.  Depression, major Still with significant grief and depressive symptoms, increasing Effexor to 225 mg daily, refilling trazodone.  Chronic process with exacerbation and pharmacologic intervention.  ___________________________________________ [name]. [name], M.D., ABFM., CAQSM. Primary Care and [place] Instructor of [place] of [facility] of Medicine

## 2021-07-08 NOTE — Assessment & Plan Note (Signed)
[name] returns, at the previous visit we had stopped her valsartan/HCTZ, and her Lasix as she had become significantly prerenal. She has regained the fluid weight, blood pressure has come back up as expected with improved oral intake. Restarting valsartan/hydrochlorothiazide, she will follow-up with me in just under 2 weeks to recheck blood pressure and weight. If needed we can restart her Lasix as well, we would most likely restart at 10 mg daily rather than her 40 mg maintenance dose. Lungs are clear, she does have a bit of lower extremity edema but I do not think she is in a heart failure exacerbation.

## 2021-07-08 NOTE — Assessment & Plan Note (Signed)
Still with significant grief and depressive symptoms, increasing Effexor to 225 mg daily, refilling trazodone.

## 2021-07-23 ENCOUNTER — Encounter: Payer: Self-pay | Admitting: Sports Medicine

## 2021-07-23 ENCOUNTER — Other Ambulatory Visit: Payer: Self-pay

## 2021-07-23 ENCOUNTER — Ambulatory Visit (INDEPENDENT_AMBULATORY_CARE_PROVIDER_SITE_OTHER): Payer: Medicare Other | Admitting: Sports Medicine

## 2021-07-23 DIAGNOSIS — I10 Essential (primary) hypertension: Secondary | ICD-10-CM

## 2021-07-23 MED ORDER — FUROSEMIDE 20 MG PO TABS: 10.0000 mg | ORAL_TABLET | Freq: Every day | ORAL | 3 refills | Status: DC

## 2021-07-23 NOTE — Assessment & Plan Note (Signed)
[name] returns, she is a very pleasant 70 year old female, at previous visits we had stopped her valsartan/HCTZ and Lasix, she had become significantly prerenal. We ran some fluids, she regained fluid weight, blood pressure went up significantly. We restarted her valsartan/HCTZ, she has lost 9 pounds, likely all fluid, blood pressure improved from the 190s to the 150s, because her BP is still significantly elevated and she does have some pitting edema we will restart Lasix at 10 mg daily with a 2-week nurse visit blood pressure follow-up.

## 2021-07-23 NOTE — Progress Notes (Signed)
    Procedures performed today:    None.  Independent interpretation of notes and tests performed by another provider:   None.  Brief History, Exam, Impression, and Recommendations:    Benign essential hypertension [name] returns, she is a very pleasant 70 year old female, at previous visits we had stopped her valsartan/HCTZ and Lasix, she had become significantly prerenal. We ran some fluids, she regained fluid weight, blood pressure went up significantly. We restarted her valsartan/HCTZ, she has lost 9 pounds, likely all fluid, blood pressure improved from the 190s to the 150s, because her BP is still significantly elevated and she does have some pitting edema we will restart Lasix at 10 mg daily with a 2-week nurse visit blood pressure follow-up.    ___________________________________________ [name], M.D., ABFM., CAQSM. Primary Care and [place] Instructor of [place] of [facility] of Medicine

## 2021-08-09 ENCOUNTER — Ambulatory Visit: Payer: Medicare Other

## 2021-08-23 ENCOUNTER — Other Ambulatory Visit: Payer: Self-pay

## 2021-08-23 ENCOUNTER — Ambulatory Visit (INDEPENDENT_AMBULATORY_CARE_PROVIDER_SITE_OTHER): Payer: Medicare Other | Admitting: Sports Medicine

## 2021-08-23 DIAGNOSIS — I10 Essential (primary) hypertension: Secondary | ICD-10-CM

## 2021-08-23 DIAGNOSIS — M48061 Spinal stenosis, lumbar region without neurogenic claudication: Secondary | ICD-10-CM | POA: Diagnosis not present

## 2021-08-23 DIAGNOSIS — Z0000 Encounter for general adult medical examination without abnormal findings: Secondary | ICD-10-CM

## 2021-08-23 NOTE — Progress Notes (Addendum)
    Procedures performed today:    None.  Independent interpretation of notes and tests performed by another provider:   None.  Brief History, Exam, Impression, and Recommendations:    Benign essential hypertension [name] returns, blood pressure is now really well controlled on valsartan/HCTZ, she is down to a half tab of Lasix, she will discontinue this and only use it as needed for swelling, return to see me in 6 months.  Annual physical exam Return in 6 months at which point we can get [name] caught up on the rest of [name] screening.  Lumbar spinal stenosis [name] returns, she has a remarkably normal for age lumbar spine MRI, gabapentin was ineffective and caused excessive fatigue. Our plan was to consider injections of both of [name] sacroiliac joints, we can still do this in the future but I would like [name] to get working with physical therapy predominantly for dry needling and TENS therapy. Return in 4 to 6 weeks for this.    ___________________________________________ [name]. [name], M.D., ABFM., CAQSM. Primary Care and [place] Instructor of [place] of [facility] of Medicine

## 2021-08-23 NOTE — Assessment & Plan Note (Signed)
Return in 6 months at which point we can get her caught up on the rest of her screening.

## 2021-08-23 NOTE — Addendum Note (Signed)
Addended by: Silverio Decamp on: 08/23/2021 03:40 PM   Modules accepted: Orders

## 2021-08-23 NOTE — Assessment & Plan Note (Signed)
[name] returns, she has a remarkably normal for age lumbar spine MRI, gabapentin was ineffective and caused excessive fatigue. Our plan was to consider injections of both of her sacroiliac joints, we can still do this in the future but I would like her to get working with physical therapy predominantly for dry needling and TENS therapy. Return in 4 to 6 weeks for this.

## 2021-08-23 NOTE — Assessment & Plan Note (Signed)
[name] returns, blood pressure is now really well controlled on valsartan/HCTZ, she is down to a half tab of Lasix, she will discontinue this and only use it as needed for swelling, return to see me in 6 months.

## 2021-08-30 ENCOUNTER — Ambulatory Visit: Payer: Medicare Other | Admitting: Physical Therapy

## 2021-09-02 ENCOUNTER — Other Ambulatory Visit: Payer: Self-pay | Admitting: Sports Medicine

## 2021-09-02 DIAGNOSIS — E039 Hypothyroidism, unspecified: Secondary | ICD-10-CM

## 2021-09-05 IMAGING — DX DG KNEE COMPLETE 4+V*L*
4 series · 4 of 4 positions shown · non-contrast
Comparison: None.

CLINICAL DATA: Bilateral anterior knee pain for 1 month. No known
injury.

EXAM:
LEFT KNEE - COMPLETE 4+ VIEW

[tunnel]
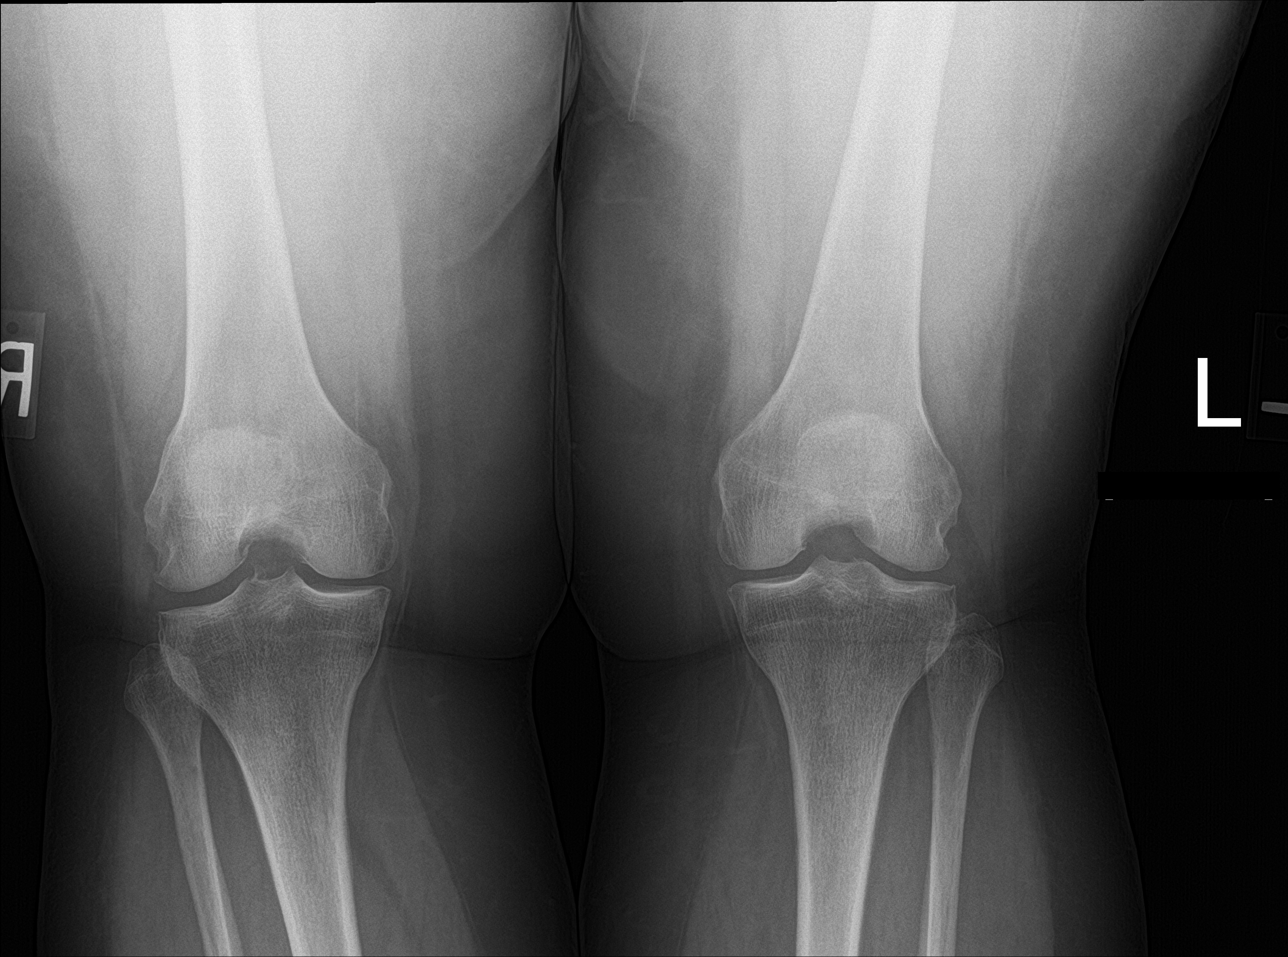

[knee lat]
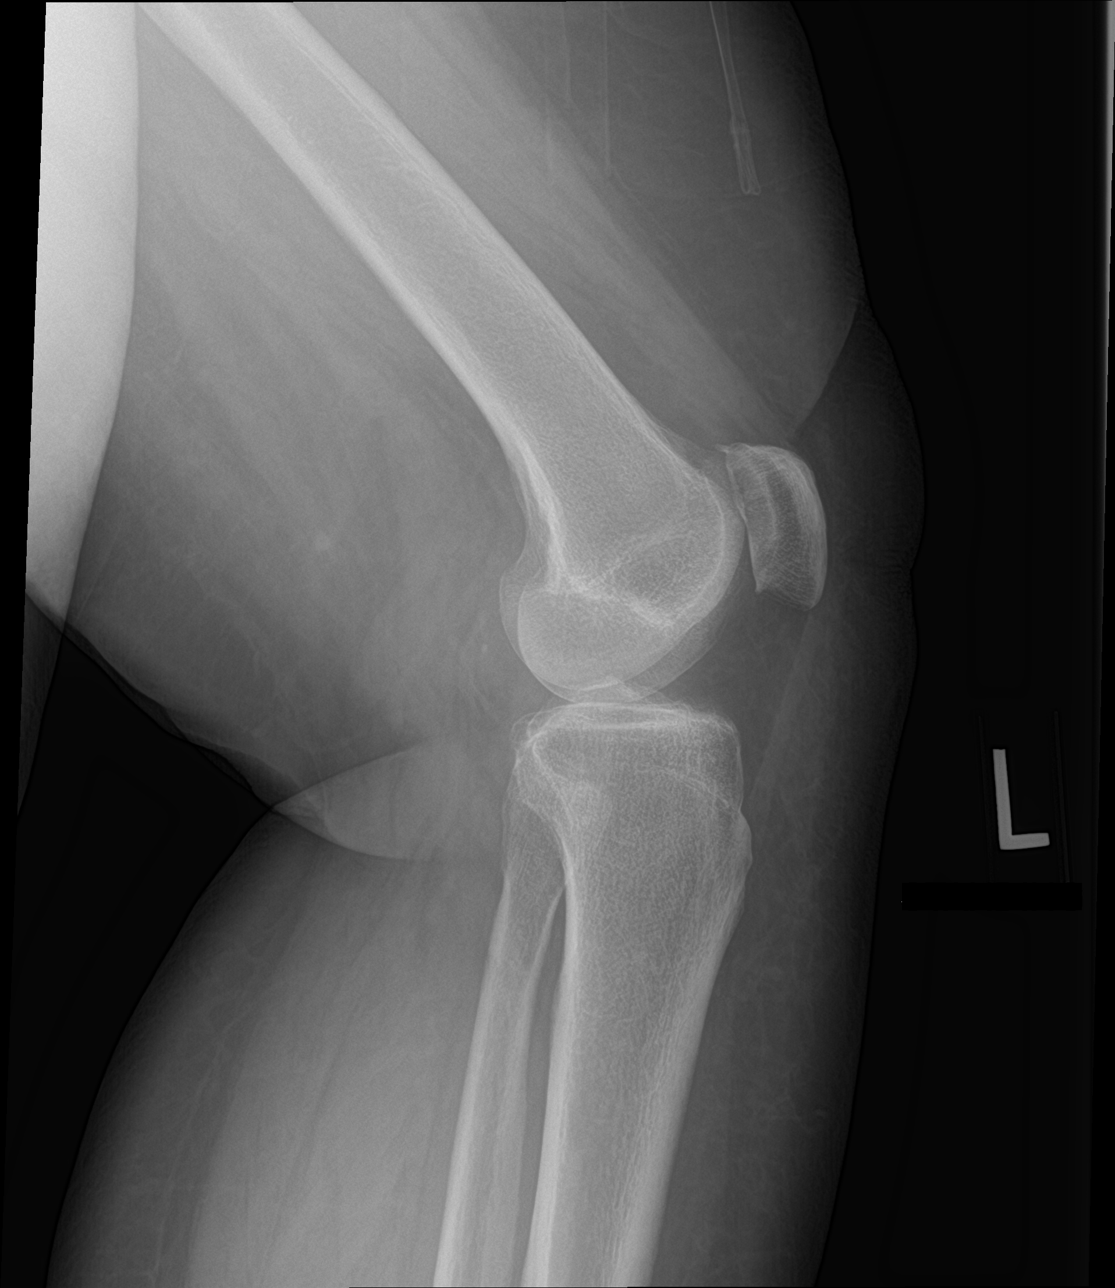

[knee sunrise]
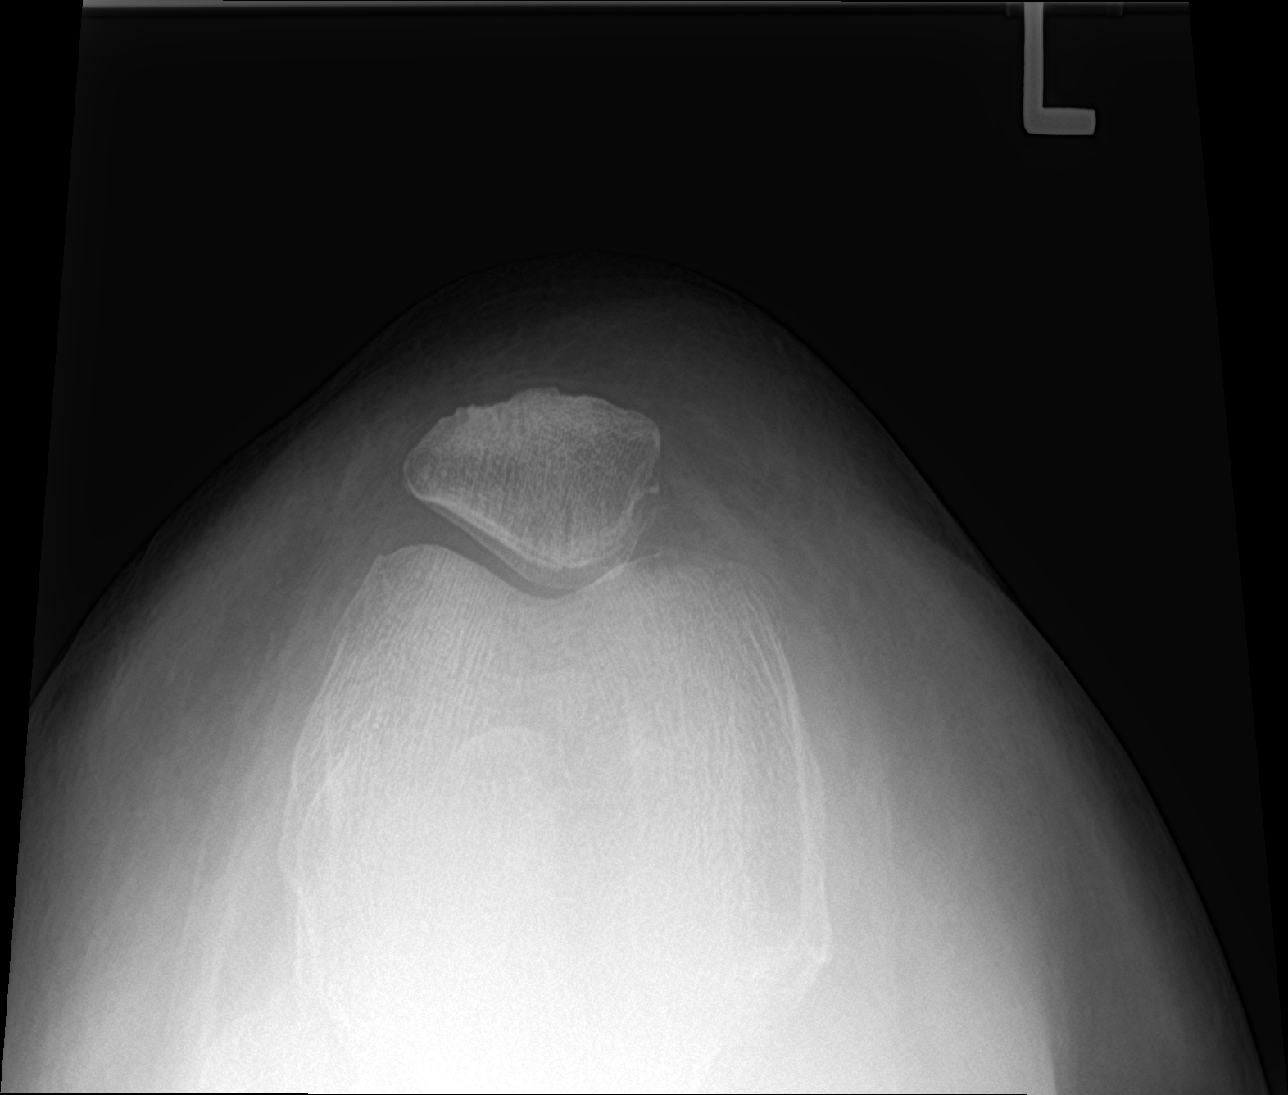

[knee ap bilat standing]
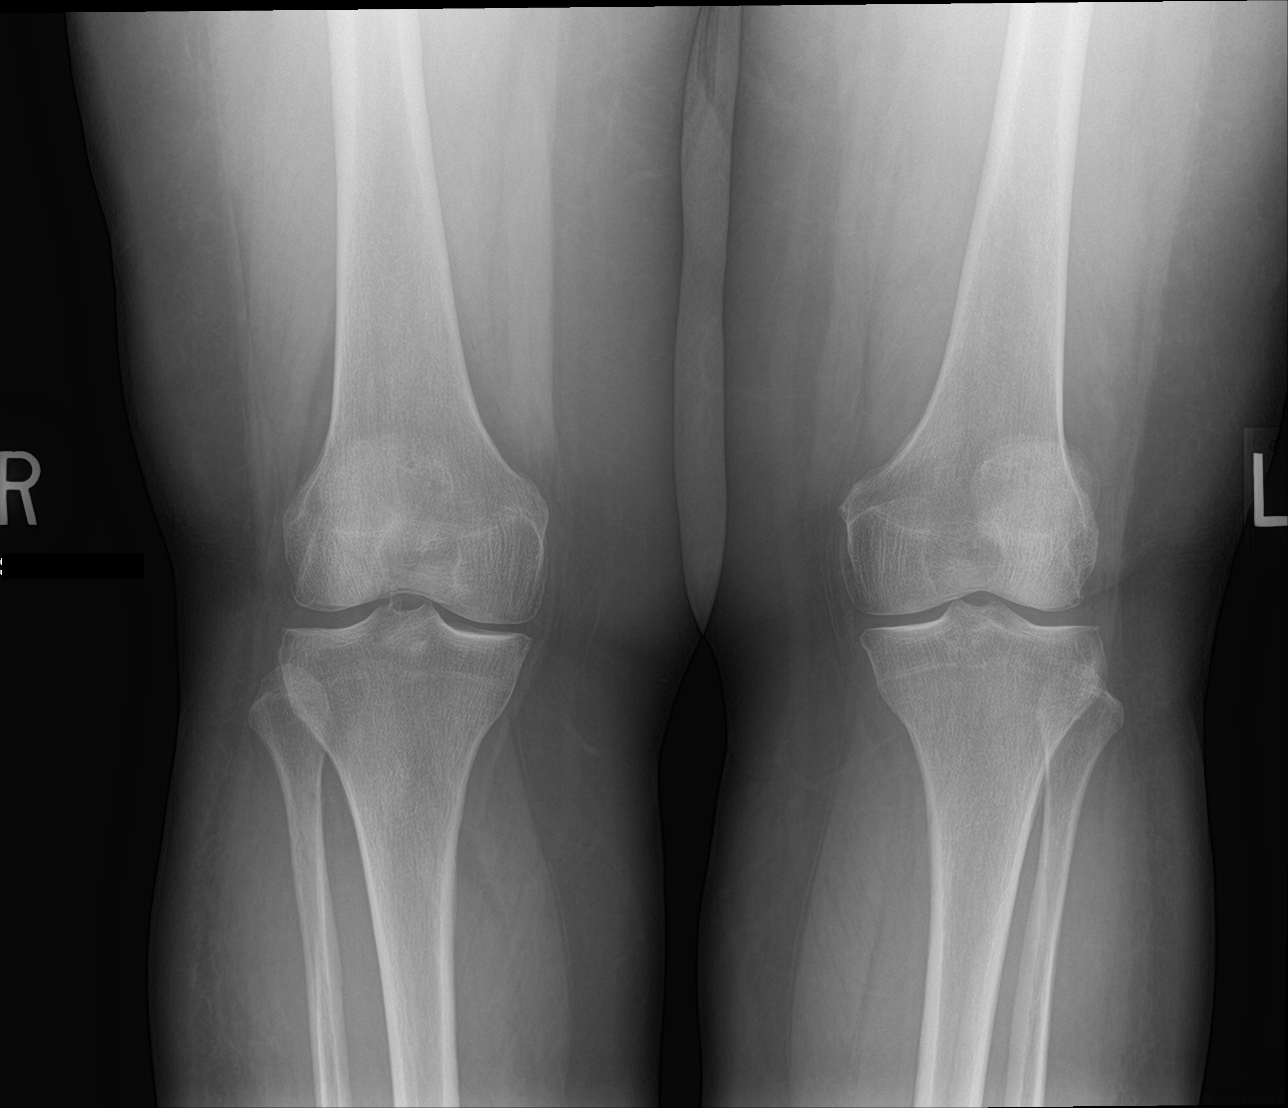

[4 of 4 positions shown; findings below may reference images not displayed]

FINDINGS: Imaging obtained standing. Mild tricompartmental peripheral
spurring. Minimal medial patellofemoral joint space narrowing. The
joint spaces are otherwise preserved. No fracture, erosion,
periosteal reaction or evidence of focal bone lesion. Trace joint
effusion. No other soft tissue abnormality.
IMPRESSION: Mild tricompartmental osteoarthritis. Trace joint effusion.

## 2021-09-05 IMAGING — DX DG KNEE COMPLETE 4+V*R*
4 series · 4 of 4 positions shown · non-contrast
Comparison: None.

CLINICAL DATA: Bilateral anterior knee pain for 1 month.

EXAM:
RIGHT KNEE - COMPLETE 4+ VIEW

[tunnel]
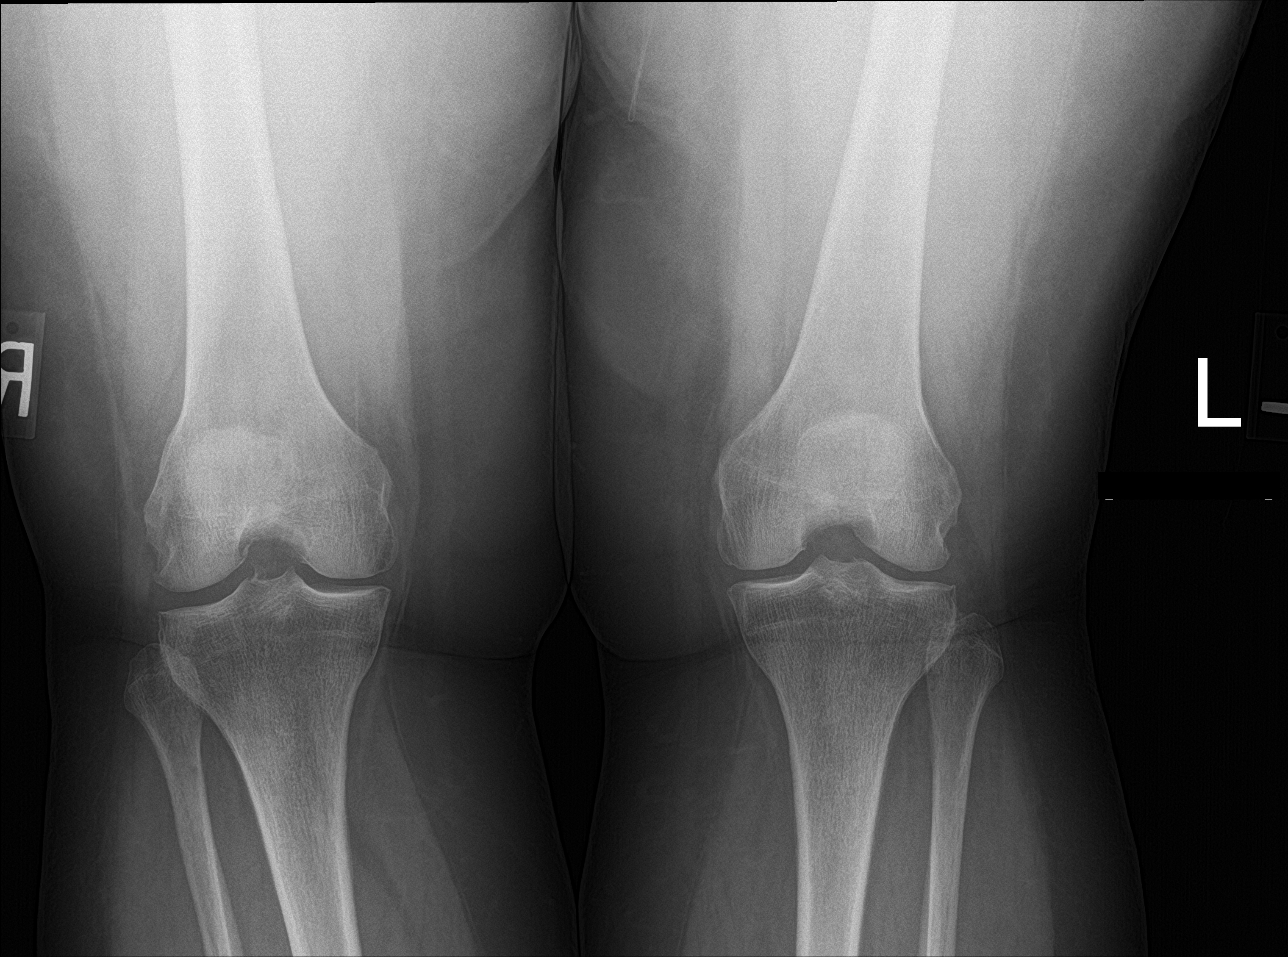

[knee lat]
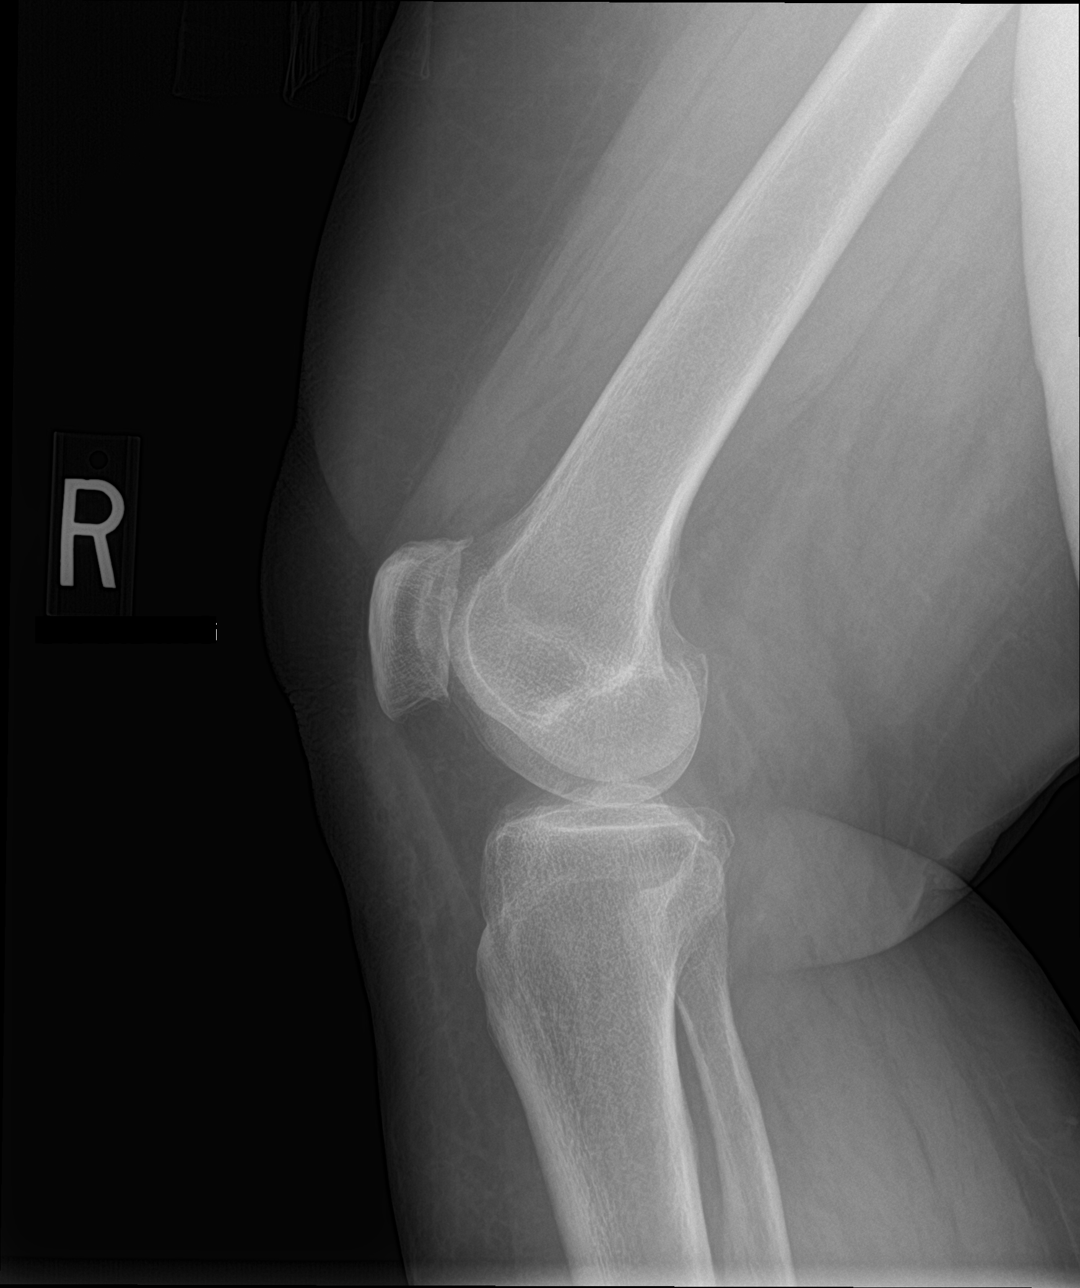

[knee sunrise]
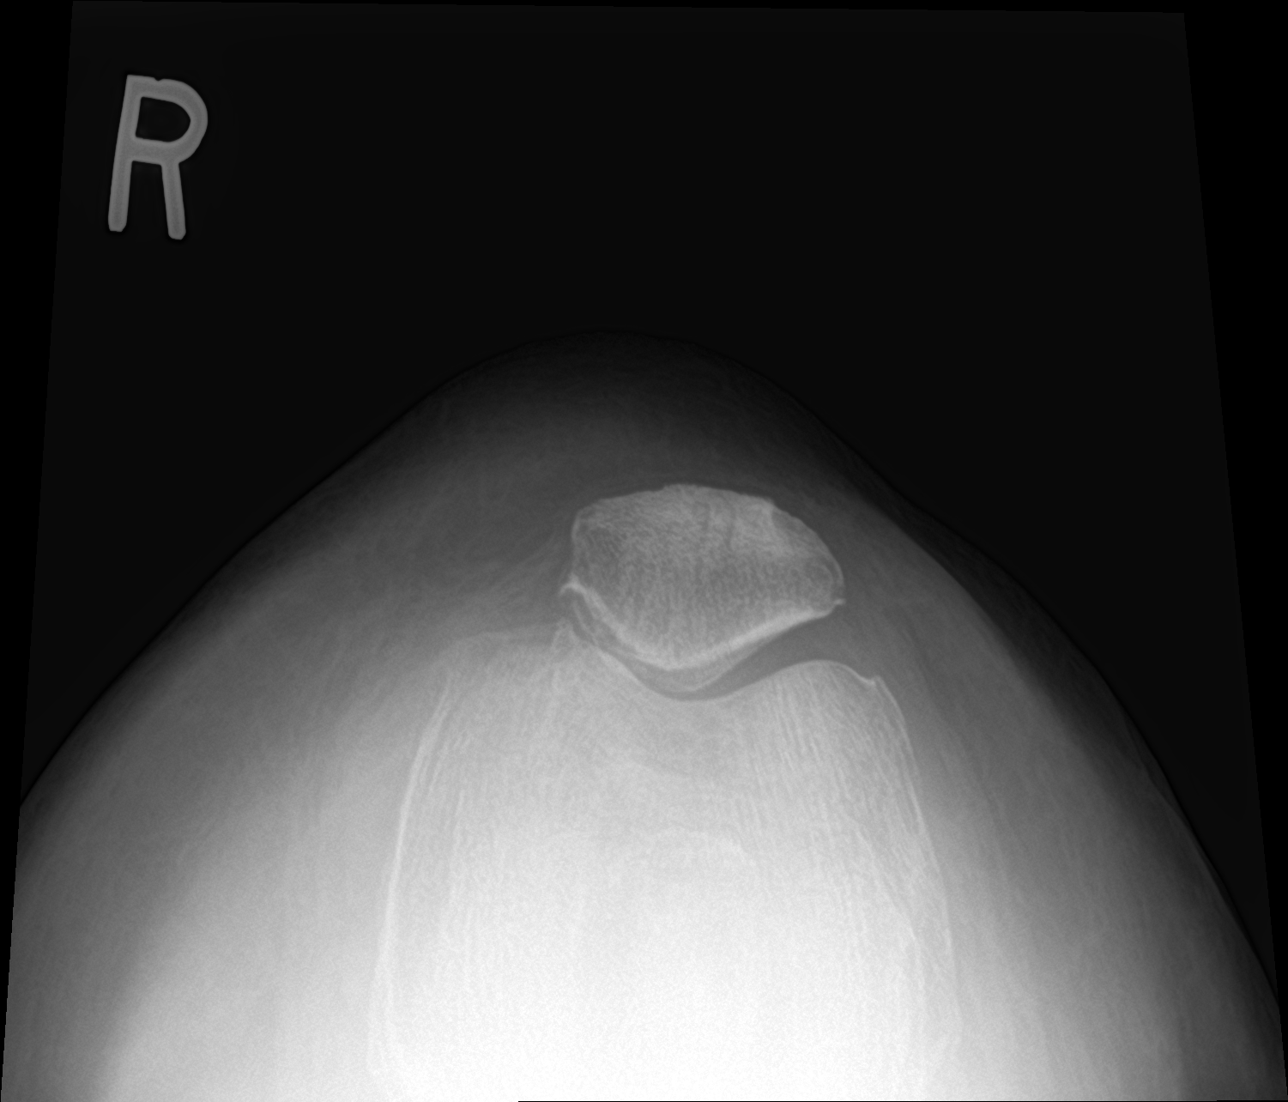

[knee ap bilat standing]
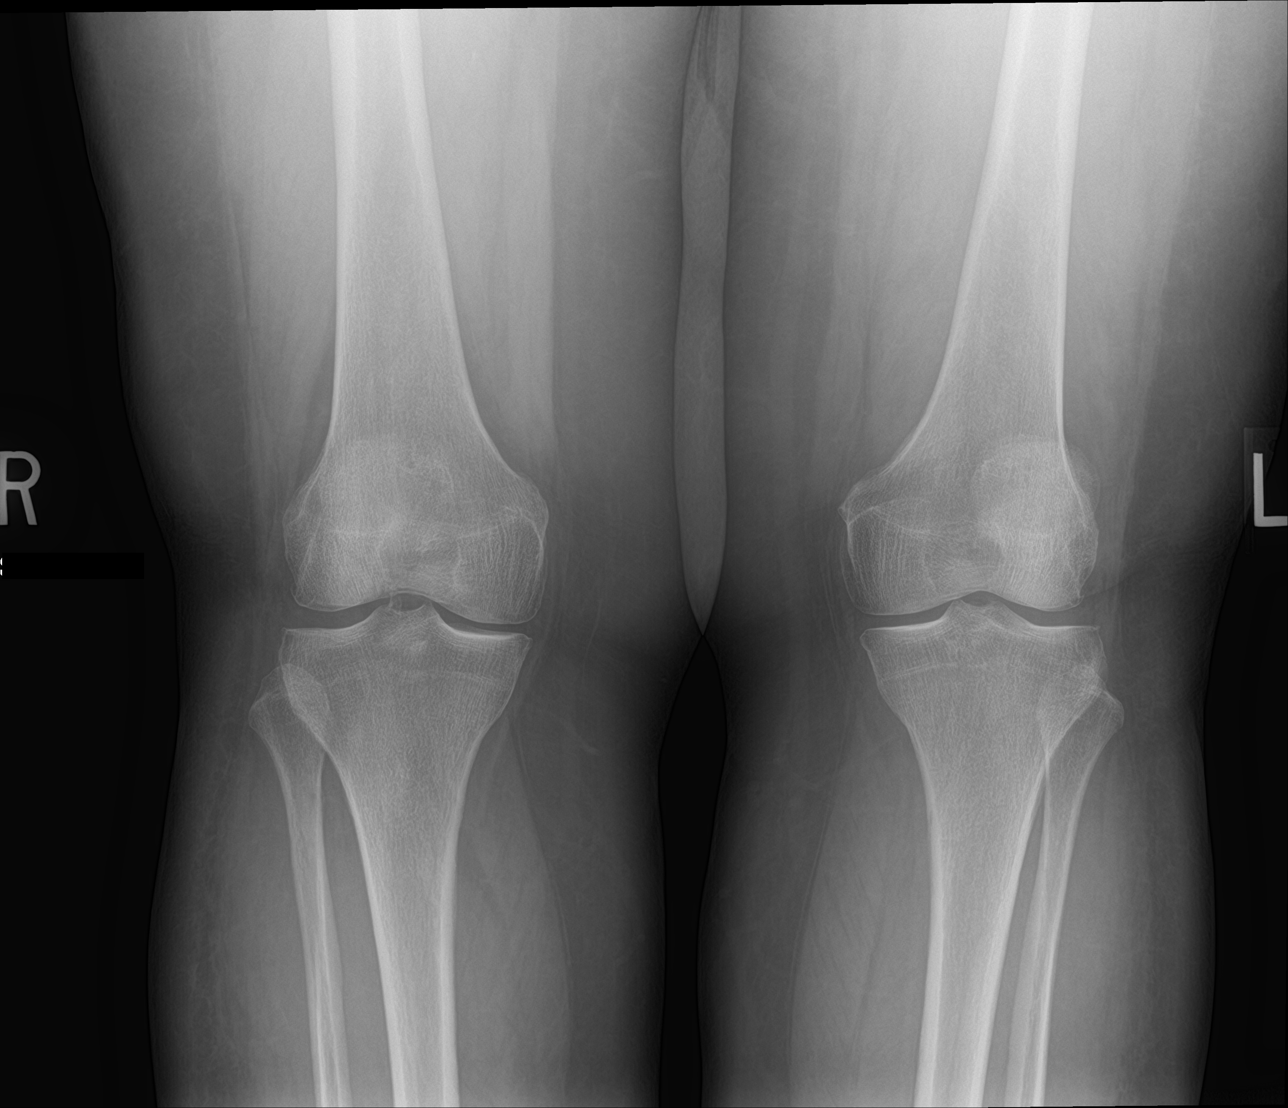

[4 of 4 positions shown; findings below may reference images not displayed]

FINDINGS: Imaging obtained standing. Mild tricompartmental peripheral spurring
most prominent in the patellofemoral compartment. There is mild
medial patellofemoral joint space narrowing. Minimal medial
tibiofemoral joint space narrowing. No fracture, erosion, or
evidence of focal bone lesion. Trace joint effusion. No other soft
tissue abnormality.
IMPRESSION: Mild tricompartmental osteoarthritis, most prominent in the
patellofemoral compartment. Trace joint effusion.

## 2021-09-06 ENCOUNTER — Telehealth: Payer: Self-pay

## 2021-09-06 NOTE — Telephone Encounter (Signed)
Medication: ELIQUIS 5 MG TABS tablet Prior authorization submitted via CoverMyMeds on 09/06/2021 PA submission pending

## 2021-09-28 NOTE — Telephone Encounter (Signed)
PA for  ELIQUIS 5 MG TABS tablet was cancelled by [employer]. I called them and they said the pt's benefits termed on 08/30/21. I called Kristopher Oppenheim and they said that the pt picked up a refill on 09/06/21 and Medicare did pay for the Rx.

## 2021-11-12 ENCOUNTER — Telehealth: Payer: Self-pay

## 2021-11-12 MED ORDER — AMBULATORY NON FORMULARY MEDICATION: 0 refills | Status: DC

## 2021-11-12 MED ORDER — AMBULATORY NON FORMULARY MEDICATION: 0 refills | Status: AC

## 2021-11-12 NOTE — Telephone Encounter (Signed)
Patient called requesting prescriptions for a scooter to help with her mobility and a shower seat.

## 2021-11-12 NOTE — Telephone Encounter (Signed)
Rx written, she needs to find a scooter company and they will probably require a mobility evaluation.

## 2021-11-12 NOTE — Telephone Encounter (Signed)
[name] was advised to contact her insurance provider to see what her next steps are for procuring a scooter. She verbalized an understandng.

## 2021-11-22 ENCOUNTER — Encounter: Payer: Self-pay | Admitting: Sports Medicine

## 2021-11-22 ENCOUNTER — Ambulatory Visit (INDEPENDENT_AMBULATORY_CARE_PROVIDER_SITE_OTHER): Payer: Medicare Other | Admitting: Sports Medicine

## 2021-11-22 ENCOUNTER — Other Ambulatory Visit: Payer: Self-pay

## 2021-11-22 DIAGNOSIS — I4819 Other persistent atrial fibrillation: Secondary | ICD-10-CM | POA: Diagnosis not present

## 2021-11-22 DIAGNOSIS — R0789 Other chest pain: Secondary | ICD-10-CM | POA: Diagnosis not present

## 2021-11-22 DIAGNOSIS — F5101 Primary insomnia: Secondary | ICD-10-CM | POA: Diagnosis not present

## 2021-11-22 DIAGNOSIS — E782 Mixed hyperlipidemia: Secondary | ICD-10-CM

## 2021-11-22 DIAGNOSIS — I071 Rheumatic tricuspid insufficiency: Secondary | ICD-10-CM | POA: Diagnosis not present

## 2021-11-22 DIAGNOSIS — I10 Essential (primary) hypertension: Secondary | ICD-10-CM | POA: Diagnosis not present

## 2021-11-22 MED ORDER — PRAVASTATIN SODIUM 10 MG PO TABS: 10.0000 mg | ORAL_TABLET | Freq: Every day | ORAL | 3 refills | Status: DC

## 2021-11-22 MED ORDER — WEGOVY 0.25 MG/0.5ML ~~LOC~~ SOAJ: 0.2500 mg | SUBCUTANEOUS | 0 refills | Status: DC

## 2021-11-22 MED ORDER — RAMELTEON 8 MG PO TABS: ORAL_TABLET | ORAL | 0 refills | Status: DC

## 2021-11-22 NOTE — Progress Notes (Signed)
° ° °  Procedures performed today:    None.  Independent interpretation of notes and tests performed by another provider:   None.  Brief History, Exam, Impression, and Recommendations:    Idiopathic insomnia [name] is on trazodone, she has persistent insomnia, often does not go to sleep until 1 or 2 AM. On further questioning she eats dinner about 8:00, and watches TV until at least midnight or 1:00. I explained to her the importance of avoiding bright screens at night, as this interferes with melatonin secretion, we will try to reset her cyclical rhythms with Rozerem at around 6:00 for the next 2 weeks, and see if this helps her get to sleep, she is going to avoid bright screens after this time. We can revisit this in a few weeks.  Mixed hyperlipidemia Widespread aches and pains, currently on atorvastatin, we will discontinue this and switch to low-dose pravastatin.  Morbid obesity ([place]) [name] is morbidly obese, she needs to lose a lot of weight, this will likely help multiple medical comorbidities. She will be working in a multidisciplinary weight loss plan, she will be counting calories, she has an exercise prescription, she has tried other measures such as dieting and classes without much improvement. She will not be using any other GLP-1's. We are going to try [facility] first.    ___________________________________________ [name]. [name], M.D., ABFM., CAQSM. Primary Care and [place] Instructor of [place] of [facility] of Medicine

## 2021-11-22 NOTE — Assessment & Plan Note (Signed)
Widespread aches and pains, currently on atorvastatin, we will discontinue this and switch to low-dose pravastatin.

## 2021-11-22 NOTE — Assessment & Plan Note (Signed)
[name] is morbidly obese, she needs to lose a lot of weight, this will likely help multiple medical comorbidities. She will be working in a multidisciplinary weight loss plan, she will be counting calories, she has an exercise prescription, she has tried other measures such as dieting and classes without much improvement. She will not be using any other GLP-1's. We are going to try [facility] first.

## 2021-11-22 NOTE — Assessment & Plan Note (Signed)
[name] is on trazodone, she has persistent insomnia, often does not go to sleep until 1 or 2 AM. On further questioning she eats dinner about 8:00, and watches TV until at least midnight or 1:00. I explained to her the importance of avoiding bright screens at night, as this interferes with melatonin secretion, we will try to reset her cyclical rhythms with Rozerem at around 6:00 for the next 2 weeks, and see if this helps her get to sleep, she is going to avoid bright screens after this time. We can revisit this in a few weeks.

## 2021-12-03 ENCOUNTER — Telehealth: Payer: Self-pay

## 2021-12-03 NOTE — Telephone Encounter (Addendum)
Initiated Prior authorization for: WEGOVY 0.25 MG/0.5ML  Via: Covermymeds Case/Key:[id number] - [id number] Status: Denied as of 12/03/20 Reason:Drugs when used for anorexia, weight loss, or weight gain are excluded from coverage under Medicare rules. Please refer to your Evidence of Coverage (EOC) section that references Part D drug coverage in your pharmacy plan documents for more information. Notified Pt via: Mychart

## 2021-12-20 ENCOUNTER — Ambulatory Visit: Payer: Medicare Other | Admitting: Sports Medicine

## 2021-12-31 ENCOUNTER — Other Ambulatory Visit: Payer: Self-pay | Admitting: Sports Medicine

## 2021-12-31 DIAGNOSIS — M48061 Spinal stenosis, lumbar region without neurogenic claudication: Secondary | ICD-10-CM

## 2022-01-24 ENCOUNTER — Other Ambulatory Visit: Payer: Self-pay

## 2022-01-24 ENCOUNTER — Ambulatory Visit (INDEPENDENT_AMBULATORY_CARE_PROVIDER_SITE_OTHER): Payer: Medicare Other | Admitting: Sports Medicine

## 2022-01-24 ENCOUNTER — Encounter: Payer: Self-pay | Admitting: Sports Medicine

## 2022-01-24 DIAGNOSIS — M48061 Spinal stenosis, lumbar region without neurogenic claudication: Secondary | ICD-10-CM | POA: Diagnosis not present

## 2022-01-24 DIAGNOSIS — F5101 Primary insomnia: Secondary | ICD-10-CM | POA: Diagnosis not present

## 2022-01-24 DIAGNOSIS — E039 Hypothyroidism, unspecified: Secondary | ICD-10-CM

## 2022-01-24 MED ORDER — RAMELTEON 8 MG PO TABS: ORAL_TABLET | ORAL | 3 refills | Status: AC

## 2022-01-24 MED ORDER — LEVOTHYROXINE SODIUM 75 MCG PO TABS: 75.0000 ug | ORAL_TABLET | Freq: Every day | ORAL | 3 refills | Status: DC

## 2022-01-24 MED ORDER — GABAPENTIN 600 MG PO TABS: 1200.0000 mg | ORAL_TABLET | Freq: Every day | ORAL | 3 refills | Status: DC

## 2022-01-24 NOTE — Progress Notes (Signed)
? ? ?  Procedures performed today:   ? ?None. ? ?Independent interpretation of notes and tests performed by another provider:  ? ?None. ? ?Brief History, Exam, Impression, and Recommendations:   ? ?Morbid obesity ([place]) ?Patient with morbid obesity, GLP-1's not approved and are in fact a plan exclusion. ?I would like her to work with bariatric surgery to see if we can get her approved for gastric sleeve. ? ?Idiopathic insomnia ?Syanna returns, she was on trazodone but with persistent insomnia, we discussed sleep hygiene and a more regular schedule at the last visit. ?I also added Rozerem which seems to keep her asleep until about 4 AM. ?She did around the same time run out of her gabapentin which she was only taking at night, I think if she restarts her gabapentin, 1200 mg at night and keep her sleep longer. ? ?Chronic process with exacerbation and pharmacologic intervention ? ?___________________________________________ ?[name]. [name], M.D., ABFM., CAQSM. ?Primary Care and Sports Medicine ?[place] ? ?Adjunct Instructor of Family Medicine  ?[employer] ?

## 2022-01-24 NOTE — Assessment & Plan Note (Signed)
[name] returns, she was on trazodone but with persistent insomnia, we discussed sleep hygiene and a more regular schedule at the last visit. ?I also added Rozerem which seems to keep her asleep until about 4 AM. ?She did around the same time run out of her gabapentin which she was only taking at night, I think if she restarts her gabapentin, 1200 mg at night and keep her sleep longer. ? ?

## 2022-01-24 NOTE — Assessment & Plan Note (Signed)
Patient with morbid obesity, GLP-1's not approved and are in fact a plan exclusion. ?I would like her to work with bariatric surgery to see if we can get her approved for gastric sleeve. ?

## 2022-02-21 DIAGNOSIS — H905 Unspecified sensorineural hearing loss: Secondary | ICD-10-CM | POA: Diagnosis not present

## 2022-03-01 ENCOUNTER — Other Ambulatory Visit: Payer: Self-pay | Admitting: Sports Medicine

## 2022-03-01 DIAGNOSIS — F329 Major depressive disorder, single episode, unspecified: Secondary | ICD-10-CM

## 2022-03-14 DIAGNOSIS — H2513 Age-related nuclear cataract, bilateral: Secondary | ICD-10-CM | POA: Diagnosis not present

## 2022-03-14 DIAGNOSIS — I10 Essential (primary) hypertension: Secondary | ICD-10-CM | POA: Diagnosis not present

## 2022-03-14 DIAGNOSIS — H2511 Age-related nuclear cataract, right eye: Secondary | ICD-10-CM | POA: Diagnosis not present

## 2022-03-22 ENCOUNTER — Encounter: Payer: Self-pay | Admitting: Sports Medicine

## 2022-03-27 ENCOUNTER — Other Ambulatory Visit: Payer: Self-pay | Admitting: Sports Medicine

## 2022-03-27 DIAGNOSIS — E782 Mixed hyperlipidemia: Secondary | ICD-10-CM

## 2022-04-06 ENCOUNTER — Other Ambulatory Visit: Payer: Self-pay

## 2022-04-06 ENCOUNTER — Other Ambulatory Visit: Payer: Self-pay | Admitting: Sports Medicine

## 2022-04-06 DIAGNOSIS — M48061 Spinal stenosis, lumbar region without neurogenic claudication: Secondary | ICD-10-CM

## 2022-04-06 NOTE — Telephone Encounter (Signed)
Patient is leaving to go out of town on Friday and needs these to take with her.

## 2022-04-21 ENCOUNTER — Other Ambulatory Visit: Payer: Self-pay | Admitting: Sports Medicine

## 2022-04-21 DIAGNOSIS — E782 Mixed hyperlipidemia: Secondary | ICD-10-CM

## 2022-04-26 ENCOUNTER — Other Ambulatory Visit: Payer: Self-pay | Admitting: Sports Medicine

## 2022-04-26 DIAGNOSIS — E782 Mixed hyperlipidemia: Secondary | ICD-10-CM

## 2022-04-28 ENCOUNTER — Other Ambulatory Visit: Payer: Self-pay | Admitting: Sports Medicine

## 2022-04-28 DIAGNOSIS — K219 Gastro-esophageal reflux disease without esophagitis: Secondary | ICD-10-CM

## 2022-04-28 DIAGNOSIS — H2513 Age-related nuclear cataract, bilateral: Secondary | ICD-10-CM | POA: Diagnosis not present

## 2022-04-28 DIAGNOSIS — H2511 Age-related nuclear cataract, right eye: Secondary | ICD-10-CM | POA: Diagnosis not present

## 2022-04-29 DIAGNOSIS — H2512 Age-related nuclear cataract, left eye: Secondary | ICD-10-CM | POA: Diagnosis not present

## 2022-05-02 ENCOUNTER — Other Ambulatory Visit: Payer: Self-pay | Admitting: Sports Medicine

## 2022-05-02 DIAGNOSIS — E782 Mixed hyperlipidemia: Secondary | ICD-10-CM

## 2022-05-08 DIAGNOSIS — Z7901 Long term (current) use of anticoagulants: Secondary | ICD-10-CM | POA: Diagnosis not present

## 2022-05-08 DIAGNOSIS — E039 Hypothyroidism, unspecified: Secondary | ICD-10-CM | POA: Diagnosis not present

## 2022-05-08 DIAGNOSIS — E782 Mixed hyperlipidemia: Secondary | ICD-10-CM | POA: Diagnosis not present

## 2022-05-08 DIAGNOSIS — E871 Hypo-osmolality and hyponatremia: Secondary | ICD-10-CM | POA: Diagnosis not present

## 2022-05-08 DIAGNOSIS — D62 Acute posthemorrhagic anemia: Secondary | ICD-10-CM | POA: Diagnosis not present

## 2022-05-08 DIAGNOSIS — Z85828 Personal history of other malignant neoplasm of skin: Secondary | ICD-10-CM | POA: Diagnosis not present

## 2022-05-08 DIAGNOSIS — K529 Noninfective gastroenteritis and colitis, unspecified: Secondary | ICD-10-CM | POA: Diagnosis not present

## 2022-05-08 DIAGNOSIS — E872 Acidosis, unspecified: Secondary | ICD-10-CM | POA: Diagnosis not present

## 2022-05-08 DIAGNOSIS — R531 Weakness: Secondary | ICD-10-CM | POA: Diagnosis not present

## 2022-05-08 DIAGNOSIS — G894 Chronic pain syndrome: Secondary | ICD-10-CM | POA: Diagnosis not present

## 2022-05-08 DIAGNOSIS — K5289 Other specified noninfective gastroenteritis and colitis: Secondary | ICD-10-CM | POA: Diagnosis not present

## 2022-05-08 DIAGNOSIS — R7303 Prediabetes: Secondary | ICD-10-CM | POA: Diagnosis not present

## 2022-05-08 DIAGNOSIS — I700 Atherosclerosis of aorta: Secondary | ICD-10-CM | POA: Diagnosis not present

## 2022-05-08 DIAGNOSIS — J9811 Atelectasis: Secondary | ICD-10-CM | POA: Diagnosis not present

## 2022-05-08 DIAGNOSIS — I10 Essential (primary) hypertension: Secondary | ICD-10-CM | POA: Diagnosis not present

## 2022-05-08 DIAGNOSIS — W19XXXD Unspecified fall, subsequent encounter: Secondary | ICD-10-CM | POA: Diagnosis not present

## 2022-05-08 DIAGNOSIS — R1011 Right upper quadrant pain: Secondary | ICD-10-CM | POA: Diagnosis not present

## 2022-05-08 DIAGNOSIS — K6389 Other specified diseases of intestine: Secondary | ICD-10-CM | POA: Diagnosis not present

## 2022-05-08 DIAGNOSIS — I4819 Other persistent atrial fibrillation: Secondary | ICD-10-CM | POA: Diagnosis not present

## 2022-05-08 DIAGNOSIS — K449 Diaphragmatic hernia without obstruction or gangrene: Secondary | ICD-10-CM | POA: Diagnosis not present

## 2022-05-08 DIAGNOSIS — I2510 Atherosclerotic heart disease of native coronary artery without angina pectoris: Secondary | ICD-10-CM | POA: Diagnosis not present

## 2022-05-08 DIAGNOSIS — K219 Gastro-esophageal reflux disease without esophagitis: Secondary | ICD-10-CM | POA: Diagnosis not present

## 2022-05-08 DIAGNOSIS — R5383 Other fatigue: Secondary | ICD-10-CM | POA: Diagnosis not present

## 2022-05-08 DIAGNOSIS — K5900 Constipation, unspecified: Secondary | ICD-10-CM | POA: Diagnosis not present

## 2022-05-08 DIAGNOSIS — W19XXXA Unspecified fall, initial encounter: Secondary | ICD-10-CM | POA: Diagnosis not present

## 2022-05-08 DIAGNOSIS — K5730 Diverticulosis of large intestine without perforation or abscess without bleeding: Secondary | ICD-10-CM | POA: Diagnosis not present

## 2022-05-08 DIAGNOSIS — A419 Sepsis, unspecified organism: Secondary | ICD-10-CM | POA: Diagnosis not present

## 2022-05-08 DIAGNOSIS — R8290 Unspecified abnormal findings in urine: Secondary | ICD-10-CM | POA: Diagnosis not present

## 2022-05-08 DIAGNOSIS — D72829 Elevated white blood cell count, unspecified: Secondary | ICD-10-CM | POA: Diagnosis not present

## 2022-05-10 DIAGNOSIS — F411 Generalized anxiety disorder: Secondary | ICD-10-CM | POA: Insufficient documentation

## 2022-05-16 ENCOUNTER — Other Ambulatory Visit: Payer: Self-pay

## 2022-05-16 MED ORDER — AMBULATORY NON FORMULARY MEDICATION: 0 refills | Status: AC

## 2022-05-19 ENCOUNTER — Other Ambulatory Visit: Payer: Self-pay | Admitting: Sports Medicine

## 2022-05-19 DIAGNOSIS — F329 Major depressive disorder, single episode, unspecified: Secondary | ICD-10-CM

## 2022-05-21 ENCOUNTER — Other Ambulatory Visit: Payer: Self-pay | Admitting: Sports Medicine

## 2022-05-21 DIAGNOSIS — F329 Major depressive disorder, single episode, unspecified: Secondary | ICD-10-CM

## 2022-05-23 ENCOUNTER — Telehealth (INDEPENDENT_AMBULATORY_CARE_PROVIDER_SITE_OTHER): Payer: Medicare Other | Admitting: Sports Medicine

## 2022-05-23 ENCOUNTER — Other Ambulatory Visit: Payer: Self-pay

## 2022-05-23 DIAGNOSIS — K529 Noninfective gastroenteritis and colitis, unspecified: Secondary | ICD-10-CM

## 2022-05-23 DIAGNOSIS — F329 Major depressive disorder, single episode, unspecified: Secondary | ICD-10-CM

## 2022-05-23 MED ORDER — VENLAFAXINE HCL ER 75 MG PO CP24: 225.0000 mg | ORAL_CAPSULE | Freq: Every day | ORAL | 11 refills | Status: DC

## 2022-05-23 NOTE — Progress Notes (Signed)
   Virtual Visit via Telephone   I connected with  [name]  on 05/23/22 by telephone/telehealth and verified that I am speaking with the correct person using two identifiers.   I discussed the limitations, risks, security and privacy concerns of performing an evaluation and management service by telephone, including the higher likelihood of inaccurate diagnosis and treatment, and the availability of in person appointments.  We also discussed the likely need of an additional face to face encounter for complete and high quality delivery of care.  I also discussed with the patient that there may be a patient responsible charge related to this service. The patient expressed understanding and wishes to proceed.  Provider location is in medical facility. Patient location is at their home, different from provider location. People involved in care of the patient during this telehealth encounter were myself, my nurse/medical assistant, and my front office/scheduling team member.  Review of Systems: No fevers, chills, night sweats, weight loss, chest pain, or shortness of breath.   Objective Findings:    General: Speaking full sentences, no audible heavy breathing.  Sounds alert and appropriately interactive.    Independent interpretation of tests performed by another provider:   None.  Brief History, Exam, Impression, and Recommendations:    Colitis 71 year old female, multiple medical problems admitted with diarrhea and blood in the stool. Had multiple medications including blood pressure medicines discontinued. She did bleed down to a hemoglobin of about 9, up to mid tens when discharge. Also discharged on antibiotics. Still having some abdominal pain, hematochezia. Blood pressures into the 259D systolic without symptoms or signs of endorgan damage. She will go ahead and restart her valsartan/HCTZ, we will have her hold her Eliquis and she did have significant GI hemorrhage, her son will  hydrate her aggressively, she has oxycodone for pain and I can see her back at her regularly scheduled hospital follow-up visit on Wednesday. At that point we will probably reevaluate her symptoms and recheck a CBC.   I discussed the above assessment and treatment plan with the patient. The patient was provided an opportunity to ask questions and all were answered. The patient agreed with the plan and demonstrated an understanding of the instructions.   The patient was advised to call back or seek an in-person evaluation if the symptoms worsen or if the condition fails to improve as anticipated.   I provided 30 minutes of verbal and non-verbal time during this encounter date, time was needed to gather information, review chart, records, communicate/coordinate with staff remotely, as well as complete documentation.   ____________________________________________ [name]. [name], M.D., ABFM., CAQSM., AME. Primary Care and Sports Medicine [facility]  Adjunct Professor of [place] of [facility] of Medicine  Risk manager

## 2022-05-23 NOTE — Assessment & Plan Note (Signed)
71 year old female, multiple medical problems admitted with diarrhea and blood in the stool. Had multiple medications including blood pressure medicines discontinued. She did bleed down to a hemoglobin of about 9, up to mid tens when discharge. Also discharged on antibiotics. Still having some abdominal pain, hematochezia. Blood pressures into the 916X systolic without symptoms or signs of endorgan damage. She will go ahead and restart her valsartan/HCTZ, we will have her hold her Eliquis and she did have significant GI hemorrhage, her son will hydrate her aggressively, she has oxycodone for pain and I can see her back at her regularly scheduled hospital follow-up visit on Wednesday. At that point we will probably reevaluate her symptoms and recheck a CBC.

## 2022-05-25 ENCOUNTER — Encounter: Payer: Self-pay | Admitting: Sports Medicine

## 2022-05-25 ENCOUNTER — Ambulatory Visit (INDEPENDENT_AMBULATORY_CARE_PROVIDER_SITE_OTHER): Payer: Medicare Other | Admitting: Sports Medicine

## 2022-05-25 DIAGNOSIS — I10 Essential (primary) hypertension: Secondary | ICD-10-CM

## 2022-05-25 DIAGNOSIS — K529 Noninfective gastroenteritis and colitis, unspecified: Secondary | ICD-10-CM | POA: Diagnosis not present

## 2022-05-25 DIAGNOSIS — I482 Chronic atrial fibrillation, unspecified: Secondary | ICD-10-CM

## 2022-05-25 MED ORDER — ASPIRIN 81 MG PO TBEC: 81.0000 mg | DELAYED_RELEASE_TABLET | Freq: Every day | ORAL | 3 refills | Status: DC

## 2022-05-25 MED ORDER — ONDANSETRON 8 MG PO TBDP: 8.0000 mg | ORAL_TABLET | Freq: Three times a day (TID) | ORAL | 3 refills | Status: DC | PRN

## 2022-05-25 MED ORDER — OXYCODONE-ACETAMINOPHEN 5-325 MG PO TABS: 1.0000 | ORAL_TABLET | Freq: Three times a day (TID) | ORAL | 0 refills | Status: DC | PRN

## 2022-05-25 NOTE — Assessment & Plan Note (Signed)
Blood pressures were a bit soft today on valsartan/hydrochlorothiazide, half tab of furosemide. 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 We will cut her blood pressure medicine in half for the next couple of weeks, I like to do a 2 to 4-week follow-up to titrate her blood pressure medicine back up.

## 2022-05-25 NOTE — Progress Notes (Signed)
    Procedures performed today:    None.  Independent interpretation of notes and tests performed by another provider:   None.  Brief History, Exam, Impression, and Recommendations:    Colitis This is a very pleasant 71 year old female, she recently had an episode of severe abdominal pain with nausea and vomiting, diarrhea. Was seen in the ED, labs were abnormal including leukocytosis, she had a mildly elevated lactate, she had an elevated procalcitonin. Ultimately a CT abdomen pelvis/angiography showed significant pneumatosis coli right hemicolon, with no obvious evidence of superior or inferior mesenteric narrowing. GI pathogen panel was negative. She does have a history of atrial fibrillation, but has been in normal sinus rhythm. She was on Eliquis but we stopped it due to persistent and recurrent bleeding. She followed a fairly typical hospital course with resolution of pain, bleeding with improved uptrending of her hemoglobin. Today she still has some abdominal tenderness without guarding or rigidity, blood pressures are running a bit soft. I wonder if she threw a clot resulting in mesenteric insufficiency, we will restart her aspirin. Holding off on Eliquis for now. She does have a follow-up coming up with gastroenterology. I will refill her pain medicine and Zofran.  Chronic atrial fibrillation Known chronic A-fib, currently chemically cardioverted with flecainide. She did have some bleeding so Eliquis was held. While we are working up her colon issue we will go and start aspirin 81.  Benign essential hypertension Blood pressures were a bit soft today on valsartan/hydrochlorothiazide, half tab of furosemide. 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 We will cut her blood pressure medicine in half for the next couple of weeks, I like to do a 2 to 4-week follow-up to titrate her blood pressure medicine back  up.    ____________________________________________ [name]. [name], M.D., ABFM., CAQSM., AME. Primary Care and Sports Medicine [place] [facility]  Adjunct Professor of [place] [employer] [facility] of Medicine  Risk manager

## 2022-05-25 NOTE — Assessment & Plan Note (Signed)
Known chronic A-fib, currently chemically cardioverted with flecainide. She did have some bleeding so Eliquis was held. While we are working up her colon issue we will go and start aspirin 81.

## 2022-05-25 NOTE — Assessment & Plan Note (Signed)
This is a very pleasant 71 year old female, she recently had an episode of severe abdominal pain with nausea and vomiting, diarrhea. Was seen in the ED, labs were abnormal including leukocytosis, she had a mildly elevated lactate, she had an elevated procalcitonin. Ultimately a CT abdomen pelvis/angiography showed significant pneumatosis coli right hemicolon, with no obvious evidence of superior or inferior mesenteric narrowing. GI pathogen panel was negative. She does have a history of atrial fibrillation, but has been in normal sinus rhythm. She was on Eliquis but we stopped it due to persistent and recurrent bleeding. She followed a fairly typical hospital course with resolution of pain, bleeding with improved uptrending of her hemoglobin. Today she still has some abdominal tenderness without guarding or rigidity, blood pressures are running a bit soft. I wonder if she threw a clot resulting in mesenteric insufficiency, we will restart her aspirin. Holding off on Eliquis for now. She does have a follow-up coming up with gastroenterology. I will refill her pain medicine and Zofran.

## 2022-05-26 ENCOUNTER — Other Ambulatory Visit: Payer: Self-pay

## 2022-05-26 ENCOUNTER — Encounter (HOSPITAL_COMMUNITY): Payer: Self-pay

## 2022-05-26 ENCOUNTER — Inpatient Hospital Stay (HOSPITAL_COMMUNITY): Payer: Medicare Other

## 2022-05-26 ENCOUNTER — Inpatient Hospital Stay (HOSPITAL_COMMUNITY)
Admission: EM | Admit: 2022-05-26 | Discharge: 2022-06-13 | DRG: 329 | Disposition: A | Discharge status: Home Health | Payer: Medicare Other | Attending: Internal Medicine | Admitting: Internal Medicine

## 2022-05-26 ENCOUNTER — Encounter: Payer: Self-pay | Admitting: Family Medicine

## 2022-05-26 DIAGNOSIS — D62 Acute posthemorrhagic anemia: Secondary | ICD-10-CM | POA: Diagnosis not present

## 2022-05-26 DIAGNOSIS — D638 Anemia in other chronic diseases classified elsewhere: Secondary | ICD-10-CM | POA: Diagnosis not present

## 2022-05-26 DIAGNOSIS — R195 Other fecal abnormalities: Secondary | ICD-10-CM | POA: Diagnosis not present

## 2022-05-26 DIAGNOSIS — E785 Hyperlipidemia, unspecified: Secondary | ICD-10-CM | POA: Diagnosis not present

## 2022-05-26 DIAGNOSIS — Z823 Family history of stroke: Secondary | ICD-10-CM

## 2022-05-26 DIAGNOSIS — I959 Hypotension, unspecified: Secondary | ICD-10-CM | POA: Diagnosis not present

## 2022-05-26 DIAGNOSIS — L719 Rosacea, unspecified: Secondary | ICD-10-CM | POA: Diagnosis present

## 2022-05-26 DIAGNOSIS — I4819 Other persistent atrial fibrillation: Secondary | ICD-10-CM | POA: Diagnosis not present

## 2022-05-26 DIAGNOSIS — I5033 Acute on chronic diastolic (congestive) heart failure: Secondary | ICD-10-CM | POA: Diagnosis not present

## 2022-05-26 DIAGNOSIS — Z7982 Long term (current) use of aspirin: Secondary | ICD-10-CM

## 2022-05-26 DIAGNOSIS — D124 Benign neoplasm of descending colon: Secondary | ICD-10-CM | POA: Diagnosis present

## 2022-05-26 DIAGNOSIS — R140 Abdominal distension (gaseous): Secondary | ICD-10-CM | POA: Diagnosis not present

## 2022-05-26 DIAGNOSIS — F5104 Psychophysiologic insomnia: Secondary | ICD-10-CM | POA: Diagnosis present

## 2022-05-26 DIAGNOSIS — K5190 Ulcerative colitis, unspecified, without complications: Secondary | ICD-10-CM | POA: Diagnosis not present

## 2022-05-26 DIAGNOSIS — R197 Diarrhea, unspecified: Secondary | ICD-10-CM | POA: Diagnosis not present

## 2022-05-26 DIAGNOSIS — K922 Gastrointestinal hemorrhage, unspecified: Secondary | ICD-10-CM | POA: Diagnosis not present

## 2022-05-26 DIAGNOSIS — K921 Melena: Secondary | ICD-10-CM | POA: Diagnosis not present

## 2022-05-26 DIAGNOSIS — R1084 Generalized abdominal pain: Secondary | ICD-10-CM | POA: Diagnosis not present

## 2022-05-26 DIAGNOSIS — D123 Benign neoplasm of transverse colon: Secondary | ICD-10-CM | POA: Diagnosis present

## 2022-05-26 DIAGNOSIS — D7589 Other specified diseases of blood and blood-forming organs: Secondary | ICD-10-CM | POA: Diagnosis present

## 2022-05-26 DIAGNOSIS — W19XXXA Unspecified fall, initial encounter: Secondary | ICD-10-CM | POA: Diagnosis present

## 2022-05-26 DIAGNOSIS — Z800 Family history of malignant neoplasm of digestive organs: Secondary | ICD-10-CM

## 2022-05-26 DIAGNOSIS — R001 Bradycardia, unspecified: Secondary | ICD-10-CM | POA: Diagnosis not present

## 2022-05-26 DIAGNOSIS — Z8719 Personal history of other diseases of the digestive system: Secondary | ICD-10-CM | POA: Diagnosis not present

## 2022-05-26 DIAGNOSIS — I482 Chronic atrial fibrillation, unspecified: Secondary | ICD-10-CM | POA: Diagnosis not present

## 2022-05-26 DIAGNOSIS — K5732 Diverticulitis of large intestine without perforation or abscess without bleeding: Secondary | ICD-10-CM | POA: Diagnosis not present

## 2022-05-26 DIAGNOSIS — K51911 Ulcerative colitis, unspecified with rectal bleeding: Secondary | ICD-10-CM | POA: Diagnosis not present

## 2022-05-26 DIAGNOSIS — I10 Essential (primary) hypertension: Secondary | ICD-10-CM

## 2022-05-26 DIAGNOSIS — F411 Generalized anxiety disorder: Secondary | ICD-10-CM | POA: Diagnosis present

## 2022-05-26 DIAGNOSIS — Y92009 Unspecified place in unspecified non-institutional (private) residence as the place of occurrence of the external cause: Secondary | ICD-10-CM

## 2022-05-26 DIAGNOSIS — E861 Hypovolemia: Secondary | ICD-10-CM | POA: Diagnosis not present

## 2022-05-26 DIAGNOSIS — I480 Paroxysmal atrial fibrillation: Secondary | ICD-10-CM | POA: Diagnosis not present

## 2022-05-26 DIAGNOSIS — Z79899 Other long term (current) drug therapy: Secondary | ICD-10-CM

## 2022-05-26 DIAGNOSIS — K55049 Acute infarction of large intestine, extent unspecified: Secondary | ICD-10-CM | POA: Diagnosis not present

## 2022-05-26 DIAGNOSIS — K449 Diaphragmatic hernia without obstruction or gangrene: Secondary | ICD-10-CM | POA: Diagnosis not present

## 2022-05-26 DIAGNOSIS — R531 Weakness: Secondary | ICD-10-CM | POA: Diagnosis not present

## 2022-05-26 DIAGNOSIS — Z79890 Hormone replacement therapy (postmenopausal): Secondary | ICD-10-CM

## 2022-05-26 DIAGNOSIS — K625 Hemorrhage of anus and rectum: Secondary | ICD-10-CM | POA: Diagnosis not present

## 2022-05-26 DIAGNOSIS — K559 Vascular disorder of intestine, unspecified: Secondary | ICD-10-CM | POA: Diagnosis not present

## 2022-05-26 DIAGNOSIS — K219 Gastro-esophageal reflux disease without esophagitis: Secondary | ICD-10-CM | POA: Diagnosis not present

## 2022-05-26 DIAGNOSIS — D509 Iron deficiency anemia, unspecified: Secondary | ICD-10-CM | POA: Diagnosis not present

## 2022-05-26 DIAGNOSIS — R109 Unspecified abdominal pain: Secondary | ICD-10-CM | POA: Diagnosis not present

## 2022-05-26 DIAGNOSIS — D649 Anemia, unspecified: Secondary | ICD-10-CM

## 2022-05-26 DIAGNOSIS — F32A Depression, unspecified: Secondary | ICD-10-CM | POA: Diagnosis present

## 2022-05-26 DIAGNOSIS — E876 Hypokalemia: Secondary | ICD-10-CM | POA: Diagnosis not present

## 2022-05-26 DIAGNOSIS — K222 Esophageal obstruction: Secondary | ICD-10-CM | POA: Diagnosis not present

## 2022-05-26 DIAGNOSIS — F329 Major depressive disorder, single episode, unspecified: Secondary | ICD-10-CM | POA: Diagnosis present

## 2022-05-26 DIAGNOSIS — Z7901 Long term (current) use of anticoagulants: Secondary | ICD-10-CM

## 2022-05-26 DIAGNOSIS — K5731 Diverticulosis of large intestine without perforation or abscess with bleeding: Secondary | ICD-10-CM | POA: Diagnosis not present

## 2022-05-26 DIAGNOSIS — E869 Volume depletion, unspecified: Secondary | ICD-10-CM | POA: Diagnosis present

## 2022-05-26 DIAGNOSIS — Z8673 Personal history of transient ischemic attack (TIA), and cerebral infarction without residual deficits: Secondary | ICD-10-CM

## 2022-05-26 DIAGNOSIS — K635 Polyp of colon: Secondary | ICD-10-CM | POA: Diagnosis not present

## 2022-05-26 DIAGNOSIS — N179 Acute kidney failure, unspecified: Secondary | ICD-10-CM | POA: Diagnosis not present

## 2022-05-26 DIAGNOSIS — I4891 Unspecified atrial fibrillation: Secondary | ICD-10-CM | POA: Diagnosis not present

## 2022-05-26 DIAGNOSIS — D490 Neoplasm of unspecified behavior of digestive system: Secondary | ICD-10-CM | POA: Diagnosis not present

## 2022-05-26 DIAGNOSIS — I5032 Chronic diastolic (congestive) heart failure: Secondary | ICD-10-CM

## 2022-05-26 DIAGNOSIS — Z8249 Family history of ischemic heart disease and other diseases of the circulatory system: Secondary | ICD-10-CM

## 2022-05-26 DIAGNOSIS — J9811 Atelectasis: Secondary | ICD-10-CM | POA: Diagnosis not present

## 2022-05-26 DIAGNOSIS — Z8616 Personal history of COVID-19: Secondary | ICD-10-CM

## 2022-05-26 DIAGNOSIS — I110 Hypertensive heart disease with heart failure: Secondary | ICD-10-CM | POA: Diagnosis not present

## 2022-05-26 DIAGNOSIS — E039 Hypothyroidism, unspecified: Secondary | ICD-10-CM | POA: Diagnosis not present

## 2022-05-26 DIAGNOSIS — K5730 Diverticulosis of large intestine without perforation or abscess without bleeding: Secondary | ICD-10-CM | POA: Diagnosis not present

## 2022-05-26 DIAGNOSIS — J90 Pleural effusion, not elsewhere classified: Secondary | ICD-10-CM | POA: Diagnosis not present

## 2022-05-26 DIAGNOSIS — K633 Ulcer of intestine: Secondary | ICD-10-CM | POA: Diagnosis not present

## 2022-05-26 DIAGNOSIS — R0609 Other forms of dyspnea: Secondary | ICD-10-CM | POA: Diagnosis not present

## 2022-05-26 DIAGNOSIS — I700 Atherosclerosis of aorta: Secondary | ICD-10-CM | POA: Diagnosis not present

## 2022-05-26 DIAGNOSIS — K5669 Other partial intestinal obstruction: Secondary | ICD-10-CM | POA: Diagnosis not present

## 2022-05-26 DIAGNOSIS — C189 Malignant neoplasm of colon, unspecified: Secondary | ICD-10-CM | POA: Diagnosis not present

## 2022-05-26 DIAGNOSIS — R519 Headache, unspecified: Secondary | ICD-10-CM | POA: Diagnosis not present

## 2022-05-26 DIAGNOSIS — Z6841 Body Mass Index (BMI) 40.0 and over, adult: Secondary | ICD-10-CM | POA: Diagnosis not present

## 2022-05-26 DIAGNOSIS — K5792 Diverticulitis of intestine, part unspecified, without perforation or abscess without bleeding: Secondary | ICD-10-CM | POA: Diagnosis not present

## 2022-05-26 DIAGNOSIS — K6389 Other specified diseases of intestine: Secondary | ICD-10-CM | POA: Diagnosis not present

## 2022-05-26 DIAGNOSIS — J9601 Acute respiratory failure with hypoxia: Secondary | ICD-10-CM | POA: Diagnosis not present

## 2022-05-26 DIAGNOSIS — R339 Retention of urine, unspecified: Secondary | ICD-10-CM | POA: Diagnosis not present

## 2022-05-26 DIAGNOSIS — R933 Abnormal findings on diagnostic imaging of other parts of digestive tract: Secondary | ICD-10-CM | POA: Diagnosis not present

## 2022-05-26 DIAGNOSIS — K529 Noninfective gastroenteritis and colitis, unspecified: Secondary | ICD-10-CM | POA: Diagnosis not present

## 2022-05-26 LAB — HEPATIC FUNCTION PANEL
ALT: 28 U/L (ref 0–44)
AST: 40 U/L (ref 15–41)
Albumin: 2.8 g/dL — ABNORMAL LOW (ref 3.5–5.0)
Alkaline Phosphatase: 71 U/L (ref 38–126)
Bilirubin, Direct: <0.1 mg/dL (ref 0.0–0.2)
Total Bilirubin: 0.5 mg/dL (ref 0.3–1.2)
Total Protein: 5.5 g/dL — ABNORMAL LOW (ref 6.5–8.1)

## 2022-05-26 LAB — COMPLETE METABOLIC PANEL WITH GFR
AG Ratio: 1.7 (calc) (ref 1.0–2.5)
ALT: 15 U/L (ref 6–29)
AST: 21 U/L (ref 10–35)
Albumin: 3 g/dL — ABNORMAL LOW (ref 3.6–5.1)
Alkaline phosphatase (APISO): 66 U/L (ref 37–153)
BUN: 9 mg/dL (ref 7–25)
CO2: 33 mmol/L — ABNORMAL HIGH (ref 20–32)
Calcium: 8.4 mg/dL — ABNORMAL LOW (ref 8.6–10.4)
Chloride: 103 mmol/L (ref 98–110)
Creat: 0.85 mg/dL (ref 0.60–1.00)
Globulin: 1.8 g/dL (calc) — ABNORMAL LOW (ref 1.9–3.7)
Glucose, Bld: 103 mg/dL — ABNORMAL HIGH (ref 65–99)
Potassium: 3.6 mmol/L (ref 3.5–5.3)
Sodium: 142 mmol/L (ref 135–146)
Total Bilirubin: 0.6 mg/dL (ref 0.2–1.2)
Total Protein: 4.8 g/dL — ABNORMAL LOW (ref 6.1–8.1)
eGFR: 73 mL/min/{1.73_m2} (ref >=60)

## 2022-05-26 LAB — ABO/RH: ABO/RH(D): A NEG

## 2022-05-26 LAB — CBC WITH DIFFERENTIAL/PLATELET
Abs Immature Granulocytes: 0.02 10*3/uL (ref 0.00–0.07)
Absolute Monocytes: 463 cells/uL (ref 200–950)
Basophils Absolute: 0 10*3/uL (ref 0.0–0.1)
Basophils Absolute: 42 cells/uL (ref 0–200)
Basophils Relative: 0.8 %
Basophils Relative: 1 %
Eosinophils Absolute: 0.1 10*3/uL (ref 0.0–0.5)
Eosinophils Absolute: 52 cells/uL (ref 15–500)
Eosinophils Relative: 1 %
Eosinophils Relative: 1 %
HCT: 18.6 % — ABNORMAL LOW (ref 35.0–45.0)
HCT: 21.2 % — ABNORMAL LOW (ref 36.0–46.0)
Hemoglobin: 6 g/dL — CL (ref 11.7–15.5)
Hemoglobin: 6.6 g/dL — CL (ref 12.0–15.0)
Immature Granulocytes: 0 %
Lymphocytes Relative: 23 %
Lymphs Abs: 1.2 10*3/uL (ref 0.7–4.0)
Lymphs Abs: 1154 cells/uL (ref 850–3900)
MCH: 32.4 pg (ref 27.0–33.0)
MCH: 32.7 pg (ref 26.0–34.0)
MCHC: 31.1 g/dL (ref 30.0–36.0)
MCHC: 32.3 g/dL (ref 32.0–36.0)
MCV: 100.5 fL — ABNORMAL HIGH (ref 80.0–100.0)
MCV: 105 fL — ABNORMAL HIGH (ref 80.0–100.0)
MPV: 9.3 fL (ref 7.5–12.5)
Monocytes Absolute: 0.4 10*3/uL (ref 0.1–1.0)
Monocytes Relative: 8 %
Monocytes Relative: 8.9 %
Neutro Abs: 3.6 10*3/uL (ref 1.7–7.7)
Neutro Abs: 3489 cells/uL (ref 1500–7800)
Neutrophils Relative %: 67 %
Neutrophils Relative %: 67.1 %
Platelets: 217 10*3/uL (ref 140–400)
Platelets: 244 10*3/uL (ref 150–400)
RBC: 1.85 10*6/uL — ABNORMAL LOW (ref 3.80–5.10)
RBC: 2.02 MIL/uL — ABNORMAL LOW (ref 3.87–5.11)
RDW: 13.7 % (ref 11.0–15.0)
RDW: 16.1 % — ABNORMAL HIGH (ref 11.5–15.5)
Total Lymphocyte: 22.2 %
WBC: 5.2 10*3/uL (ref 3.8–10.8)
WBC: 5.3 10*3/uL (ref 4.0–10.5)
nRBC: 0 % (ref 0.0–0.2)

## 2022-05-26 LAB — HEMOGLOBIN AND HEMATOCRIT, BLOOD
HCT: 26.8 % — ABNORMAL LOW (ref 36.0–46.0)
Hemoglobin: 8.6 g/dL — ABNORMAL LOW (ref 12.0–15.0)

## 2022-05-26 LAB — BASIC METABOLIC PANEL
Anion gap: 8 (ref 5–15)
BUN: 9 mg/dL (ref 8–23)
CO2: 30 mmol/L (ref 22–32)
Calcium: 8.7 mg/dL — ABNORMAL LOW (ref 8.9–10.3)
Chloride: 101 mmol/L (ref 98–111)
Creatinine, Ser: 1.18 mg/dL — ABNORMAL HIGH (ref 0.44–1.00)
GFR, Estimated: 49 mL/min — ABNORMAL LOW (ref >60)
Glucose, Bld: 136 mg/dL — ABNORMAL HIGH (ref 70–99)
Potassium: 3.1 mmol/L — ABNORMAL LOW (ref 3.5–5.1)
Sodium: 139 mmol/L (ref 135–145)

## 2022-05-26 LAB — PROTIME-INR
INR: 1.1 (ref 0.8–1.2)
Prothrombin Time: 13.9 seconds (ref 11.4–15.2)

## 2022-05-26 LAB — LIPASE: Lipase: 9 U/L (ref 7–60)

## 2022-05-26 LAB — AMYLASE: Amylase: 19 U/L — ABNORMAL LOW (ref 21–101)

## 2022-05-26 LAB — PREPARE RBC (CROSSMATCH)

## 2022-05-26 MED ORDER — OXYCODONE-ACETAMINOPHEN 5-325 MG PO TABS
1.0000 | ORAL_TABLET | Freq: Three times a day (TID) | ORAL | Status: DC | PRN
  Administered 2022-05-26 – 2022-06-06 (×15): 1 via ORAL
  Filled 2022-05-26 (×16): qty 1

## 2022-05-26 MED ORDER — SODIUM CHLORIDE 0.9 % IV SOLN: 10.0000 mL/h | Freq: Once | INTRAVENOUS | Status: DC

## 2022-05-26 MED ORDER — ONDANSETRON HCL 4 MG/2ML IJ SOLN
4.0000 mg | Freq: Four times a day (QID) | INTRAMUSCULAR | Status: DC | PRN
  Administered 2022-06-01 – 2022-06-08 (×3): 4 mg via INTRAVENOUS
  Filled 2022-05-26 (×3): qty 2

## 2022-05-26 MED ORDER — FLECAINIDE ACETATE 50 MG PO TABS
50.0000 mg | ORAL_TABLET | Freq: Two times a day (BID) | ORAL | Status: DC
  Administered 2022-05-26 – 2022-06-13 (×35): 50 mg via ORAL
  Filled 2022-05-26 (×39): qty 1

## 2022-05-26 MED ORDER — ACETAMINOPHEN 325 MG PO TABS
650.0000 mg | ORAL_TABLET | Freq: Four times a day (QID) | ORAL | Status: DC | PRN
  Administered 2022-05-26 – 2022-06-06 (×19): 650 mg via ORAL
  Filled 2022-05-26 (×19): qty 2

## 2022-05-26 MED ORDER — RAMELTEON 8 MG PO TABS
8.0000 mg | ORAL_TABLET | Freq: Every day | ORAL | Status: DC
  Administered 2022-05-26 – 2022-06-12 (×16): 8 mg via ORAL
  Filled 2022-05-26 (×19): qty 1

## 2022-05-26 MED ORDER — ACETAMINOPHEN 650 MG RE SUPP: 650.0000 mg | Freq: Four times a day (QID) | RECTAL | Status: DC | PRN

## 2022-05-26 MED ORDER — IOHEXOL 350 MG/ML SOLN
100.0000 mL | Freq: Once | INTRAVENOUS | Status: AC | PRN
  Administered 2022-05-26: 100 mL via INTRAVENOUS

## 2022-05-26 MED ORDER — PANTOPRAZOLE SODIUM 40 MG PO TBEC
40.0000 mg | DELAYED_RELEASE_TABLET | Freq: Every day | ORAL | Status: DC
  Administered 2022-05-26 – 2022-06-13 (×19): 40 mg via ORAL
  Filled 2022-05-26 (×20): qty 1

## 2022-05-26 MED ORDER — LEVOTHYROXINE SODIUM 75 MCG PO TABS
75.0000 ug | ORAL_TABLET | Freq: Every day | ORAL | Status: DC
  Administered 2022-05-26 – 2022-06-13 (×18): 75 ug via ORAL
  Filled 2022-05-26 (×19): qty 1

## 2022-05-26 MED ORDER — POTASSIUM CHLORIDE CRYS ER 20 MEQ PO TBCR: 40.0000 meq | EXTENDED_RELEASE_TABLET | Freq: Once | ORAL | Status: DC

## 2022-05-26 MED ORDER — VENLAFAXINE HCL ER 75 MG PO CP24
225.0000 mg | ORAL_CAPSULE | Freq: Every day | ORAL | Status: DC
  Administered 2022-05-27 – 2022-06-13 (×18): 225 mg via ORAL
  Filled 2022-05-26 (×2): qty 3
  Filled 2022-05-26: qty 1
  Filled 2022-05-26 (×2): qty 3
  Filled 2022-05-26: qty 1
  Filled 2022-05-26 (×3): qty 3
  Filled 2022-05-26 (×2): qty 1
  Filled 2022-05-26: qty 3
  Filled 2022-05-26 (×3): qty 1
  Filled 2022-05-26: qty 3
  Filled 2022-05-26: qty 1
  Filled 2022-05-26 (×3): qty 3

## 2022-05-26 MED ORDER — TRAZODONE HCL 100 MG PO TABS
100.0000 mg | ORAL_TABLET | Freq: Every day | ORAL | Status: DC
  Administered 2022-05-26 – 2022-06-07 (×11): 100 mg via ORAL
  Filled 2022-05-26 (×15): qty 1

## 2022-05-26 MED ORDER — GABAPENTIN 600 MG PO TABS
1200.0000 mg | ORAL_TABLET | Freq: Every day | ORAL | Status: DC
  Administered 2022-05-26 – 2022-06-07 (×13): 1200 mg via ORAL
  Administered 2022-06-10: 600 mg via ORAL
  Administered 2022-06-11 – 2022-06-12 (×2): 1200 mg via ORAL
  Filled 2022-05-26 (×17): qty 2

## 2022-05-26 MED ORDER — ONDANSETRON HCL 4 MG PO TABS: 4.0000 mg | ORAL_TABLET | Freq: Four times a day (QID) | ORAL | Status: DC | PRN

## 2022-05-26 MED ORDER — ALBUTEROL SULFATE (2.5 MG/3ML) 0.083% IN NEBU
2.5000 mg | INHALATION_SOLUTION | Freq: Four times a day (QID) | RESPIRATORY_TRACT | Status: DC | PRN
Start: 2022-05-26 — End: 2022-06-14

## 2022-05-26 MED ORDER — SODIUM CHLORIDE 0.9% FLUSH
3.0000 mL | Freq: Two times a day (BID) | INTRAVENOUS | Status: DC
  Administered 2022-05-26 – 2022-06-13 (×25): 3 mL via INTRAVENOUS

## 2022-05-26 NOTE — ED Provider Notes (Signed)
[facility] EMERGENCY DEPARTMENT Provider Note   CSN: [id number] Arrival date & time: 05/26/22  0450     History  Chief Complaint  Patient presents with   Abnormal Lab    [name] is a 71 y.o. female.  The history is provided by the patient and a relative.  Abnormal Lab She has history of hypertension, hyperlipidemia, chronic atrial fibrillation anticoagulated on apixaban and comes in because of low hemoglobin.  She had been admitted to [facility] in [place] for colitis, discharged 10 days ago.  She started having bright red rectal bleeding 3-4 days ago.  She contacted her primary care provider who told her to stop taking apixaban.  She estimates 7 or 8 bowel movements with bright red blood.  Since stopping apixaban, she has not had any more bleeding, but she has noted that she feels generally weak and notices shortness of breath with relatively modest exertion.  She denies any abdominal pain, nausea, vomiting.  She saw her primary care provider yesterday who ordered blood work and hemoglobin came back at 6.0.   Home Medications Prior to Admission medications   Medication Sig Start Date End Date Taking? Authorizing Provider  AMBULATORY NON FORMULARY MEDICATION Medication Name: Rollator 06/08/21   Silverio Decamp, MD  AMBULATORY NON Tool scooter for use daily 11/12/21   Silverio Decamp, MD  AMBULATORY NON FORMULARY MEDICATION Shower seat for use daily 05/16/22   Silverio Decamp, MD  aspirin EC 81 MG tablet Take 1 tablet (81 mg total) by mouth daily. 05/25/22   Silverio Decamp, MD  flecainide (TAMBOCOR) 50 MG tablet Take 50 mg by mouth 2 (two) times a day. 03/20/19   [provider]  furosemide (LASIX) 20 MG tablet Take 0.5 tablets (10 mg total) by mouth daily. 07/23/21   Silverio Decamp, MD  gabapentin (NEURONTIN) 600 MG tablet Take 2 tablets (1,200 mg total) by mouth at bedtime. 01/24/22    Silverio Decamp, MD  icosapent Ethyl (VASCEPA) 1 g capsule Take 2 capsules (2 g total) by mouth 2 (two) times daily. 03/09/21   Silverio Decamp, MD  levothyroxine (SYNTHROID) 75 MCG tablet Take 1 tablet (75 mcg total) by mouth daily with breakfast. 01/24/22   Silverio Decamp, MD  metoprolol succinate (TOPROL-XL) 50 MG 24 hr tablet TAKE ONE TABLET BY MOUTH DAILY 03/01/22   Silverio Decamp, MD  ondansetron (ZOFRAN-ODT) 8 MG disintegrating tablet Take 1 tablet (8 mg total) by mouth every 8 (eight) hours as needed for nausea. 05/25/22   Silverio Decamp, MD  oxyCODONE-acetaminophen (PERCOCET/ROXICET) 5-325 MG tablet Take 1 tablet by mouth every 8 (eight) hours as needed. 05/25/22   Silverio Decamp, MD  pantoprazole (PROTONIX) 40 MG tablet TAKE ONE TABLET BY MOUTH DAILY 04/28/22   Silverio Decamp, MD  pravastatin (PRAVACHOL) 10 MG tablet Take 1 tablet (10 mg total) by mouth daily. 11/22/21   Silverio Decamp, MD  ramelteon (ROZEREM) 8 MG tablet 1 tablet p.o. at [facility] 01/24/22   Silverio Decamp, MD  traMADol (ULTRAM) 50 MG tablet TAKE ONE TABLET BY MOUTH EVERY 8 HOURS AS NEEDED FOR MODERATE PAIN 04/06/22   Silverio Decamp, MD  traZODone (DESYREL) 100 MG tablet Take 1 tablet (100 mg total) by mouth at bedtime. 07/08/21   Silverio Decamp, MD  valsartan-hydrochlorothiazide (DIOVAN-HCT) 320-25 MG tablet One half tab daily 05/25/22   Silverio Decamp, MD  venlafaxine XR [facility])  75 MG 24 hr capsule Take 3 capsules (225 mg total) by mouth daily. 05/23/22   Silverio Decamp, MD      Allergies    Patient has no known allergies.    Review of Systems   Review of Systems  All other systems reviewed and are negative.   Physical Exam Updated Vital Signs BP (!) 133/57 (BP Location: Right Arm)   Pulse 71   Temp 98.1 F (36.7 C) (Oral)   Resp 16   SpO2 91%  Physical Exam Vitals and nursing note reviewed.     ED Results /  Procedures / Treatments   Labs (all labs ordered are listed, but only abnormal results are displayed) Labs Reviewed  CBC WITH DIFFERENTIAL/PLATELET - Abnormal; Notable for the following components:      Result Value   RBC 2.02 (*)    Hemoglobin 6.6 (*)    HCT 21.2 (*)    MCV 105.0 (*)    RDW 16.1 (*)    All other components within normal limits  BASIC METABOLIC PANEL - Abnormal; Notable for the following components:   Potassium 3.1 (*)    Glucose, Bld 136 (*)    Creatinine, Ser 1.18 (*)    Calcium 8.7 (*)    GFR, Estimated 49 (*)    All other components within normal limits  PROTIME-INR  HEPATIC FUNCTION PANEL  POC OCCULT BLOOD, ED  TYPE AND SCREEN  PREPARE RBC (CROSSMATCH)    EKG EKG Interpretation  Date/Time:  Thursday May 26 2022 05:06:12 EDT Ventricular Rate:  68 PR Interval:  194 QRS Duration: 76 QT Interval:  280 QTC Calculation: 297 R Axis:   36 Text Interpretation: Sinus rhythm with Premature atrial complexes Low voltage QRS Cannot rule out Anterior infarct , age undetermined Abnormal ECG When compared with ECG of 23-Apr-2007 14:06, No significant change was found Confirmed by Delora Fuel ([id number]) on 05/26/2022 5:57:04 AM  Procedures Procedures    Medications Ordered in ED Medications  0.9 %  sodium chloride infusion (has no administration in time range)    ED Course/ Medical Decision Making/ A&P                           Medical Decision Making Amount and/or Complexity of Data Reviewed Labs: ordered.  Risk Prescription drug management.   Anemia due to acute blood loss from lower GI bleeding while anticoagulated on apixaban.  Differential diagnosis of causes of bleeding include diverticulosis, AV malformation, colon polyp, colon cancer.  Old records are reviewed, and she had a colonoscopy in 2010 which was completely normal.  I have reviewed and interpreted her labs today, and my interpretation is macrocytic anemia which is slightly improved compared  with yesterday (hemoglobin 6.6 compared with 6.0), hypokalemia, mildly elevated creatinine, mildly elevated random glucose.  I have independently viewed and interpreted the ECG, and my interpretation is low voltage, occasional PAC, unchanged from prior.  Old records are reviewed and it is noted she had been admitted to [facility] from 05/08/2022 through 05/13/2022 for colitis with most recent hemoglobin there 10.7 on 05/13/2022.  Apparently, colitis was felt to be infectious and she was discharged with prescription for amoxicillin-clavulanate.  I have ordered packed red blood cell transfusion.  I have also ordered some oral potassium for her hypokalemia.  Case is discussed with Dr. Nevada Crane of [facility], who agrees to admit the patient.  I have sent a secure chat  to Dr. Henrene Pastor, on phone call for gastroenterology, to see the patient in consultation.  She is currently hemodynamically stable with no evidence of ongoing bleeding.  CRITICAL CARE Performed by: Delora Fuel Total critical care time: 35 minutes Critical care time was exclusive of separately billable procedures and treating other patients. Critical care was necessary to treat or prevent imminent or life-threatening deterioration. Critical care was time spent personally by me on the following activities: development of treatment plan with patient and/or surrogate as well as nursing, discussions with consultants, evaluation of patient's response to treatment, examination of patient, obtaining history from patient or surrogate, ordering and performing treatments and interventions, ordering and review of laboratory studies, ordering and review of radiographic studies, pulse oximetry and re-evaluation of patient's condition.  Final Clinical Impression(s) / ED Diagnoses Final diagnoses:  Lower gastrointestinal bleeding  Anemia associated with acute blood loss  Macrocytosis  Hypokalemia  Chronic atrial fibrillation [facility])     Rx / DC Orders ED Discharge Orders     None         Delora Fuel, MD 35/70/17 [phone]

## 2022-05-26 NOTE — H&P (Addendum)
History and Physical    Patient: [name] DOB: [birth year] DOA: 05/26/2022 DOS: the patient was seen and examined on 05/26/2022 PCP: Silverio Decamp, MD  Patient coming from: Home  Chief Complaint:  Chief Complaint  Patient presents with   Abnormal Lab   HPI: [name] is a 71 y.o. female with medical history significant of hypertension, hyperlipidemia, atrial fibrillation on anticoagulation s/p ablation, hypothyroidism, GERD, anxiety, and depression who presents due to low hemoglobin.  She had just recently been admitted at [facility] in [place] from 7/9-7/17 for nausea, vomiting, diarrhea after having a fall.  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  Treated initially with IV antibiotics switched over to Augmentin prior to being discharged home.  She had completed the 5-day course of antibiotics as prescribed.  During the hospitalization patient's initial hemoglobin was 12.9, but dropped as low as 8.5 on 7/11.  Stool guaiacs were noted to be positive during that hospitalization, but hemoglobin was noted to be 10.7 on 7/14.   Since getting out of the hospital she had about 3-4 days of rectal bleeding.  Estimates have 6-8 cranberry juice colored stool.  She had a televisit with her primary care provider on 7/24 due to the bleeding and reports of elevated blood pressures into the 200s.  During that visit she was advised to stop Eliquis, and since then she has had no more episodes of bleeding.  She had began feel weak, reported intermittent wheezing, and short of breath with exertion.   In addition to this she has had lower extremities swelling and reports that she has gained approximately 18 pounds she had gone into the hospital.  She is also still having tenderness to the right side of her abdomen and reports having 2-3 bowel movements per day on average.  Patient reports that she had been sent in another  prescription from what she believes was the hospital for continuation of Augmentin in the couple days ago.  She had a follow-up appointment yesterday with her primary care provider where blood work was obtained.  She was called later that evening and told that her hemoglobin was down to 6 anterior go to the hospital for further evaluation.   Review of records note patient last had a colonoscopy about [facility] in 2010 that showed a normal colon.  She had been recommended to have repeat colonoscopy in 5 years given her family history of colon cancer.  She reports that she had scheduled have a repeat colonoscopy in either 2021 or 2022, but came down with COVID for which she was unable to have the procedure and had yet to reschedule.  She reports that her father died at the age of 66 from colon cancer.  Upon admission into the emergency department patient was noted to have blood pressures as low as 96/38 on arrival which improved up to 137/53, and all other vital signs were within normal limits.  Labs significant for hemoglobin 6.6 with MCV 105, potassium 3.1, BUN 9, creatinine 1.18. Patient had been typed and screened for blood products and ordered 2 units of packed red blood cells.  Review of Systems: As mentioned in the history of present illness. All other systems reviewed and are negative. Past Medical History:  Diagnosis Date   Anxiety    Atrial fibrillation (HCC)    Depression    GERD (gastroesophageal reflux disease)    Hypertension    Plaque psoriasis  Thyroid disease    Past Surgical History:  Procedure Laterality Date   ORIF ANKLE FRACTURE     Social History:  reports that she has never smoked. She has never used smokeless tobacco. She reports that she does not drink alcohol and does not use drugs.  No Known Allergies  Family History  Problem Relation Age of Onset   Hypertension Mother    Hypertension Father    Cancer Father        colon   Cancer Daughter        daughter    Cancer Son        skin   Cancer Maternal Aunt        breast   Hypertension Maternal Grandmother    Hypertension Maternal Grandfather    Heart attack Maternal Grandfather    Hypertension Paternal Grandmother    Stroke Paternal Grandfather    Cancer Maternal Aunt        breast    Prior to Admission medications   Medication Sig Start Date End Date Taking? Authorizing Provider  AMBULATORY NON FORMULARY MEDICATION Medication Name: Rollator 06/08/21   Silverio Decamp, MD  AMBULATORY NON [place] scooter for use daily 11/12/21   Silverio Decamp, MD  AMBULATORY NON FORMULARY MEDICATION Shower seat for use daily 05/16/22   Silverio Decamp, MD  aspirin EC 81 MG tablet Take 1 tablet (81 mg total) by mouth daily. 05/25/22   Silverio Decamp, MD  flecainide (TAMBOCOR) 50 MG tablet Take 50 mg by mouth 2 (two) times a day. 03/20/19   [provider]  furosemide (LASIX) 20 MG tablet Take 0.5 tablets (10 mg total) by mouth daily. 07/23/21   Silverio Decamp, MD  gabapentin (NEURONTIN) 600 MG tablet Take 2 tablets (1,200 mg total) by mouth at bedtime. 01/24/22   Silverio Decamp, MD  icosapent Ethyl (VASCEPA) 1 g capsule Take 2 capsules (2 g total) by mouth 2 (two) times daily. 03/09/21   Silverio Decamp, MD  levothyroxine (SYNTHROID) 75 MCG tablet Take 1 tablet (75 mcg total) by mouth daily with breakfast. 01/24/22   Silverio Decamp, MD  metoprolol succinate (TOPROL-XL) 50 MG 24 hr tablet TAKE ONE TABLET BY MOUTH DAILY 03/01/22   Silverio Decamp, MD  ondansetron (ZOFRAN-ODT) 8 MG disintegrating tablet Take 1 tablet (8 mg total) by mouth every 8 (eight) hours as needed for nausea. 05/25/22   Silverio Decamp, MD  oxyCODONE-acetaminophen (PERCOCET/ROXICET) 5-325 MG tablet Take 1 tablet by mouth every 8 (eight) hours as needed. 05/25/22   Silverio Decamp, MD  pantoprazole (PROTONIX) 40 MG tablet TAKE ONE TABLET BY MOUTH  DAILY 04/28/22   Silverio Decamp, MD  pravastatin (PRAVACHOL) 10 MG tablet Take 1 tablet (10 mg total) by mouth daily. 11/22/21   Silverio Decamp, MD  ramelteon (ROZEREM) 8 MG tablet 1 tablet p.o. at [facility] 01/24/22   Silverio Decamp, MD  traMADol (ULTRAM) 50 MG tablet TAKE ONE TABLET BY MOUTH EVERY 8 HOURS AS NEEDED FOR MODERATE PAIN 04/06/22   Silverio Decamp, MD  traZODone (DESYREL) 100 MG tablet Take 1 tablet (100 mg total) by mouth at bedtime. 07/08/21   Silverio Decamp, MD  valsartan-hydrochlorothiazide (DIOVAN-HCT) 320-25 MG tablet One half tab daily 05/25/22   Silverio Decamp, MD  venlafaxine XR (EFFEXOR-XR) 75 MG 24 hr capsule Take 3 capsules (225 mg total) by mouth daily. 05/23/22   Silverio Decamp, MD  Physical Exam: Vitals:   05/26/22 0459 05/26/22 0651 05/26/22 0654  BP: (!) 133/57  (!) 137/53  Pulse: 71 61   Resp: 16 18   Temp: 98.1 F (36.7 C) 98.5 F (36.9 C)   TempSrc: Oral Oral   SpO2: 91% 92%    Exam  Constitutional: Elderly female currently in no acute distress  Eyes: PERRL, lids and conjunctivae normal ENMT: Mucous membranes are moist.   Neck: normal, supple  Respiratory: clear to auscultation bilaterally, no wheezing, no crackles. Normal respiratory effort.  Able to talk in complete sentences on room air Cardiovascular: Regular rate and rhythm, no murmurs / rubs / gallops.  Lower extremity edema present.  Abdomen: Right-sided abdominal tenderness appreciated.  Bowel sounds present..  Musculoskeletal: no clubbing / cyanosis. No joint deformity upper and lower extremities.   Skin: Pallor present. Neurologic: CN 2-12 grossly intact. Strength 5/5 in all 4.  Psychiatric: Normal judgment and insight. Alert and oriented x 3. Normal mood.   Data Reviewed:  EKG reveals sinus rhythm at 68 bpm with premature atrial complexes similar to previous EKG.  Assessment and Plan: Symptomatic anemia secondary to GI bleed Acute.   Patient had reported having rectal bleeding following recent hospitalization for colitis where he was noted guaiac positive stools.  Hemoglobin yesterday at his primary care office was 6, but was noted to be 10.7 on 7/14.  Repeat hemoglobin here 6.6.  She had been typed and screened and ordered 2 units of packed red blood cells.  She had couple days of rectal bleeding that stopped after Eliquis was held on 7/24. -Admit to a telemetry bed -Monitor intake and output -Clear liquid diet -Continue with transfusion of 2 units of packed red blood cells -Recheck H&H posttransfusion -[facility] consulted, we will follow-up for further recommendation  Acute kidney injury On admission creatinine 1.18 with BUN 9.  Baseline creatinine previously noted to be 0.85 on 7/26.  This is greater than 0.3 increased from baseline to suggest acute kidney injury and its likely related to patient's blood loss. -Check urinalysis -Hold nephrotoxic 8 -Recheck kidney function in a.m.  History of colitis Patient had just recently been treated for colitis at [facility] 7/9-7/17.  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  Repeat CT angiogram of the abdomen pelvis noted unchanged appearance of the right colon with pneumatosis for which appears gave concern for some degree of necrosis either from mesenteric ischemia or other etiology.  She had been treated with IV antibiotics and transition to Augmentin at discharge. Patient was restarted on antibiotics recently she says from the hospital. -Oxycodone as needed for pain  Diarrhea Patient has had 2-3 bowel movements had stopped per day on average. -Check C. difficile panel given recent antibiotics and reports of continued abdominal pain   Paroxysmal atrial fibrillation on chronic anticoagulation Patient is currently in  sinus rhythm.  She reports that she has been in sinus rhythm to her knowledge since having an ablation couple years ago.  Eliquis has been on hold since 7/24 -Hold Eliquis -Continue flecainide  Essential hypertension  On admission blood pressures were noted to be soft.  Home blood pressure regimen includes flecainide 50 mg twice daily, metoprolol 50 mg daily, valsartan-hydrochlorothiazide 320-25 mg half tablet daily, and furosemide 5 mg daily. -Continue flecainide -Held diuretics due to AKI -Resume metoprolol and all other  medications once medically appropriate  Hypothyroidism -Continue levothyroxine  Generalized anxiety disorder -Continue with venlafaxine  GERD -Continue Protonix  Morbid obesity BMI 43.05 kg/m  DVT prophylaxis: SCDs Advance Care Planning:   Code Status: Full Code    Consults: Gastroenterology  Family Communication: Son updated at bedside  Severity of Illness: The appropriate patient status for this patient is INPATIENT. Inpatient status is judged to be reasonable and necessary in order to provide the required intensity of service to ensure the patient's safety. The patient's presenting symptoms, physical exam findings, and initial radiographic and laboratory data in the context of their chronic comorbidities is felt to place them at high risk for further clinical deterioration. Furthermore, it is not anticipated that the patient will be medically stable for discharge from the hospital within 2 midnights of admission.   * I certify that at the point of admission it is my clinical judgment that the patient will require inpatient hospital care spanning beyond 2 midnights from the point of admission due to high intensity of service, high risk for further deterioration and high frequency of surveillance required.*  Author: Norval Morton, MD 05/26/2022 7:46 AM  For on call review [email or web address].

## 2022-05-26 NOTE — Progress Notes (Signed)
Received call after hours from nursing service regarding critical lab. Hemoglobin of 6.0. Contacted patient. She reported she is having some dizziness and having recurrent bowel movements with some blood. Note from Dr. [name] on 05/25/22 reviewed. Prior labs reviewed and hemoglobin seem to be trending up at previous hospitalization so this is a significant drop for her. Her Eliquis is currently being held. I recommend that she go t o the emergency room for further evaluation and likely transfusion.  She reports that she will contact her son to take her to the emergency room.

## 2022-05-26 NOTE — ED Provider Triage Note (Signed)
Emergency Medicine Provider Triage Evaluation Note  [name] , a 71 y.o. female  was evaluated in triage.  Pt complains of being called by her PCP to come to the emergency department this morning after her hemoglobin was 6.  Has been having strawberry jam consistency and color bowel movements x8 episodes stopped 24 hours ago.  PCP removed her from her Eliquis.  Checked her hemoglobin today in the outpatient setting and found it to be low, 6 and return to the ED.  No history of GI bleed or blood transfusion in the past.  Review of Systems  Positive: Bloody stool, no melena Negative: Chest pain shortness of breath, abdominal pain  Physical Exam  BP (!) 133/57 (BP Location: Right Arm)   Pulse 71   Temp 98.1 F (36.7 C) (Oral)   Resp 16   SpO2 91%  Gen:   Awake, no distress   Resp:  Normal effort  MSK:   Moves extremities without difficulty  Other:  Pallor on exam, no abdominal tenderness to palpation, RRR no M/R/G.  Medical Decision Making  Medically screening exam initiated at 5:22 AM.  Appropriate orders placed.  [name] was informed that the remainder of the evaluation will be completed by another provider, this initial triage assessment does not replace that evaluation, and the importance of remaining in the ED until their evaluation is complete.  Symptomatic anemia work-up with type and screen ordered.   This chart was dictated using voice recognition software, Dragon. Despite the best efforts of this provider to proofread and correct errors, errors may still occur which can change documentation meaning.    Emeline Darling, PA-C 05/26/22 [phone]

## 2022-05-26 NOTE — Consult Note (Addendum)
Referring Provider: Dr. Tamala Julian, [facility] Primary Care Physician:  Silverio Decamp, MD Primary Gastroenterologist:  Dr. Deatra Ina previously, but most recently was seen by Dr. Delle Reining through [facility]  Reason for Consultation:  Anemia/GI bleeding and abnormal CT scan  HPI: [name] is a 71 y.o. female with medical history significant of hypertension, hyperlipidemia, atrial fibrillation on anticoagulation with Eliquis s/p ablation, hypothyroidism, GERD, anxiety, and depression who presents due to low hemoglobin.  She had just recently been admitted at [facility] in [place] from 7/9-7/17 for nausea, vomiting, diarrhea, and right sided abdominal pain.  Coincidentally this started at the same time/just after having a fall and hitting her right side.  CT scan 7/9 revealed right-sided colitis with pneumatosis coli in the proximal hepatic flexure region.  Treated initially with IV antibiotics switched over to Augmentin prior to being discharged home.  She had completed the 5-day course of antibiotics as prescribed.  During the hospitalization the patient's initial hemoglobin was 12.9 grams, but dropped as low as 8.5 grams on 7/11.  Stool guaiacs were noted to be positive during that hospitalization, so CTA was performed on 7/13 and showed "unchanged abnormal appearance of the right colon with pneumatosis. This is of uncertain etiology however appearance is concerning for some degree of necrosis either from mesenteric ischemia or another etiology. Underlying mass is not definitely seen but is not excluded. No free air.  Mesenteric vasculature is patent with mild atherosclerosis."  Hemoglobin was noted to be 10.7 grams on 7/14.  On Monday, 7/24 she estimates that she had 6-8 cranberry juice colored stools.  She had a televisit with her primary care provider on 7/24 due to the bleeding and reports of elevated blood pressures into the 200s.  During that visit she was advised to  stop Eliquis, and since then she has had no more episodes of bleeding.  She had began feel weak, reported intermittent wheezing, and short of breath with exertion.  In addition to this she has had lower extremities swelling and reports that she has gained approximately 18 pounds since she had gone into the hospital.  She is also still having tenderness to the right side of her abdomen and reports having 2-3 bowel movements per day on average.  Says that she just does not feel good.  Patient reports that she had been sent in another prescription from what she believes was the hospital for continuation of Augmentin in the couple days ago and just started that yesterday.  She had a follow-up appointment yesterday with her primary care provider where blood work was obtained.  She was called in the middle of the night last night and told that her hemoglobin was down to 6 grams so she needed to go to the hospital for further evaluation.   Labs here significant for hemoglobin 6.6 grams with MCV 105, potassium 3.1, BUN 9, creatinine 1.18. Patient being given units of packed red blood cells.  CT chest abdomen and pelvis with contrast on 05/08/2022 at [facility]:  1. There are prominent findings of colitis involving the right colon. Findings include some pneumatosis coli in the proximal hepatic flexure region. Ischemic colitis not excluded. No mesenteric or portal venous gas identified.  2. Coronary and aortic calcified plaque.    CT angio of the abdomen and pelvis at [facility] 05/12/2022:  1. Unchanged abnormal appearance of the right colon with pneumatosis. This is of uncertain etiology however appearance is concerning for some degree of necrosis either from mesenteric  ischemia or another etiology. Underlying mass is not definitely seen but is not excluded. No free air.  2.  Mesenteric vasculature is patent with mild atherosclerosis.   Colonoscopy March 2010 by Dr. Deatra Ina she had a normal colon.  Repeat was recommended  in 5 years due to family history of colon cancer in her father, diagnosed in his 72s.  Was inappropriately given a Cologuard in 2022 that was positive.  She was post have a colonoscopy, but then developed COVID so then never recurred.  Past Medical History:  Diagnosis Date   Anxiety    Atrial fibrillation (HCC)    Depression    GERD (gastroesophageal reflux disease)    Hypertension    Plaque psoriasis    Thyroid disease     Past Surgical History:  Procedure Laterality Date   ORIF ANKLE FRACTURE      Prior to Admission medications   Medication Sig Start Date End Date Taking? Authorizing Provider  amoxicillin-clavulanate (AUGMENTIN) 875-125 MG tablet Take 1 tablet by mouth 2 (two) times daily. 05/20/22  Yes [provider]  aspirin EC 81 MG tablet Take 1 tablet (81 mg total) by mouth daily. 05/25/22  Yes Silverio Decamp, MD  flecainide (TAMBOCOR) 50 MG tablet Take 50 mg by mouth 2 (two) times a day. 03/20/19  Yes [provider]  furosemide (LASIX) 20 MG tablet Take 0.5 tablets (10 mg total) by mouth daily. 07/23/21  Yes Silverio Decamp, MD  gabapentin (NEURONTIN) 600 MG tablet Take 2 tablets (1,200 mg total) by mouth at bedtime. 01/24/22  Yes Silverio Decamp, MD  icosapent Ethyl (VASCEPA) 1 g capsule Take 2 capsules (2 g total) by mouth 2 (two) times daily. 03/09/21  Yes Silverio Decamp, MD  levothyroxine (SYNTHROID) 75 MCG tablet Take 1 tablet (75 mcg total) by mouth daily with breakfast. 01/24/22  Yes Silverio Decamp, MD  metoprolol succinate (TOPROL-XL) 50 MG 24 hr tablet TAKE ONE TABLET BY MOUTH DAILY Patient taking differently: Take 50 mg by mouth daily. 03/01/22  Yes Silverio Decamp, MD  Multiple Vitamins-Minerals (MULTIVITAMIN WITH MINERALS) tablet Take 1 tablet by mouth daily.   Yes [provider]  ondansetron (ZOFRAN-ODT) 8 MG disintegrating tablet Take 1 tablet (8 mg total) by mouth every 8 (eight) hours as needed  for nausea. 05/25/22  Yes Silverio Decamp, MD  oxyCODONE-acetaminophen (PERCOCET/ROXICET) 5-325 MG tablet Take 1 tablet by mouth every 8 (eight) hours as needed. 05/25/22  Yes Silverio Decamp, MD  pantoprazole (PROTONIX) 40 MG tablet TAKE ONE TABLET BY MOUTH DAILY Patient taking differently: Take 40 mg by mouth daily. 04/28/22  Yes Silverio Decamp, MD  ramelteon (ROZEREM) 8 MG tablet 1 tablet p.o. at [facility] 01/24/22  Yes Silverio Decamp, MD  traZODone (DESYREL) 100 MG tablet Take 1 tablet (100 mg total) by mouth at bedtime. 07/08/21  Yes Silverio Decamp, MD  valsartan-hydrochlorothiazide (DIOVAN-HCT) 320-25 MG tablet Take 0.5 tablets by mouth daily. 05/25/22  Yes Silverio Decamp, MD  venlafaxine XR (EFFEXOR-XR) 75 MG 24 hr capsule Take 3 capsules (225 mg total) by mouth daily. 05/23/22  Yes Silverio Decamp, MD  AMBULATORY NON FORMULARY MEDICATION Medication Name: Rollator 06/08/21   Silverio Decamp, MD  AMBULATORY NON [employer] scooter for use daily 11/12/21   Silverio Decamp, MD  AMBULATORY NON FORMULARY MEDICATION Shower seat for use daily 05/16/22   Silverio Decamp, MD    Current Facility-Administered Medications  Medication  Dose Route Frequency Provider Last Rate Last Admin   0.9 %  sodium chloride infusion  10 mL/hr Intravenous Once Norval Morton, MD       acetaminophen (TYLENOL) tablet 650 mg  650 mg Oral Q6H PRN Norval Morton, MD       Or   acetaminophen (TYLENOL) suppository 650 mg  650 mg Rectal Q6H PRN Fuller Plan A, MD       albuterol (PROVENTIL) (2.5 MG/3ML) 0.083% nebulizer solution 2.5 mg  2.5 mg Nebulization Q6H PRN Norval Morton, MD       flecainide (TAMBOCOR) tablet 50 mg  50 mg Oral Q12H Smith, Rondell A, MD       gabapentin (NEURONTIN) tablet 1,200 mg  1,200 mg Oral QHS Norval Morton, MD       [START ON 05/27/2022] levothyroxine (SYNTHROID) tablet 75 mcg  75 mcg Oral Q breakfast  Tamala Julian, Rondell A, MD       ondansetron (ZOFRAN) tablet 4 mg  4 mg Oral Q6H PRN Norval Morton, MD       Or   ondansetron (ZOFRAN) injection 4 mg  4 mg Intravenous Q6H PRN Norval Morton, MD       oxyCODONE-acetaminophen (PERCOCET/ROXICET) 5-325 MG per tablet 1 tablet  1 tablet Oral Q8H PRN Smith, Rondell A, MD       pantoprazole (PROTONIX) EC tablet 40 mg  40 mg Oral Daily Smith, Rondell A, MD       potassium chloride SA (KLOR-CON M) CR tablet 40 mEq  40 mEq Oral Once Fuller Plan A, MD       ramelteon (ROZEREM) tablet 8 mg  8 mg Oral QHS Smith, Rondell A, MD       sodium chloride flush (NS) 0.9 % injection 3 mL  3 mL Intravenous Q12H Smith, Rondell A, MD       traZODone (DESYREL) tablet 100 mg  100 mg Oral QHS Smith, Rondell A, MD       venlafaxine XR (EFFEXOR-XR) 24 hr capsule 225 mg  225 mg Oral Daily Fuller Plan A, MD       Current Outpatient Medications  Medication Sig Dispense Refill   amoxicillin-clavulanate (AUGMENTIN) 875-125 MG tablet Take 1 tablet by mouth 2 (two) times daily.     aspirin EC 81 MG tablet Take 1 tablet (81 mg total) by mouth daily. 90 tablet 3   flecainide (TAMBOCOR) 50 MG tablet Take 50 mg by mouth 2 (two) times a day.     furosemide (LASIX) 20 MG tablet Take 0.5 tablets (10 mg total) by mouth daily. 30 tablet 3   gabapentin (NEURONTIN) 600 MG tablet Take 2 tablets (1,200 mg total) by mouth at bedtime. 180 tablet 3   icosapent Ethyl (VASCEPA) 1 g capsule Take 2 capsules (2 g total) by mouth 2 (two) times daily. 360 capsule 3   levothyroxine (SYNTHROID) 75 MCG tablet Take 1 tablet (75 mcg total) by mouth daily with breakfast. 90 tablet 3   metoprolol succinate (TOPROL-XL) 50 MG 24 hr tablet TAKE ONE TABLET BY MOUTH DAILY (Patient taking differently: Take 50 mg by mouth daily.) 90 tablet 3   Multiple Vitamins-Minerals (MULTIVITAMIN WITH MINERALS) tablet Take 1 tablet by mouth daily.     ondansetron (ZOFRAN-ODT) 8 MG disintegrating tablet Take 1 tablet (8 mg  total) by mouth every 8 (eight) hours as needed for nausea. 20 tablet 3   oxyCODONE-acetaminophen (PERCOCET/ROXICET) 5-325 MG tablet Take 1 tablet by mouth every 8 (  eight) hours as needed. 90 tablet 0   pantoprazole (PROTONIX) 40 MG tablet TAKE ONE TABLET BY MOUTH DAILY (Patient taking differently: Take 40 mg by mouth daily.) 90 tablet 3   ramelteon (ROZEREM) 8 MG tablet 1 tablet p.o. at [place] 90 tablet 3   traZODone (DESYREL) 100 MG tablet Take 1 tablet (100 mg total) by mouth at bedtime. 90 tablet 3   valsartan-hydrochlorothiazide (DIOVAN-HCT) 320-25 MG tablet Take 0.5 tablets by mouth daily.     venlafaxine XR (EFFEXOR-XR) 75 MG 24 hr capsule Take 3 capsules (225 mg total) by mouth daily. 90 capsule 11   AMBULATORY NON FORMULARY MEDICATION Medication Name: Rollator 1 each 0   AMBULATORY NON FORMULARY MEDICATION Motorized scooter for use daily 1 each 0   AMBULATORY NON FORMULARY MEDICATION Shower seat for use daily 1 each 0    Allergies as of 05/26/2022   (No Known Allergies)    Family History  Problem Relation Age of Onset   Hypertension Mother    Hypertension Father    Cancer Father        colon   Cancer Daughter        daughter   Cancer Son        skin   Cancer Maternal Aunt        breast   Hypertension Maternal Grandmother    Hypertension Maternal Grandfather    Heart attack Maternal Grandfather    Hypertension Paternal Grandmother    Stroke Paternal Grandfather    Cancer Maternal Aunt        breast    Social History   Socioeconomic History   Marital status: Widowed    Spouse name: Not on file   Number of children: Not on file   Years of education: Not on file   Highest education level: Not on file  Occupational History   Not on file  Tobacco Use   Smoking status: Never   Smokeless tobacco: Never  Vaping Use   Vaping Use: Never used  Substance and Sexual Activity   Alcohol use: Never   Drug use: Never   Sexual activity: Not on file  Other Topics Concern    Not on file  Social History Narrative   Not on file   Social Determinants of Health   Financial Resource Strain: Low Risk  (07/07/2021)   Overall Financial Resource Strain (CARDIA)    Difficulty of Paying Living Expenses: Not hard at all  Food Insecurity: No Food Insecurity (07/07/2021)   Hunger Vital Sign    Worried About Running Out of Food in the Last Year: Never true    [place] in the Last Year: Never true  Transportation Needs: No Transportation Needs (07/07/2021)   PRAPARE - Hydrologist (Medical): No    Lack of Transportation (Non-Medical): No  Physical Activity: Inactive (07/07/2021)   Exercise Vital Sign    Days of Exercise per Week: 0 days    Minutes of Exercise per Session: 0 min  Stress: No Stress Concern Present (07/07/2021)   [place]    Feeling of Stress : Only a little  Social Connections: Moderately Isolated (07/07/2021)   Social Connection and Isolation Panel [NHANES]    Frequency of Communication with Friends and Family: More than three times a week    Frequency of Social Gatherings with Friends and Family: Once a week    Attends Religious Services: More than 4  times per year    Active Member of Clubs or Organizations: No    Attends Archivist Meetings: Never    Marital Status: Widowed  Intimate Partner Violence: Not At Risk (07/07/2021)   Humiliation, Afraid, Rape, and Kick questionnaire    Fear of Current or Ex-Partner: No    Emotionally Abused: No    Physically Abused: No    Sexually Abused: No    Review of Systems: ROS is O/W negative except as mentioned in HPI.  Physical Exam: Vital signs in last 24 hours: Temp:  [98.1 F (36.7 C)-98.7 F (37.1 C)] 98.6 F (37 C) (07/27 0801) Pulse Rate:  [61-71] 61 (07/27 0801) Resp:  [16-18] 18 (07/27 0801) BP: (96-137)/(38-64) 131/61 (07/27 0801) SpO2:  [82 %-97 %] 97 % (07/27 0801) Weight:  [110.2 kg]  110.2 kg (07/26 1514)   General:  Alert, Well-developed, well-nourished, pleasant and cooperative in NAD Head:  Normocephalic and atraumatic. Eyes:  Sclera clear, no icterus.  Conjunctiva pink. Ears:  Normal auditory acuity. Mouth:  No deformity or lesions.   Lungs:  Clear throughout to auscultation.  No wheezes, crackles, or rhonchi.  Heart:  Regular rate and rhythm; no murmurs, clicks, rubs, or gallops. Abdomen:  Soft, non-distended.  BS present.  Diffuse TTP> on the right. Msk:  Symmetrical without gross deformities. Pulses:  Normal pulses noted. Extremities:  Without clubbing or edema. Neurologic:  Alert and oriented x 4;  grossly normal neurologically. Skin:  Intact without significant lesions or rashes. Psych:  Alert and cooperative. Normal mood and affect.  Lab Results: Recent Labs    05/25/22 0000 05/26/22 0520  WBC 5.2 5.3  HGB 6.0* 6.6*  HCT 18.6* 21.2*  PLT 217 244   BMET Recent Labs    05/25/22 0000 05/26/22 0520  NA 142 139  K 3.6 3.1*  CL 103 101  CO2 33* 30  GLUCOSE 103* 136*  BUN 9 9  CREATININE 0.85 1.18*  CALCIUM 8.4* 8.7*   LFT Recent Labs    05/26/22 0647  PROT 5.5*  ALBUMIN 2.8*  AST 40  ALT 28  ALKPHOS 71  BILITOT 0.5  BILIDIR <0.1  IBILI NOT CALCULATED   PT/INR Recent Labs    05/26/22 0520  LABPROT 13.9  INR 1.1   IMPRESSION:  *71 year old female with complaints of right-sided abdominal pain and GI/rectal bleed.  CT scan x2 earlier this month showing right colon pneumatosis, uncertain etiology, but appearance concerning for some degree of necrosis either from mesenteric ischemia or other. Mass not definitely seen but not excluded, no free air.  Completed abx including IV abx in hospital at [facility] and then Augmentin as outpatient.  Improved but still does not feel well.  Of note:  Interestingly she fell at home and hit the right side of her abdomen, but says that the vomiting and diarrhea immediately followed the fall and she was  having nausea prior to falling and hitting her right side, which is why she was rushing to get to the bathroom. *Blood loss anemia: Hemoglobin in the 9 g range couple of weeks ago while at [facility], last was 10.7 g on 7/14 mg to 6.0/6.6 g here.  This is likely related to her GI losses.  Receiving 2 units PRBCs. *Atrial fibrillation with history of ablation, was on Eliquis but it was held on Monday, July 24th due to ongoing GI bleeding *Hypokalemia with potassium of 3.1.  Replacement per primary service. *Family history of colon cancer in her  father diagnosed and passed away in his 77s: Patient's last colonoscopy was in 2010 that was normal with repeat recommended in 5 years.  Inappropriate Cologuard in 2022 was positive, but colonoscopy never performed due to developing COVID.  PLAN: -Repeat CT angio abdomen and pelvis today. -Will need colonoscopy this hospital stay, timing TBD. -Monitor labs.  Laban Emperor. Zehr  05/26/2022, 9:25 AM    Attending physician's note   I have reviewed the chart and didn't examine the patient. I performed a substantive portion of this encounter, including complete performance of at least one of the key components, in conjunction with the APP. I agree with the Advanced Practitioner's note, impression and recommendations.  I went to her room twice and she was out of her room.  71yrold with HTN, A fib on eliquis (last dose 7/24)  [facility] abdo pain/diarrhea/GI bleed. CT/CTA showed R sided colitis with pneumatosis. No mesenteric or portal venous gas.  Underlying mass cannot be excluded.  Mesenteric vessels patent but with atherosclerosis. Rxed with antibiotics with plans of outpt colon. Eliquis stopped 7/24 d/t continued bleed.  [facility] 6[facility]6 s/p 2U. (Baseline 14, last year)  Last colon March 2010- Nl. Rpt 5 yr d/t FH CRC (dad at age 72. + cologuard 2022  Plan: -Rpt CTA -Check stool for C Diff, GI  pathogens. -Trend CBC. Keep Hb>7 -Likely colon this adm, depending upon the above and clinical course. -Dr MRush Landmarktaking over GI service tomorrow.   RCarmell Austria MD LVelora HecklerGI [phone]

## 2022-05-26 NOTE — ED Triage Notes (Signed)
Was notified HGB is 6.0 this morning.   Pt pale on arrival. Sts strawberry jam bowel movements for 6-8 episodes. Followed up with PCP and stopped taking Eliquis.

## 2022-05-27 DIAGNOSIS — R933 Abnormal findings on diagnostic imaging of other parts of digestive tract: Secondary | ICD-10-CM | POA: Diagnosis not present

## 2022-05-27 DIAGNOSIS — K921 Melena: Secondary | ICD-10-CM

## 2022-05-27 DIAGNOSIS — K5732 Diverticulitis of large intestine without perforation or abscess without bleeding: Secondary | ICD-10-CM | POA: Diagnosis not present

## 2022-05-27 DIAGNOSIS — D649 Anemia, unspecified: Secondary | ICD-10-CM | POA: Diagnosis not present

## 2022-05-27 DIAGNOSIS — Z800 Family history of malignant neoplasm of digestive organs: Secondary | ICD-10-CM

## 2022-05-27 DIAGNOSIS — Z7901 Long term (current) use of anticoagulants: Secondary | ICD-10-CM | POA: Diagnosis not present

## 2022-05-27 DIAGNOSIS — R195 Other fecal abnormalities: Secondary | ICD-10-CM

## 2022-05-27 LAB — BASIC METABOLIC PANEL
Anion gap: 11 (ref 5–15)
BUN: 6 mg/dL — ABNORMAL LOW (ref 8–23)
CO2: 28 mmol/L (ref 22–32)
Calcium: 8.5 mg/dL — ABNORMAL LOW (ref 8.9–10.3)
Chloride: 102 mmol/L (ref 98–111)
Creatinine, Ser: 0.85 mg/dL (ref 0.44–1.00)
GFR, Estimated: >60 mL/min (ref >60)
Glucose, Bld: 97 mg/dL (ref 70–99)
Potassium: 2.8 mmol/L — ABNORMAL LOW (ref 3.5–5.1)
Sodium: 141 mmol/L (ref 135–145)

## 2022-05-27 LAB — URINALYSIS, ROUTINE W REFLEX MICROSCOPIC
Bilirubin Urine: NEGATIVE
Glucose, UA: NEGATIVE mg/dL
Hgb urine dipstick: NEGATIVE
Ketones, ur: 5 mg/dL — AB
Leukocytes,Ua: NEGATIVE
Nitrite: NEGATIVE
Protein, ur: NEGATIVE mg/dL
Specific Gravity, Urine: 1.019 (ref 1.005–1.030)
pH: 5 (ref 5.0–8.0)

## 2022-05-27 LAB — CBC
HCT: 23 % — ABNORMAL LOW (ref 36.0–46.0)
Hemoglobin: 7.4 g/dL — ABNORMAL LOW (ref 12.0–15.0)
MCH: 30.5 pg (ref 26.0–34.0)
MCHC: 32.2 g/dL (ref 30.0–36.0)
MCV: 94.7 fL (ref 80.0–100.0)
Platelets: 165 10*3/uL (ref 150–400)
RBC: 2.43 MIL/uL — ABNORMAL LOW (ref 3.87–5.11)
RDW: 20.7 % — ABNORMAL HIGH (ref 11.5–15.5)
WBC: 3.7 10*3/uL — ABNORMAL LOW (ref 4.0–10.5)
nRBC: 0 % (ref 0.0–0.2)

## 2022-05-27 LAB — MAGNESIUM: Magnesium: 1.8 mg/dL (ref 1.7–2.4)

## 2022-05-27 LAB — HEMOGLOBIN AND HEMATOCRIT, BLOOD
HCT: 28 % — ABNORMAL LOW (ref 36.0–46.0)
Hemoglobin: 8.9 g/dL — ABNORMAL LOW (ref 12.0–15.0)

## 2022-05-27 LAB — PHOSPHORUS: Phosphorus: 3.7 mg/dL (ref 2.5–4.6)

## 2022-05-27 MED ORDER — SODIUM CHLORIDE 0.9 % IV SOLN: INTRAVENOUS | Status: DC

## 2022-05-27 MED ORDER — POTASSIUM CHLORIDE CRYS ER 20 MEQ PO TBCR
40.0000 meq | EXTENDED_RELEASE_TABLET | Freq: Four times a day (QID) | ORAL | Status: AC
  Administered 2022-05-27 – 2022-05-28 (×4): 40 meq via ORAL
  Filled 2022-05-27 (×4): qty 2

## 2022-05-27 MED ORDER — METOPROLOL SUCCINATE ER 50 MG PO TB24
50.0000 mg | ORAL_TABLET | Freq: Every day | ORAL | Status: DC
  Administered 2022-05-27 – 2022-06-08 (×13): 50 mg via ORAL
  Filled 2022-05-27 (×14): qty 1

## 2022-05-27 MED ORDER — IRBESARTAN 300 MG PO TABS
300.0000 mg | ORAL_TABLET | Freq: Every day | ORAL | Status: DC
  Administered 2022-05-27 – 2022-06-08 (×13): 300 mg via ORAL
  Filled 2022-05-27 (×14): qty 1

## 2022-05-27 MED ORDER — PIPERACILLIN-TAZOBACTAM 3.375 G IVPB
3.3750 g | Freq: Three times a day (TID) | INTRAVENOUS | Status: AC
  Administered 2022-05-27 – 2022-06-03 (×20): 3.375 g via INTRAVENOUS
  Filled 2022-05-27 (×20): qty 50

## 2022-05-27 MED ORDER — ICOSAPENT ETHYL 1 G PO CAPS
2.0000 g | ORAL_CAPSULE | Freq: Two times a day (BID) | ORAL | Status: DC
  Administered 2022-05-27 – 2022-06-13 (×33): 2 g via ORAL
  Filled 2022-05-27 (×34): qty 2

## 2022-05-27 MED ORDER — POTASSIUM CHLORIDE CRYS ER 20 MEQ PO TBCR
40.0000 meq | EXTENDED_RELEASE_TABLET | Freq: Two times a day (BID) | ORAL | Status: DC
Start: 2022-05-27 — End: 2022-05-27
  Filled 2022-05-27: qty 2

## 2022-05-27 NOTE — Progress Notes (Cosign Needed Addendum)
[facility] Progress Note  CC:  Anemia/GI bleeding and abnormal CT scan  Subjective:  Feeling a little better after getting some blood yesterday.  Abdomen still feel sore and bloated.  Not having anymore diarrhea or rectal bleeding.  Had a small semi-formed stool that I saw in the hat on the toilet.  Objective:  Vital signs in last 24 hours: Temp:  [97.8 F (36.6 C)-98.4 F (36.9 C)] 97.8 F (36.6 C) (07/28 0740) Pulse Rate:  [58-72] 68 (07/28 0740) Resp:  [13-20] 13 (07/28 0740) BP: (122-160)/(61-93) 144/82 (07/28 0740) SpO2:  [91 %-100 %] 93 % (07/28 0740) Weight:  [269.4 kg] 111.2 kg (07/27 1043) Last BM Date : 05/26/22 General:  Alert, Well-developed, in NAD Heart:  Regular rate and rhythm; no murmurs Pulm:  CTAB.  No W/R/R. Abdomen:  Soft, non-distended.  BS present.  TTP on the right. Extremities:  Without edema. Neurologic:  Alert and oriented x 4;  grossly normal neurologically. Psych:  Alert and cooperative. Normal mood and affect.  Intake/Output from previous day: 07/27 0701 - 07/28 0700 In: 947 [Blood:947] Out: 2 [Urine:1; Stool:1]  Lab Results: Recent Labs    05/25/22 0000 05/26/22 0520 05/26/22 1845 05/27/22 0122  WBC 5.2 5.3  --  3.7*  HGB 6.0* 6.6* 8.6* 7.4*  HCT 18.6* 21.2* 26.8* 23.0*  PLT 217 244  --  165   BMET Recent Labs    05/25/22 0000 05/26/22 0520 05/27/22 0122  NA 142 139 141  K 3.6 3.1* 2.8*  CL 103 101 102  CO2 33* 30 28  GLUCOSE 103* 136* 97  BUN 9 9 6*  CREATININE 0.85 1.18* 0.85  CALCIUM 8.4* 8.7* 8.5*   LFT Recent Labs    05/26/22 0647  PROT 5.5*  ALBUMIN 2.8*  AST 40  ALT 28  ALKPHOS 71  BILITOT 0.5  BILIDIR <0.1  IBILI NOT CALCULATED   PT/INR Recent Labs    05/26/22 0520  LABPROT 13.9  INR 1.1   CT ANGIO GI BLEED  Result Date: 05/26/2022 CLINICAL DATA:  GI bleed, lower Right sided abdominal pain; GI bleeding, but no active bleeding--none since Monday EXAM: CTA ABDOMEN AND PELVIS  WITHOUT AND WITH CONTRAST TECHNIQUE: Multidetector CT imaging of the abdomen and pelvis was performed using the standard protocol during bolus administration of intravenous contrast. Multiplanar reconstructed images and MIPs were obtained and reviewed to evaluate the vascular anatomy. RADIATION DOSE REDUCTION: This exam was performed according to the departmental dose-optimization program which includes automated exposure control, adjustment of the mA and/or kV according to patient size and/or use of iterative reconstruction technique. CONTRAST:  138m OMNIPAQUE IOHEXOL 350 MG/ML SOLN COMPARISON:  None Available. FINDINGS: VASCULAR No active hemorrhage noted. Aorta: Moderate to severe atherosclerotic plaque. Normal caliber aorta without aneurysm, dissection, vasculitis or significant stenosis. Celiac: Patent without evidence of aneurysm, dissection, vasculitis or significant stenosis. SMA: Patent without evidence of aneurysm, dissection, vasculitis or significant stenosis. Renals: Both renal arteries are patent without evidence of aneurysm, dissection, vasculitis, fibromuscular dysplasia or significant stenosis. IMA: Patent without evidence of aneurysm, dissection, vasculitis or significant stenosis. Inflow: Patent without evidence of aneurysm, dissection, vasculitis or significant stenosis. Proximal Outflow: Bilateral common femoral and visualized portions of the superficial and profunda femoral arteries are patent without evidence of aneurysm, dissection, vasculitis or significant stenosis. Veins: The portal vein, splenic, superior mesenteric veins are patent. Review of the MIP images confirms the above findings. NON-VASCULAR Lower chest: Passive atelectasis of bilateral lower lobes.  Trace to small right and trace left pleural effusion. Tiny hiatal hernia. Hepatobiliary: No focal liver abnormality. No gallstones, gallbladder wall thickening, or pericholecystic fluid. No biliary dilatation. Pancreas: No focal  lesion. Normal pancreatic contour. No surrounding inflammatory changes. No main pancreatic ductal dilatation. Spleen: Normal in size without focal abnormality. Adrenals/Urinary Tract: No adrenal nodule bilaterally. Bilateral kidneys enhance symmetrically. No hydronephrosis. No hydroureter. The urinary bladder is unremarkable. Stomach/Bowel: Stomach is within normal limits. No evidence of small bowel wall thickening or dilatation. Scattered colonic diverticulosis. There is a ascending colon/hepatic flexure diverticula with associated trace bowel thickening and mild surrounding pericolonic fat stranding. Appendix appears normal. Lymphatic: No lymphadenopathy. Reproductive: Uterus and bilateral adnexa are unremarkable. Other: No intraperitoneal free fluid. No intraperitoneal free gas. No organized fluid collection. Musculoskeletal: Diffuse subcutaneus soft tissue edema. No hernia. Likely bilateral inguinal hernia repairs given tacks noted. No suspicious lytic or blastic osseous lesions. No acute displaced fracture. Multilevel degenerative changes of the spine. IMPRESSION: VASCULAR 1. No active hemorrhage. 2.  Aortic Atherosclerosis (ICD10-I70.0). NON-VASCULAR 1. Colonic diverticulosis with developing or resolving uncomplicated mild acute diverticulitis of the ascending colon/hepatic flexure. Recommend colonoscopy status post treatment and status post complete resolution of inflammatory changes to exclude an underlying lesion. 2. Bilateral trace to small volume, right greater than left, pleural effusions. 3. Tiny hiatal hernia. Electronically Signed   By: Iven Finn M.D.   On: 05/26/2022 17:11    Assessment / Plan: *71 year old female with complaints of right-sided abdominal pain and GI/rectal bleed.  CT scan x2 earlier this month showing right colon pneumatosis, uncertain etiology, but appearance concerning for some degree of necrosis either from mesenteric ischemia or other. Mass not definitely seen but not  excluded, no free air.  Completed abx including IV abx in hospital at [facility] and then Augmentin as outpatient.  Improved but still does not feel well.  Of note:  Interestingly she fell at home and hit the right side of her abdomen, but says that the vomiting and diarrhea immediately followed the fall and she was having nausea prior to falling and hitting her right side, which is why she was rushing to get to the bathroom.  Repeat CTA here shows improvement and they are calling it resolving uncomplicated mild acute diverticulitis of the ascending colon/hepatic flexure. *Blood loss anemia: Hemoglobin in the 9 g range couple of weeks ago while at [facility], last was 10.7 g on 7/14 mg to 6.0/6.6 g here.  This is likely related to her GI losses.  Received 2 units PRBCs.  Hgb improved to 8.6 grams then down to 7.4 grams again this AM. *Atrial fibrillation with history of ablation, was on Eliquis but it was held on Monday, July 24th due to ongoing GI bleeding. *Hypokalemia with potassium of 2.8 this AM.  Replacement per primary service. *Family history of colon cancer in her father diagnosed and passed away in his 37s: Patient's last colonoscopy was in 2010 that was normal with repeat recommended in 5 years.  Inappropriate Cologuard in 2022 was positive, but colonoscopy never performed due to developing COVID.  -Cdiff and GI pathogen panels have not yet been collected.  I cancelled them because she just had a small semi-formed stools, not have diarrhea anymore. -Will start her on some Zosyn.  Consulted pharmacy. -Will need colonoscopy at some point, but possibly as an outpatient in the coming weeks due to diverticulitis diagnosis on CT scan here. -? When to restart Eliquis. -Continue clear liquids for today.  Maybe advance  to full liquids in AM.     LOS: 1 day   Laban Emperor. Deniya Craigo  05/27/2022, 9:02 AM

## 2022-05-27 NOTE — Progress Notes (Signed)
PROGRESS NOTE    [name]  NLZ:[id number] DOB: [birth year] DOA: 05/26/2022 PCP: Silverio Decamp, MD    Brief Narrative:  71 year old with history of hypertension, hyperlipidemia, chronic A-fib on Eliquis status post ablation, hypothyroidism GERD and anxiety presented to the ER with low hemoglobin.  Patient recently suffering from multiple abdominal symptoms ongoing nausea vomiting poor appetite and diarrhea since 7/9. 7/9-7/17, admitted to [place] with nausea vomiting and diarrhea, CT scan consistent with right-sided colitis and pneumatosis coli in the proximal hepatic flexure.  Initially treated with IV antibiotics, changed to Augmentin and discharged home.  Completed 5 days of additional antibiotic therapy.  Baseline hemoglobin 12.9-8.5 on discharge from the hospital.  Seen by GI and they were planning for outpatient follow-up. Telemetry visit on 7/24, reported blood with the stool and was asked to stop Eliquis. Patient started feeling very weak, intermittently wheezing and shortness of breath with exertion, ongoing abdominal pain so was seen at her PCP , Hb was 6 and sent to ER.  On admission, blood pressures 96/38 improved with IV fluids.  Hemoglobin 6.6, potassium 3.1.  Patient is started on 2 units of blood transfusion and admitted to hospital with GI consultation.   Assessment & Plan:   Symptomatic anemia, acute blood loss anemia secondary to lower GI bleeding.  Suspected persistent ischemic colitis/right-sided diverticulitis: Hemoglobin 6.6 on presentation-2 units PRBC-8.  6-7.4.  Currently no evidence of ongoing bleeding.  We will recheck in the evening before deciding on the transfusion. Right-sided colitis, present on CT scan from 7/13 at [place], CT angiogram 7/27 negative for active bleeding, persistent inflammation hepatic flexure. Less likely C. difficile with no ongoing diarrhea. Symptomatic treatment, pain medications, IV fluids, clear liquid diet,  IV antibiotics with Zosyn.  Anticipate conservative management due to ongoing pain.  May need colonoscopy in the near future.  GI following.  Acute kidney injury, severe hypokalemia due to ongoing fluid loss: On maintenance IV fluids.  Renal function stabilized. Replace potassium aggressively.  We will check magnesium and phosphorus and replace if low. Maintenance IV fluids today.  Paroxysmal atrial fibrillation: Sinus rhythm now.  Patient on flecainide.  Eliquis on hold.  No need to bridge with heparin today.  Once she stops dropping her hemoglobin, will put her back on Eliquis/heparin bridging if any colonoscopy planned.  Essential hypertension: Blood pressures fairly stable.  Elevated today.  Resume Toprol and Diovan, hold diuretics for now.  Hypothyroidism: Euthyroid on Synthroid replacement.    DVT prophylaxis: SCDs Start: 05/26/22 0804   Code Status: full code  Family Communication: Son on the phone  Disposition Plan: Status is: Inpatient Remains inpatient appropriate because: Active abdominal pain.  Blood transfusions.     Consultants:  Gastroenterology  Procedures:  None  Antimicrobials:  IV Zosyn 7/28---   Subjective: Patient seen and examined in the morning rounds.  Complains of mild to moderate diffuse abdominal pain.  Does not have an appetite but denies any nausea or vomiting.  Only had 1 small bowel movement that was without any blood since last 24 hours.  Objective: Vitals:   05/27/22 0007 05/27/22 0320 05/27/22 0740 05/27/22 1128  BP: 134/71 (!) 141/89 (!) 144/82 (!) 181/86  Pulse: 72 70 68 80  Resp: '16 16 13 15  '$ Temp: 97.9 F (36.6 C) 97.8 F (36.6 C) 97.8 F (36.6 C) 97.8 F (36.6 C)  TempSrc: Oral Oral Oral Oral  SpO2: 91% 93% 93% 95%  Weight:      Height:  Intake/Output Summary (Last 24 hours) at 05/27/2022 1139 Last data filed at 05/26/2022 2032 Gross per 24 hour  Intake 315 ml  Output 2 ml  Net 313 ml   Filed Weights   05/26/22  1043  Weight: 111.2 kg    Examination:  General exam: Anxious.  Calm and in mild distress with pain. Respiratory system: No added sounds. Cardiovascular system: S1 & S2 heard, RRR. No JVD, murmurs, rubs, gallops or clicks.  Trace bilateral pedal edema. Gastrointestinal system: Soft.  Diffuse tenderness without rigidity or guarding.  Bowel sound present.  Nondistended. Central nervous system: Alert and oriented. No focal neurological deficits. Extremities: Symmetric 5 x 5 power. Skin: No rashes, lesions or ulcers Psychiatry: Anxious.    Data Reviewed: I have personally reviewed following labs and imaging studies  CBC: Recent Labs  Lab 05/25/22 0000 05/26/22 0520 05/26/22 1845 05/27/22 0122  WBC 5.2 5.3  --  3.7*  NEUTROABS 3,489 3.6  --   --   HGB 6.0* 6.6* 8.6* 7.4*  HCT 18.6* 21.2* 26.8* 23.0*  MCV 100.5* 105.0*  --  94.7  PLT 217 244  --  601   Basic Metabolic Panel: Recent Labs  Lab 05/25/22 0000 05/26/22 0520 05/27/22 0122  NA 142 139 141  K 3.6 3.1* 2.8*  CL 103 101 102  CO2 33* 30 28  GLUCOSE 103* 136* 97  BUN 9 9 6*  CREATININE 0.85 1.18* 0.85  CALCIUM 8.4* 8.7* 8.5*   GFR: Estimated Creatinine Clearance: 72.7 mL/min (by C-G formula based on SCr of 0.85 mg/dL). Liver Function Tests: Recent Labs  Lab 05/25/22 0000 05/26/22 0647  AST 21 40  ALT 15 28  ALKPHOS  --  71  BILITOT 0.6 0.5  PROT 4.8* 5.5*  ALBUMIN  --  2.8*   Recent Labs  Lab 05/25/22 0000  LIPASE 9  AMYLASE 19*   No results for input(s): "AMMONIA" in the last 168 hours. Coagulation Profile: Recent Labs  Lab 05/26/22 0520  INR 1.1   Cardiac Enzymes: No results for input(s): "CKTOTAL", "CKMB", "CKMBINDEX", "TROPONINI" in the last 168 hours. BNP (last 3 results) No results for input(s): "PROBNP" in the last 8760 hours. HbA1C: No results for input(s): "HGBA1C" in the last 72 hours. CBG: No results for input(s): "GLUCAP" in the last 168 hours. Lipid Profile: No results  for input(s): "CHOL", "HDL", "LDLCALC", "TRIG", "CHOLHDL", "LDLDIRECT" in the last 72 hours. Thyroid Function Tests: No results for input(s): "TSH", "T4TOTAL", "FREET4", "T3FREE", "THYROIDAB" in the last 72 hours. Anemia Panel: No results for input(s): "VITAMINB12", "FOLATE", "FERRITIN", "TIBC", "IRON", "RETICCTPCT" in the last 72 hours. Sepsis Labs: No results for input(s): "PROCALCITON", "LATICACIDVEN" in the last 168 hours.  No results found for this or any previous visit (from the past 240 hour(s)).       Radiology Studies: CT ANGIO GI BLEED  Result Date: 05/26/2022 CLINICAL DATA:  GI bleed, lower Right sided abdominal pain; GI bleeding, but no active bleeding--none since Monday EXAM: CTA ABDOMEN AND PELVIS WITHOUT AND WITH CONTRAST TECHNIQUE: Multidetector CT imaging of the abdomen and pelvis was performed using the standard protocol during bolus administration of intravenous contrast. Multiplanar reconstructed images and MIPs were obtained and reviewed to evaluate the vascular anatomy. RADIATION DOSE REDUCTION: This exam was performed according to the departmental dose-optimization program which includes automated exposure control, adjustment of the mA and/or kV according to patient size and/or use of iterative reconstruction technique. CONTRAST:  1102m OMNIPAQUE IOHEXOL 350 MG/ML SOLN COMPARISON:  None Available. FINDINGS: VASCULAR No active hemorrhage noted. Aorta: Moderate to severe atherosclerotic plaque. Normal caliber aorta without aneurysm, dissection, vasculitis or significant stenosis. Celiac: Patent without evidence of aneurysm, dissection, vasculitis or significant stenosis. SMA: Patent without evidence of aneurysm, dissection, vasculitis or significant stenosis. Renals: Both renal arteries are patent without evidence of aneurysm, dissection, vasculitis, fibromuscular dysplasia or significant stenosis. IMA: Patent without evidence of aneurysm, dissection, vasculitis or significant  stenosis. Inflow: Patent without evidence of aneurysm, dissection, vasculitis or significant stenosis. Proximal Outflow: Bilateral common femoral and visualized portions of the superficial and profunda femoral arteries are patent without evidence of aneurysm, dissection, vasculitis or significant stenosis. Veins: The portal vein, splenic, superior mesenteric veins are patent. Review of the MIP images confirms the above findings. NON-VASCULAR Lower chest: Passive atelectasis of bilateral lower lobes. Trace to small right and trace left pleural effusion. Tiny hiatal hernia. Hepatobiliary: No focal liver abnormality. No gallstones, gallbladder wall thickening, or pericholecystic fluid. No biliary dilatation. Pancreas: No focal lesion. Normal pancreatic contour. No surrounding inflammatory changes. No main pancreatic ductal dilatation. Spleen: Normal in size without focal abnormality. Adrenals/Urinary Tract: No adrenal nodule bilaterally. Bilateral kidneys enhance symmetrically. No hydronephrosis. No hydroureter. The urinary bladder is unremarkable. Stomach/Bowel: Stomach is within normal limits. No evidence of small bowel wall thickening or dilatation. Scattered colonic diverticulosis. There is a ascending colon/hepatic flexure diverticula with associated trace bowel thickening and mild surrounding pericolonic fat stranding. Appendix appears normal. Lymphatic: No lymphadenopathy. Reproductive: Uterus and bilateral adnexa are unremarkable. Other: No intraperitoneal free fluid. No intraperitoneal free gas. No organized fluid collection. Musculoskeletal: Diffuse subcutaneus soft tissue edema. No hernia. Likely bilateral inguinal hernia repairs given tacks noted. No suspicious lytic or blastic osseous lesions. No acute displaced fracture. Multilevel degenerative changes of the spine. IMPRESSION: VASCULAR 1. No active hemorrhage. 2.  Aortic Atherosclerosis (ICD10-I70.0). NON-VASCULAR 1. Colonic diverticulosis with  developing or resolving uncomplicated mild acute diverticulitis of the ascending colon/hepatic flexure. Recommend colonoscopy status post treatment and status post complete resolution of inflammatory changes to exclude an underlying lesion. 2. Bilateral trace to small volume, right greater than left, pleural effusions. 3. Tiny hiatal hernia. Electronically Signed   By: Iven Finn M.D.   On: 05/26/2022 17:11        Scheduled Meds:  flecainide  50 mg Oral Q12H   gabapentin  1,200 mg Oral QHS   icosapent Ethyl  2 g Oral BID   irbesartan  300 mg Oral Daily   levothyroxine  75 mcg Oral Q breakfast   metoprolol succinate  50 mg Oral Daily   pantoprazole  40 mg Oral Daily   potassium chloride  40 mEq Oral Q6H   ramelteon  8 mg Oral QHS   sodium chloride flush  3 mL Intravenous Q12H   traZODone  100 mg Oral QHS   venlafaxine XR  225 mg Oral Daily   Continuous Infusions:  sodium chloride     sodium chloride     piperacillin-tazobactam (ZOSYN)  IV 3.375 g (05/27/22 1045)     LOS: 1 day    Time spent: 35 minutes    Barb Merino, MD [facility] Pager [phone]

## 2022-05-27 NOTE — Progress Notes (Signed)
Pharmacy Antibiotic Note  [name] is a 71 y.o. female admitted on 05/26/2022 with  possible intraabdominal infection .  Pharmacy has been consulted for Zosyn dosing.  Plan: Zosyn 3.375g IV q8h (4 hour infusion). F/u imaging, renal function and clinical course.  Height: '5\' 3"'$  (160 cm) Weight: 111.2 kg (245 lb 2.4 oz) IBW/kg (Calculated) : 52.4  Temp (24hrs), Avg:97.9 F (36.6 C), Min:97.8 F (36.6 C), Max:98.2 F (36.8 C)  Recent Labs  Lab 05/25/22 0000 05/26/22 0520 05/27/22 0122  WBC 5.2 5.3 3.7*  CREATININE 0.85 1.18* 0.85    Estimated Creatinine Clearance: 72.7 mL/min (by C-G formula based on SCr of 0.85 mg/dL).    No Known Allergies  Antimicrobials this admission: Zosyn 7/28 >   Dose adjustments this admission:  Microbiology results:  Thank you for allowing pharmacy to be a part of this patient's care.  [place] Crane, Roylene Reason, BCCP Clinical Pharmacist  05/27/2022 9:49 AM   [facility] pharmacy phone numbers are listed on [email or web address]

## 2022-05-28 ENCOUNTER — Inpatient Hospital Stay (HOSPITAL_COMMUNITY): Payer: Medicare Other

## 2022-05-28 DIAGNOSIS — D649 Anemia, unspecified: Secondary | ICD-10-CM | POA: Diagnosis not present

## 2022-05-28 LAB — BASIC METABOLIC PANEL
Anion gap: 6 (ref 5–15)
BUN: <5 mg/dL — ABNORMAL LOW (ref 8–23)
CO2: 30 mmol/L (ref 22–32)
Calcium: 8.3 mg/dL — ABNORMAL LOW (ref 8.9–10.3)
Chloride: 108 mmol/L (ref 98–111)
Creatinine, Ser: 0.77 mg/dL (ref 0.44–1.00)
GFR, Estimated: >60 mL/min (ref >60)
Glucose, Bld: 103 mg/dL — ABNORMAL HIGH (ref 70–99)
Potassium: 4.2 mmol/L (ref 3.5–5.1)
Sodium: 144 mmol/L (ref 135–145)

## 2022-05-28 LAB — HEMOGLOBIN AND HEMATOCRIT, BLOOD
HCT: 26.6 % — ABNORMAL LOW (ref 36.0–46.0)
HCT: 27.8 % — ABNORMAL LOW (ref 36.0–46.0)
Hemoglobin: 8.3 g/dL — ABNORMAL LOW (ref 12.0–15.0)
Hemoglobin: 8.6 g/dL — ABNORMAL LOW (ref 12.0–15.0)

## 2022-05-28 MED ORDER — HYOSCYAMINE SULFATE 0.125 MG SL SUBL
0.1250 mg | SUBLINGUAL_TABLET | Freq: Three times a day (TID) | SUBLINGUAL | Status: DC | PRN
  Administered 2022-06-04 – 2022-06-05 (×2): 0.125 mg via SUBLINGUAL
  Filled 2022-05-28 (×3): qty 1

## 2022-05-28 NOTE — Progress Notes (Signed)
Gastroenterology Inpatient Follow-up Note   PATIENT IDENTIFICATION  [name] is a 71 y.o. female with a pmh significant for atrial fibrillation (on anticoagulation- recently held), hypertension, hyperlipidemia, anxiety, MDD, GERD, family history colon cancer, positive Cologuard, recent admission for large bowel pneumatosis- on antibiotics.  Readmitted with abdominal pain and discomfort and rectal bleeding and imaging concerning for improving right-sided diverticulitis versus masslike lesion. Hospital Day: 3  SUBJECTIVE  The patient's chart was reviewed this morning. Patient evaluated this morning.   She has been on clear liquid diet. However she feels today worse than she did yesterday.  She is expressing more significant lower abdominal discomfort and pain on light palpation of her skin in her large pannus region. She feels a slight cramping sensation. When talking with her about how she felt when she left the last hospital stay she was having improved pain but still had pain that never really went away and was what progressed and worsened to her coming back in with the rectal bleeding. The patient denies any fevers or chills. She is hopeful that she can get out of the hospital soon and is willing to undergo a colonoscopy as an outpatient with the previous GI doctors which seen her recently. She is getting more anxious pacing in the hospital longer   OBJECTIVE  Scheduled Inpatient Medications:   flecainide  50 mg Oral Q12H   gabapentin  1,200 mg Oral QHS   icosapent Ethyl  2 g Oral BID   irbesartan  300 mg Oral Daily   levothyroxine  75 mcg Oral Q breakfast   metoprolol succinate  50 mg Oral Daily   pantoprazole  40 mg Oral Daily   ramelteon  8 mg Oral QHS   sodium chloride flush  3 mL Intravenous Q12H   traZODone  100 mg Oral QHS   venlafaxine XR  225 mg Oral Daily   Continuous Inpatient Infusions:   sodium chloride     sodium chloride 100 mL/hr at 05/28/22 1205    piperacillin-tazobactam (ZOSYN)  IV 12.5 mL/hr at 05/28/22 1205   PRN Inpatient Medications: acetaminophen **OR** acetaminophen, albuterol, hyoscyamine, ondansetron **OR** ondansetron (ZOFRAN) IV, oxyCODONE-acetaminophen   Physical Examination  Temp:  [97.6 F (36.4 C)-98.5 F (36.9 C)] 98.3 F (36.8 C) (07/29 1158) Pulse Rate:  [60-86] 63 (07/29 1158) Resp:  [14-20] 14 (07/29 1158) BP: (118-151)/(60-86) 143/60 (07/29 1158) SpO2:  [90 %-100 %] 93 % (07/29 1158) Temp (24hrs), Avg:97.9 F (36.6 C), Min:97.6 F (36.4 C), Max:98.5 F (36.9 C)  Weight: 111.2 kg GEN: NAD, appears chronically ill but is nontoxic  PSYCH: Cooperative, without pressured speech EYE: Conjunctivae pink, sclerae anicteric ENT: MMM CV: Nontachycardic RESP: No audible wheezing GI: NABS, soft, obese, large pannus present, TTP on light palpation of the lower abdomen MSK/EXT: Lower extremity edema bilaterally (1+) SKIN: No jaundice NEURO:  Alert & Oriented x 3, no focal deficits   Review of Data   Laboratory Studies   Recent Labs  Lab 05/27/22 0122 05/28/22 0535  NA 141 144  K 2.8* 4.2  CL 102 108  CO2 28 30  BUN 6* <5*  CREATININE 0.85 0.77  GLUCOSE 97 103*  CALCIUM 8.5* 8.3*  MG 1.8  --   PHOS 3.7  --    Recent Labs  Lab 05/26/22 0647  AST 40  ALT 28  ALKPHOS 71    Recent Labs  Lab 05/25/22 0000 05/26/22 0520 05/26/22 1845 05/27/22 0122 05/27/22 1755 05/28/22 0535  WBC 5.2  5.3  --  3.7*  --   --   HGB 6.0* 6.6*   < > 7.4*   < > 8.3*  HCT 18.6* 21.2*   < > 23.0*   < > 26.6*  PLT 217 244  --  165  --   --    < > = values in this interval not displayed.   Recent Labs  Lab 05/26/22 0520  INR 1.1   Imaging Studies  CT ANGIO GI BLEED  Result Date: 05/26/2022 CLINICAL DATA:  GI bleed, lower Right sided abdominal pain; GI bleeding, but no active bleeding--none since Monday EXAM: CTA ABDOMEN AND PELVIS WITHOUT AND WITH CONTRAST TECHNIQUE: Multidetector CT imaging of the abdomen and  pelvis was performed using the standard protocol during bolus administration of intravenous contrast. Multiplanar reconstructed images and MIPs were obtained and reviewed to evaluate the vascular anatomy. RADIATION DOSE REDUCTION: This exam was performed according to the departmental dose-optimization program which includes automated exposure control, adjustment of the mA and/or kV according to patient size and/or use of iterative reconstruction technique. CONTRAST:  150m OMNIPAQUE IOHEXOL 350 MG/ML SOLN COMPARISON:  None Available. FINDINGS: VASCULAR No active hemorrhage noted. Aorta: Moderate to severe atherosclerotic plaque. Normal caliber aorta without aneurysm, dissection, vasculitis or significant stenosis. Celiac: Patent without evidence of aneurysm, dissection, vasculitis or significant stenosis. SMA: Patent without evidence of aneurysm, dissection, vasculitis or significant stenosis. Renals: Both renal arteries are patent without evidence of aneurysm, dissection, vasculitis, fibromuscular dysplasia or significant stenosis. IMA: Patent without evidence of aneurysm, dissection, vasculitis or significant stenosis. Inflow: Patent without evidence of aneurysm, dissection, vasculitis or significant stenosis. Proximal Outflow: Bilateral common femoral and visualized portions of the superficial and profunda femoral arteries are patent without evidence of aneurysm, dissection, vasculitis or significant stenosis. Veins: The portal vein, splenic, superior mesenteric veins are patent. Review of the MIP images confirms the above findings. NON-VASCULAR Lower chest: Passive atelectasis of bilateral lower lobes. Trace to small right and trace left pleural effusion. Tiny hiatal hernia. Hepatobiliary: No focal liver abnormality. No gallstones, gallbladder wall thickening, or pericholecystic fluid. No biliary dilatation. Pancreas: No focal lesion. Normal pancreatic contour. No surrounding inflammatory changes. No main  pancreatic ductal dilatation. Spleen: Normal in size without focal abnormality. Adrenals/Urinary Tract: No adrenal nodule bilaterally. Bilateral kidneys enhance symmetrically. No hydronephrosis. No hydroureter. The urinary bladder is unremarkable. Stomach/Bowel: Stomach is within normal limits. No evidence of small bowel wall thickening or dilatation. Scattered colonic diverticulosis. There is a ascending colon/hepatic flexure diverticula with associated trace bowel thickening and mild surrounding pericolonic fat stranding. Appendix appears normal. Lymphatic: No lymphadenopathy. Reproductive: Uterus and bilateral adnexa are unremarkable. Other: No intraperitoneal free fluid. No intraperitoneal free gas. No organized fluid collection. Musculoskeletal: Diffuse subcutaneus soft tissue edema. No hernia. Likely bilateral inguinal hernia repairs given tacks noted. No suspicious lytic or blastic osseous lesions. No acute displaced fracture. Multilevel degenerative changes of the spine. IMPRESSION: VASCULAR 1. No active hemorrhage. 2.  Aortic Atherosclerosis (ICD10-I70.0). NON-VASCULAR 1. Colonic diverticulosis with developing or resolving uncomplicated mild acute diverticulitis of the ascending colon/hepatic flexure. Recommend colonoscopy status post treatment and status post complete resolution of inflammatory changes to exclude an underlying lesion. 2. Bilateral trace to small volume, right greater than left, pleural effusions. 3. Tiny hiatal hernia. Electronically Signed   By: MIven FinnM.D.   On: 05/26/2022 17:11    GI Procedures and Studies  No relevant studies to review   ASSESSMENT  Ms. BMarascois a 71y.o. female  with a pmh significant for atrial fibrillation (on anticoagulation- recently held), hypertension, hyperlipidemia, anxiety, MDD, GERD, family history colon cancer, positive Cologuard, recent admission for large bowel pneumatosis- on antibiotics.  Readmitted with abdominal pain and discomfort and  rectal bleeding and imaging concerning for improving right-sided diverticulitis versus masslike lesion.  The patient is hemodynamically stable today.  With this being said, clinically she feels a bit worse than yesterday.  I am going to order a KUB today though I do not expect that there will be anything worse at this time.  She has significant sensitivity in the area.  We are going to add Levsin as well in an effort of trying to see if that may be helpful for the patient.  We will plan to see how the patient does into tomorrow.  I would continue IV antibiotics at this time as well.  If the patient has continued improvement then hopefully she will be able to be discharged with anticoagulation hold for 1 week since admission.  Otherwise we may end up needing to do an endoscopic evaluation, albeit a higher risk colonoscopy/endoscopy, while she remains in-house.  Time will tell.  All patient questions were answered to the best of my ability, and the patient agrees to the aforementioned plan of action with follow-up as indicated.   PLAN/RECOMMENDATIONS  KUB ordered Continue clear liquid diet but may advance to full liquids if she feels up to it Levsin 0.125 mg sublingual 3 times daily as needed cramping/pain Continue Zosyn Holding on colonoscopy for now but will consider high risk if she stagnates - If we end up pursuing colonoscopy I likely would plan a repeat CT scan to be done prior just to ensure no evidence of new pneumatosis is developed If patient's pain becomes much more unbearable or worse, then I certainly think repeating cross-sectional imaging is not unreasonable to ensure were not missing a perforation or other complication Minimize narcotics as able Hold anticoagulation for at least 5 more days if able Patient to remain in-house for now   Please page/call with questions or concerns.   Justice Britain, MD [facility] Office # [phone]    LOS: 2  days  Irving Copas  05/28/2022, 12:44 PM

## 2022-05-28 NOTE — Progress Notes (Signed)
PROGRESS NOTE    [name]  TDH:[id number] DOB: 04-05-1951 DOA: 05/26/2022 PCP: Silverio Decamp, MD   Brief Narrative:  71 year old with history of hypertension, hyperlipidemia, chronic A-fib on Eliquis status post ablation, hypothyroidism GERD and anxiety presented to the ER with low hemoglobin.  Patient recently suffering from multiple abdominal symptoms ongoing nausea vomiting poor appetite and diarrhea since 7/9.  7/9-7/17, admitted to [facility] with nausea vomiting and diarrhea, CT scan consistent with right-sided colitis and pneumatosis coli in the proximal hepatic flexure.  Initially treated with IV antibiotics, changed to Augmentin and discharged home where she completed her antibiotic course.    7/24, reported blood with the stool and was asked to stop Eliquis -worsening weakness and fatigue -ultimately recommended to present to the ED.  Given profound anemia concern for GI bleed patient was transfused and GI was consulted   Assessment & Plan:   Principal Problem:   Symptomatic anemia Active Problems:   GI bleed   AKI (acute kidney injury) ([place])   History of colitis   Diarrhea   Paroxysmal atrial fibrillation (HCC)   Chronic anticoagulation   Benign essential hypertension   Hypothyroidism   GERD (gastroesophageal reflux disease)   Morbid obesity (HCC)   Acute symptomatic blood loss anemia, POA Secondary to acute GI bleed, suspected diverticular versus ischemic colitis -Hemoglobin 6.6 at intake status post 2 unit PRBC with no further bleeding and stable hemoglobin at this time -Questionable pneumatosis on previous imaging -GI following, appreciate insight recommendations, currently treating symptomatically, ongoing discussion on acute versus subacute need for imaging or endoscopy   Acute kidney injury, severe hypokalemia due to ongoing fluid loss: Continues on IV maintenance fluids until p.o. intake improves appropriately Advancing diet as  above per GI   Paroxysmal atrial fibrillation: Currently in sinus rhythm, Eliquis on hold given above GI bleed  Likely resume Eliquis in the next 24 to 48 hours given stable hemoglobin at the time of discharge, currently holding given acute bleed as above and possible need for procedure   Essential hypertension:  Continue metoprolol, irbesartan Diuretics remain on hold   Hypothyroidism:  Euthyroid on Synthroid replacement.  DVT prophylaxis: Early ambulation, SCDs in the setting of acute bleed Code Status: Full Family Communication: None present  Status is: Inpatient  Dispo: The patient is from: Home              Anticipated d/c is to: Home              Anticipated d/c date is: 48 to 72 hours pending clinical course and need for further procedure              Patient currently not medically stable for discharge  Consultants:  GI  Procedures:  None  Antimicrobials:  Zosyn  Subjective: No acute issues or events overnight tolerating p.o. somewhat poorly this morning otherwise denies intractable nausea vomiting diarrhea.  Denies any further bright red blood per rectum or dark tarry stools.  Objective: Vitals:   05/27/22 1435 05/27/22 1949 05/27/22 2310 05/28/22 0414  BP: (!) 146/71 127/65 118/85 133/72  Pulse:  60 64 62  Resp:  '19 20 15  '$ Temp:  97.7 F (36.5 C) 97.6 F (36.4 C) 98.5 F (36.9 C)  TempSrc:  Oral Oral Oral  SpO2:  100% 92% 90%  Weight:      Height:        Intake/Output Summary (Last 24 hours) at 05/28/2022 0803 Last data filed at 05/28/2022 0400  Gross per 24 hour  Intake 1290.5 ml  Output --  Net 1290.5 ml   Filed Weights   05/26/22 1043  Weight: 111.2 kg    Examination:  General exam: Appears calm and comfortable  Respiratory system: Clear to auscultation. Respiratory effort normal. Cardiovascular system: S1 & S2 heard, RRR. No JVD, murmurs, rubs, gallops or clicks. No pedal edema. Gastrointestinal system: Abdomen is nondistended, soft and  nontender. No organomegaly or masses felt. Normal bowel sounds heard. Central nervous system: Alert and oriented. No focal neurological deficits. Extremities: Symmetric 5 x 5 power. Skin: No rashes, lesions or ulcers Psychiatry: Judgement and insight appear normal. Mood & affect appropriate.     Data Reviewed: I have personally reviewed following labs and imaging studies  CBC: Recent Labs  Lab 05/25/22 0000 05/26/22 0520 05/26/22 1845 05/27/22 0122 05/27/22 1755 05/28/22 0535  WBC 5.2 5.3  --  3.7*  --   --   NEUTROABS 3,489 3.6  --   --   --   --   HGB 6.0* 6.6* 8.6* 7.4* 8.9* 8.3*  HCT 18.6* 21.2* 26.8* 23.0* 28.0* 26.6*  MCV 100.5* 105.0*  --  94.7  --   --   PLT 217 244  --  165  --   --    Basic Metabolic Panel: Recent Labs  Lab 05/25/22 0000 05/26/22 0520 05/27/22 0122 05/28/22 0535  NA 142 139 141 144  K 3.6 3.1* 2.8* 4.2  CL 103 101 102 108  CO2 33* '30 28 30  '$ GLUCOSE 103* 136* 97 103*  BUN 9 9 6* <5*  CREATININE 0.85 1.18* 0.85 0.77  CALCIUM 8.4* 8.7* 8.5* 8.3*  MG  --   --  1.8  --   PHOS  --   --  3.7  --    GFR: Estimated Creatinine Clearance: 77.3 mL/min (by C-G formula based on SCr of 0.77 mg/dL). Liver Function Tests: Recent Labs  Lab 05/25/22 0000 05/26/22 0647  AST 21 40  ALT 15 28  ALKPHOS  --  71  BILITOT 0.6 0.5  PROT 4.8* 5.5*  ALBUMIN  --  2.8*   Recent Labs  Lab 05/25/22 0000  LIPASE 9  AMYLASE 19*   No results for input(s): "AMMONIA" in the last 168 hours. Coagulation Profile: Recent Labs  Lab 05/26/22 0520  INR 1.1   Cardiac Enzymes: No results for input(s): "CKTOTAL", "CKMB", "CKMBINDEX", "TROPONINI" in the last 168 hours. BNP (last 3 results) No results for input(s): "PROBNP" in the last 8760 hours. HbA1C: No results for input(s): "HGBA1C" in the last 72 hours. CBG: No results for input(s): "GLUCAP" in the last 168 hours. Lipid Profile: No results for input(s): "CHOL", "HDL", "LDLCALC", "TRIG", "CHOLHDL",  "LDLDIRECT" in the last 72 hours. Thyroid Function Tests: No results for input(s): "TSH", "T4TOTAL", "FREET4", "T3FREE", "THYROIDAB" in the last 72 hours. Anemia Panel: No results for input(s): "VITAMINB12", "FOLATE", "FERRITIN", "TIBC", "IRON", "RETICCTPCT" in the last 72 hours. Sepsis Labs: No results for input(s): "PROCALCITON", "LATICACIDVEN" in the last 168 hours.  No results found for this or any previous visit (from the past 240 hour(s)).       Radiology Studies: CT ANGIO GI BLEED  Result Date: 05/26/2022 CLINICAL DATA:  GI bleed, lower Right sided abdominal pain; GI bleeding, but no active bleeding--none since Monday EXAM: CTA ABDOMEN AND PELVIS WITHOUT AND WITH CONTRAST TECHNIQUE: Multidetector CT imaging of the abdomen and pelvis was performed using the standard protocol during bolus administration of intravenous contrast.  Multiplanar reconstructed images and MIPs were obtained and reviewed to evaluate the vascular anatomy. RADIATION DOSE REDUCTION: This exam was performed according to the departmental dose-optimization program which includes automated exposure control, adjustment of the mA and/or kV according to patient size and/or use of iterative reconstruction technique. CONTRAST:  155m OMNIPAQUE IOHEXOL 350 MG/ML SOLN COMPARISON:  None Available. FINDINGS: VASCULAR No active hemorrhage noted. Aorta: Moderate to severe atherosclerotic plaque. Normal caliber aorta without aneurysm, dissection, vasculitis or significant stenosis. Celiac: Patent without evidence of aneurysm, dissection, vasculitis or significant stenosis. SMA: Patent without evidence of aneurysm, dissection, vasculitis or significant stenosis. Renals: Both renal arteries are patent without evidence of aneurysm, dissection, vasculitis, fibromuscular dysplasia or significant stenosis. IMA: Patent without evidence of aneurysm, dissection, vasculitis or significant stenosis. Inflow: Patent without evidence of aneurysm,  dissection, vasculitis or significant stenosis. Proximal Outflow: Bilateral common femoral and visualized portions of the superficial and profunda femoral arteries are patent without evidence of aneurysm, dissection, vasculitis or significant stenosis. Veins: The portal vein, splenic, superior mesenteric veins are patent. Review of the MIP images confirms the above findings. NON-VASCULAR Lower chest: Passive atelectasis of bilateral lower lobes. Trace to small right and trace left pleural effusion. Tiny hiatal hernia. Hepatobiliary: No focal liver abnormality. No gallstones, gallbladder wall thickening, or pericholecystic fluid. No biliary dilatation. Pancreas: No focal lesion. Normal pancreatic contour. No surrounding inflammatory changes. No main pancreatic ductal dilatation. Spleen: Normal in size without focal abnormality. Adrenals/Urinary Tract: No adrenal nodule bilaterally. Bilateral kidneys enhance symmetrically. No hydronephrosis. No hydroureter. The urinary bladder is unremarkable. Stomach/Bowel: Stomach is within normal limits. No evidence of small bowel wall thickening or dilatation. Scattered colonic diverticulosis. There is a ascending colon/hepatic flexure diverticula with associated trace bowel thickening and mild surrounding pericolonic fat stranding. Appendix appears normal. Lymphatic: No lymphadenopathy. Reproductive: Uterus and bilateral adnexa are unremarkable. Other: No intraperitoneal free fluid. No intraperitoneal free gas. No organized fluid collection. Musculoskeletal: Diffuse subcutaneus soft tissue edema. No hernia. Likely bilateral inguinal hernia repairs given tacks noted. No suspicious lytic or blastic osseous lesions. No acute displaced fracture. Multilevel degenerative changes of the spine. IMPRESSION: VASCULAR 1. No active hemorrhage. 2.  Aortic Atherosclerosis (ICD10-I70.0). NON-VASCULAR 1. Colonic diverticulosis with developing or resolving uncomplicated mild acute diverticulitis  of the ascending colon/hepatic flexure. Recommend colonoscopy status post treatment and status post complete resolution of inflammatory changes to exclude an underlying lesion. 2. Bilateral trace to small volume, right greater than left, pleural effusions. 3. Tiny hiatal hernia. Electronically Signed   By: MIven FinnM.D.   On: 05/26/2022 17:11        Scheduled Meds:  flecainide  50 mg Oral Q12H   gabapentin  1,200 mg Oral QHS   icosapent Ethyl  2 g Oral BID   irbesartan  300 mg Oral Daily   levothyroxine  75 mcg Oral Q breakfast   metoprolol succinate  50 mg Oral Daily   pantoprazole  40 mg Oral Daily   ramelteon  8 mg Oral QHS   sodium chloride flush  3 mL Intravenous Q12H   traZODone  100 mg Oral QHS   venlafaxine XR  225 mg Oral Daily   Continuous Infusions:  sodium chloride     sodium chloride 100 mL/hr at 05/28/22 0400   piperacillin-tazobactam (ZOSYN)  IV 3.375 g (05/28/22 0459)     LOS: 2 days   Time spent: 423m  Lamont Glasscock C Laporcha Marchesi, DO [facility]  If 7PM-7AM, please contact night-coverage [email or web address]  05/28/2022, 8:03 AM

## 2022-05-29 ENCOUNTER — Inpatient Hospital Stay (HOSPITAL_COMMUNITY): Payer: Medicare Other

## 2022-05-29 DIAGNOSIS — R1084 Generalized abdominal pain: Secondary | ICD-10-CM

## 2022-05-29 DIAGNOSIS — R140 Abdominal distension (gaseous): Secondary | ICD-10-CM

## 2022-05-29 DIAGNOSIS — D649 Anemia, unspecified: Secondary | ICD-10-CM | POA: Diagnosis not present

## 2022-05-29 DIAGNOSIS — R933 Abnormal findings on diagnostic imaging of other parts of digestive tract: Secondary | ICD-10-CM | POA: Diagnosis not present

## 2022-05-29 LAB — CBC
HCT: 27.8 % — ABNORMAL LOW (ref 36.0–46.0)
Hemoglobin: 8.7 g/dL — ABNORMAL LOW (ref 12.0–15.0)
MCH: 31 pg (ref 26.0–34.0)
MCHC: 31.3 g/dL (ref 30.0–36.0)
MCV: 98.9 fL (ref 80.0–100.0)
Platelets: 165 10*3/uL (ref 150–400)
RBC: 2.81 MIL/uL — ABNORMAL LOW (ref 3.87–5.11)
RDW: 19.2 % — ABNORMAL HIGH (ref 11.5–15.5)
WBC: 4.7 10*3/uL (ref 4.0–10.5)
nRBC: 0 % (ref 0.0–0.2)

## 2022-05-29 LAB — AMYLASE: Amylase: 18 U/L — ABNORMAL LOW (ref 28–100)

## 2022-05-29 LAB — LIPASE, BLOOD: Lipase: 25 U/L (ref 11–51)

## 2022-05-29 LAB — COMPREHENSIVE METABOLIC PANEL
ALT: 27 U/L (ref 0–44)
AST: 40 U/L (ref 15–41)
Albumin: 2.8 g/dL — ABNORMAL LOW (ref 3.5–5.0)
Alkaline Phosphatase: 78 U/L (ref 38–126)
Anion gap: 6 (ref 5–15)
BUN: 8 mg/dL (ref 8–23)
CO2: 27 mmol/L (ref 22–32)
Calcium: 8.1 mg/dL — ABNORMAL LOW (ref 8.9–10.3)
Chloride: 106 mmol/L (ref 98–111)
Creatinine, Ser: 0.85 mg/dL (ref 0.44–1.00)
GFR, Estimated: >60 mL/min (ref >60)
Glucose, Bld: 149 mg/dL — ABNORMAL HIGH (ref 70–99)
Potassium: 4 mmol/L (ref 3.5–5.1)
Sodium: 139 mmol/L (ref 135–145)
Total Bilirubin: 0.9 mg/dL (ref 0.3–1.2)
Total Protein: 5.5 g/dL — ABNORMAL LOW (ref 6.5–8.1)

## 2022-05-29 LAB — HEMOGLOBIN AND HEMATOCRIT, BLOOD
HCT: 26.5 % — ABNORMAL LOW (ref 36.0–46.0)
Hemoglobin: 8 g/dL — ABNORMAL LOW (ref 12.0–15.0)

## 2022-05-29 LAB — LACTIC ACID, PLASMA: Lactic Acid, Venous: 2 mmol/L (ref 0.5–1.9)

## 2022-05-29 MED ORDER — FUROSEMIDE 20 MG PO TABS
10.0000 mg | ORAL_TABLET | Freq: Two times a day (BID) | ORAL | Status: DC
  Administered 2022-05-29 – 2022-06-05 (×14): 10 mg via ORAL
  Filled 2022-05-29 (×14): qty 1

## 2022-05-29 MED ORDER — POLYETHYLENE GLYCOL 3350 17 G PO PACK: 17.0000 g | PACK | Freq: Every day | ORAL | Status: DC | PRN

## 2022-05-29 MED ORDER — FUROSEMIDE 10 MG/ML IJ SOLN
40.0000 mg | Freq: Once | INTRAMUSCULAR | Status: AC
  Administered 2022-05-29: 40 mg via INTRAVENOUS
  Filled 2022-05-29: qty 4

## 2022-05-29 MED ORDER — POLYETHYLENE GLYCOL 3350 17 G PO PACK: 17.0000 g | PACK | Freq: Every day | ORAL | Status: DC

## 2022-05-29 NOTE — Progress Notes (Addendum)
[facility] Progress Note  CC:  Anemia/GI bleeding and abnormal CT scan  Subjective: Says that she does not really feel any better today.  Her belly is still painful and feels very bloated and distended.  She was advanced to a regular diet and ate a little bit of breakfast, but got very full and felt like her pain somewhat increased.  Did not eat lunch.  Says she does not know if she would feel like eating dinner.  Bowel movements are no longer diarrhea and she is not having any more rectal bleeding.  Objective:  Vital signs in last 24 hours: Temp:  [97.5 F (36.4 C)-98.2 F (36.8 C)] 97.5 F (36.4 C) (07/30 1242) Pulse Rate:  [50-70] 66 (07/30 1242) Resp:  [15-20] 20 (07/30 1242) BP: (114-164)/(51-73) 164/73 (07/30 1242) SpO2:  [90 %-98 %] 90 % (07/30 1242) Last BM Date : 05/28/22 (per pt) General:  Alert, Well-developed, in NAD Heart:  Regular rate and rhythm; no murmurs Pulm:  CTAB.  No W/R/R. Abdomen:  Soft, somewhat distended-appearing.  BS present.  Diffuse TTP. Extremities:  Without edema. Neurologic:  Alert and oriented x 4;  grossly normal neurologically. Psych:  Alert and cooperative. Normal mood and affect.  Intake/Output from previous day: 07/29 0701 - 07/30 0700 In: 2700.8 [P.O.:120; I.V.:2530.8; IV Piggyback:50] Out: 2 [Urine:2] Intake/Output this shift: Total I/O In: 240 [P.O.:240] Out: 2 [Urine:1; Stool:1]  Lab Results: Recent Labs    05/27/22 0122 05/27/22 1755 05/28/22 0535 05/28/22 1752 05/29/22 0342  WBC 3.7*  --   --   --   --   HGB 7.4*   < > 8.3* 8.6* 8.0*  HCT 23.0*   < > 26.6* 27.8* 26.5*  PLT 165  --   --   --   --    < > = values in this interval not displayed.   BMET Recent Labs    05/27/22 0122 05/28/22 0535  NA 141 144  K 2.8* 4.2  CL 102 108  CO2 28 30  GLUCOSE 97 103*  BUN 6* <5*  CREATININE 0.85 0.77  CALCIUM 8.5* 8.3*   DG Abd 2 Views  Result Date: 05/28/2022 CLINICAL DATA:  [id number] abdominal pain  and blood in the stool EXAM: ABDOMEN - 2 VIEW COMPARISON:  None Available. FINDINGS: The bowel gas pattern is normal. There is no evidence of free air. No significant stool burden in the colon. No radio-opaque calculi or other significant radiographic abnormality is seen. Bilateral small pleural effusion. IMPRESSION: Bowel-gas pattern is nonobstructive.  Bilateral pleural effusion. Electronically Signed   By: Frazier Richards M.D.   On: 05/28/2022 13:44    Assessment / Plan: [name] is a 71 y.o. female with a pmh significant for atrial fibrillation (on anticoagulation- recently held), hypertension, hyperlipidemia, anxiety, MDD, GERD, family history colon cancer, positive Cologuard, recent admission for large bowel pneumatosis- on antibiotics.  Readmitted with abdominal pain and discomfort and rectal bleeding and imaging concerning for improving right-sided diverticulitis versus masslike lesion.   The patient is hemodynamically stable today.  With this being said, clinically she feels no better from yesterday.  KUB yesterday ok.  She has significant sensitivity in the area.  Added Levsin as well in an effort of trying to see if that may be helpful for the patient.  -She is on a regular diet so I wonder if she was advanced to quickly. -Continue Levsin 0.125 mg sublingual 3 times daily as needed cramping/pain. -Continue Zosyn.  Holding on colonoscopy for now but will consider high risk if she stagnates -We are going to recheck some labs today including a CBC, CMP, amylase, lipase, and lactic acid. -Will repeat another abdominal x-ray. -Minimize narcotics as able. -If no significant improvement tomorrow then may plan to repeat another CT scan.  **Spoke with the son, [name], on the phone and gave him an update.  All questions answered.   LOS: 3 days   Laban Emperor. Ahava Kissoon  05/29/2022, 1:39 PM

## 2022-05-29 NOTE — Progress Notes (Signed)
Dr. Avon Gully notified about latic acid = 2.0. MD aware.   Lavenia Atlas, RN

## 2022-05-29 NOTE — Progress Notes (Signed)
PROGRESS NOTE    [name]  ZOX:[id number] DOB: 05/14/1951 DOA: 05/26/2022 PCP: Silverio Decamp, MD   Brief Narrative:  71 year old with history of hypertension, hyperlipidemia, chronic A-fib on Eliquis status post ablation, hypothyroidism GERD and anxiety presented to the ER with low hemoglobin.  Patient recently suffering from multiple abdominal symptoms ongoing nausea vomiting poor appetite and diarrhea since 7/9.  7/9-7/17, admitted to [facility] with nausea vomiting and diarrhea, CT scan consistent with right-sided colitis and pneumatosis coli in the proximal hepatic flexure.  Initially treated with IV antibiotics, changed to Augmentin and discharged home where she completed her antibiotic course.    7/24 - (current admission) -noted to have bright red blood per rectum, instructed to discontinue Eliquis in the outpatient setting but with worsening symptoms of fatigue and weakness presented to the ED for symptoms noted to be profoundly anemic requiring transfusion, GI consulted.   Assessment & Plan:   Principal Problem:   Symptomatic anemia Active Problems:   GI bleed   AKI (acute kidney injury) (HCC)   History of colitis   Diarrhea   Paroxysmal atrial fibrillation (HCC)   Chronic anticoagulation   Benign essential hypertension   Hypothyroidism   GERD (gastroesophageal reflux disease)   Morbid obesity (HCC)  Acute symptomatic blood loss anemia, POA Secondary to acute GI bleed, suspected diverticular versus ischemic colitis -Hemoglobin 6.6 at intake status post 2 unit PRBC with no further bleeding; hemoglobin generally stable -GI following, appreciate insight recommendations, currently treating medically, will likely transition outpatient for further endoscopy findings given high risk given questionable pneumatosis on previous imaging  Acute kidney injury, severe hypokalemia, resolved Volume overload, likely iatrogenic DC IV fluids - resume home  diuretics, IV lasix x1 Advancing diet as above per GI   Paroxysmal atrial fibrillation: Currently in sinus rhythm, Eliquis on hold given above GI bleed  Likely resume Eliquis in the next 24 to 48 hours given stable hemoglobin at the time of discharge, currently holding given acute bleed as above and possible need for procedure   Essential hypertension:  Continue metoprolol, irbesartan Diuretics remain on hold   Hypothyroidism:  Euthyroid on Synthroid replacement.  DVT prophylaxis: Early ambulation, SCDs in the setting of acute bleed Code Status: Full Family Communication: None present  Status is: Inpatient  Dispo: The patient is from: Home              Anticipated d/c is to: Home              Anticipated d/c date is: 48 to 72 hours pending clinical course and need for further procedure              Patient currently not medically stable for discharge  Consultants:  GI  Procedures:  None  Antimicrobials:  Zosyn  Subjective: No acute issues or events overnight tolerating p.o. somewhat poorly this morning otherwise denies intractable nausea vomiting diarrhea.  Denies any further bright red blood per rectum or dark tarry stools.  Objective: Vitals:   05/28/22 1524 05/28/22 2042 05/28/22 2355 05/29/22 0351  BP: (!) 125/57 114/65 (!) 122/51 122/66  Pulse: (!) 57 (!) 50 (!) 57 63  Resp: '15 20 18 20  '$ Temp: 98.2 F (36.8 C) 98.1 F (36.7 C) 97.9 F (36.6 C) (!) 97.5 F (36.4 C)  TempSrc: Oral Oral Oral Oral  SpO2: 90% 95% 94% 98%  Weight:      Height:        Intake/Output Summary (Last 24 hours)  at 05/29/2022 0800 Last data filed at 05/29/2022 0534 Gross per 24 hour  Intake 2700.8 ml  Output 2 ml  Net 2698.8 ml    Filed Weights   05/26/22 1043  Weight: 111.2 kg    Examination:  General exam: Appears calm and comfortable  Respiratory system: Clear to auscultation. Respiratory effort normal. Cardiovascular system: S1 & S2 heard, RRR. No JVD, murmurs, rubs,  gallops or clicks. No pedal edema. Gastrointestinal system: Abdomen is nondistended, soft and nontender. No organomegaly or masses felt. Normal bowel sounds heard. Central nervous system: Alert and oriented. No focal neurological deficits. Extremities: Symmetric 5 x 5 power. Skin: No rashes, lesions or ulcers Psychiatry: Judgement and insight appear normal. Mood & affect appropriate.     Data Reviewed: I have personally reviewed following labs and imaging studies  CBC: Recent Labs  Lab 05/25/22 0000 05/26/22 0520 05/26/22 1845 05/27/22 0122 05/27/22 1755 05/28/22 0535 05/28/22 1752 05/29/22 0342  WBC 5.2 5.3  --  3.7*  --   --   --   --   NEUTROABS 3,489 3.6  --   --   --   --   --   --   HGB 6.0* 6.6*   < > 7.4* 8.9* 8.3* 8.6* 8.0*  HCT 18.6* 21.2*   < > 23.0* 28.0* 26.6* 27.8* 26.5*  MCV 100.5* 105.0*  --  94.7  --   --   --   --   PLT 217 244  --  165  --   --   --   --    < > = values in this interval not displayed.    Basic Metabolic Panel: Recent Labs  Lab 05/25/22 0000 05/26/22 0520 05/27/22 0122 05/28/22 0535  NA 142 139 141 144  K 3.6 3.1* 2.8* 4.2  CL 103 101 102 108  CO2 33* '30 28 30  '$ GLUCOSE 103* 136* 97 103*  BUN 9 9 6* <5*  CREATININE 0.85 1.18* 0.85 0.77  CALCIUM 8.4* 8.7* 8.5* 8.3*  MG  --   --  1.8  --   PHOS  --   --  3.7  --     GFR: Estimated Creatinine Clearance: 77.3 mL/min (by C-G formula based on SCr of 0.77 mg/dL). Liver Function Tests: Recent Labs  Lab 05/25/22 0000 05/26/22 0647  AST 21 40  ALT 15 28  ALKPHOS  --  71  BILITOT 0.6 0.5  PROT 4.8* 5.5*  ALBUMIN  --  2.8*    Recent Labs  Lab 05/25/22 0000  LIPASE 9  AMYLASE 19*    No results for input(s): "AMMONIA" in the last 168 hours. Coagulation Profile: Recent Labs  Lab 05/26/22 0520  INR 1.1    Cardiac Enzymes: No results for input(s): "CKTOTAL", "CKMB", "CKMBINDEX", "TROPONINI" in the last 168 hours. BNP (last 3 results) No results for input(s): "PROBNP"  in the last 8760 hours. HbA1C: No results for input(s): "HGBA1C" in the last 72 hours. CBG: No results for input(s): "GLUCAP" in the last 168 hours. Lipid Profile: No results for input(s): "CHOL", "HDL", "LDLCALC", "TRIG", "CHOLHDL", "LDLDIRECT" in the last 72 hours. Thyroid Function Tests: No results for input(s): "TSH", "T4TOTAL", "FREET4", "T3FREE", "THYROIDAB" in the last 72 hours. Anemia Panel: No results for input(s): "VITAMINB12", "FOLATE", "FERRITIN", "TIBC", "IRON", "RETICCTPCT" in the last 72 hours. Sepsis Labs: No results for input(s): "PROCALCITON", "LATICACIDVEN" in the last 168 hours.  No results found for this or any previous visit (from the past 240  hour(s)).       Radiology Studies: DG Abd 2 Views  Result Date: 05/28/2022 CLINICAL DATA:  [id number] abdominal pain and blood in the stool EXAM: ABDOMEN - 2 VIEW COMPARISON:  None Available. FINDINGS: The bowel gas pattern is normal. There is no evidence of free air. No significant stool burden in the colon. No radio-opaque calculi or other significant radiographic abnormality is seen. Bilateral small pleural effusion. IMPRESSION: Bowel-gas pattern is nonobstructive.  Bilateral pleural effusion. Electronically Signed   By: Frazier Richards M.D.   On: 05/28/2022 13:44        Scheduled Meds:  flecainide  50 mg Oral Q12H   gabapentin  1,200 mg Oral QHS   icosapent Ethyl  2 g Oral BID   irbesartan  300 mg Oral Daily   levothyroxine  75 mcg Oral Q breakfast   metoprolol succinate  50 mg Oral Daily   pantoprazole  40 mg Oral Daily   ramelteon  8 mg Oral QHS   sodium chloride flush  3 mL Intravenous Q12H   traZODone  100 mg Oral QHS   venlafaxine XR  225 mg Oral Daily   Continuous Infusions:  sodium chloride     sodium chloride 100 mL/hr at 05/29/22 0534   piperacillin-tazobactam (ZOSYN)  IV 3.375 g (05/29/22 0534)     LOS: 3 days   Time spent: 64mn  Latoshia Monrroy C Shailyn Weyandt, DO [facility]  If 7PM-7AM, please  contact night-coverage [email or web address]  05/29/2022, 8:00 AM

## 2022-05-30 ENCOUNTER — Inpatient Hospital Stay (HOSPITAL_COMMUNITY): Payer: Medicare Other

## 2022-05-30 DIAGNOSIS — Z8719 Personal history of other diseases of the digestive system: Secondary | ICD-10-CM | POA: Diagnosis not present

## 2022-05-30 DIAGNOSIS — Z7901 Long term (current) use of anticoagulants: Secondary | ICD-10-CM | POA: Diagnosis not present

## 2022-05-30 DIAGNOSIS — D649 Anemia, unspecified: Secondary | ICD-10-CM | POA: Diagnosis not present

## 2022-05-30 DIAGNOSIS — R933 Abnormal findings on diagnostic imaging of other parts of digestive tract: Secondary | ICD-10-CM | POA: Diagnosis not present

## 2022-05-30 DIAGNOSIS — R195 Other fecal abnormalities: Secondary | ICD-10-CM | POA: Diagnosis not present

## 2022-05-30 LAB — TYPE AND SCREEN
ABO/RH(D): A NEG
Antibody Screen: NEGATIVE
Unit division: 0
Unit division: 0
Unit division: 0
Unit division: 0

## 2022-05-30 LAB — HEMOGLOBIN AND HEMATOCRIT, BLOOD
HCT: 29.8 % — ABNORMAL LOW (ref 36.0–46.0)
HCT: 30.5 % — ABNORMAL LOW (ref 36.0–46.0)
Hemoglobin: 9 g/dL — ABNORMAL LOW (ref 12.0–15.0)
Hemoglobin: 9.2 g/dL — ABNORMAL LOW (ref 12.0–15.0)

## 2022-05-30 LAB — BPAM RBC
Blood Product Expiration Date: 202308122359
Blood Product Expiration Date: 202308182359
Blood Product Expiration Date: 202308192359
Blood Product Expiration Date: 202308212359
ISSUE DATE / TIME: 202307270737
ISSUE DATE / TIME: 202307271130
Unit Type and Rh: 600
Unit Type and Rh: 600
Unit Type and Rh: 600
Unit Type and Rh: 600

## 2022-05-30 LAB — BASIC METABOLIC PANEL
Anion gap: 7 (ref 5–15)
BUN: 9 mg/dL (ref 8–23)
CO2: 27 mmol/L (ref 22–32)
Calcium: 8.3 mg/dL — ABNORMAL LOW (ref 8.9–10.3)
Chloride: 107 mmol/L (ref 98–111)
Creatinine, Ser: 0.73 mg/dL (ref 0.44–1.00)
GFR, Estimated: >60 mL/min (ref >60)
Glucose, Bld: 98 mg/dL (ref 70–99)
Potassium: 3.7 mmol/L (ref 3.5–5.1)
Sodium: 141 mmol/L (ref 135–145)

## 2022-05-30 MED ORDER — IOHEXOL 300 MG/ML  SOLN
100.0000 mL | Freq: Once | INTRAMUSCULAR | Status: AC | PRN
  Administered 2022-05-30: 100 mL via INTRAVENOUS

## 2022-05-30 NOTE — Progress Notes (Signed)
PROGRESS NOTE    [name]  EXH:[id number] DOB: 09/05/1951 DOA: 05/26/2022 PCP: Silverio Decamp, MD   Brief Narrative:  71 year old with history of hypertension, hyperlipidemia, chronic A-fib on Eliquis status post ablation, hypothyroidism GERD and anxiety presented to the ER with low hemoglobin.  Patient recently suffering from multiple abdominal symptoms ongoing nausea vomiting poor appetite and diarrhea since 7/9.  7/9-7/17, admitted to [facility] with nausea vomiting and diarrhea, CT scan consistent with right-sided colitis and pneumatosis coli in the proximal hepatic flexure.  Initially treated with IV antibiotics, changed to Augmentin and discharged home where she completed her antibiotic course.    7/24 - (current admission) -noted to have bright red blood per rectum, instructed to discontinue Eliquis in the outpatient setting but with worsening symptoms of fatigue and weakness presented to the ED for symptoms noted to be profoundly anemic requiring transfusion, GI consulted. 7/28-31 - minimal improvement - remains high risk for colonoscopy given ? pneumatosis on previous imaging. Repeat imaging per GI - CT abd/pelvis pending this am.  Assessment & Plan:   Principal Problem:   Symptomatic anemia Active Problems:   GI bleed   AKI (acute kidney injury) ([place])   History of colitis   Diarrhea   Paroxysmal atrial fibrillation (HCC)   Chronic anticoagulation   Benign essential hypertension   Hypothyroidism   GERD (gastroesophageal reflux disease)   Morbid obesity (HCC)  Acute symptomatic blood loss anemia, POA Secondary to acute GI bleed, suspected diverticular versus ischemic colitis -Hemoglobin 6.6 at intake status post 2 unit PRBC with no further bleeding; hemoglobin generally stable -GI following, appreciate insight recommendations, currently treating medically, will likely transition outpatient for further endoscopy findings given high risk given  questionable pneumatosis on previous imaging -Repeat CT abd/pelvis pending  Acute kidney injury, severe hypokalemia, resolved Volume overload, likely iatrogenic DC IV fluids - resume home diuretics Advancing diet as above per GI   Paroxysmal atrial fibrillation: Currently in sinus rhythm, Eliquis on hold given above GI bleed  Likely resume Eliquis in the next 24 to 48 hours given stable hemoglobin at the time of discharge, currently holding given acute bleed as above and possible need for procedure   Essential hypertension:  Continue metoprolol, irbesartan Diuretics remain on hold   Hypothyroidism:  Euthyroid on Synthroid replacement.  DVT prophylaxis: Early ambulation, SCDs in the setting of acute bleed Code Status: Full Family Communication: None present  Status is: Inpatient  Dispo: The patient is from: Home              Anticipated d/c is to: Home              Anticipated d/c date is: 48 to 72 hours pending clinical course and need for further procedure              Patient currently not medically stable for discharge  Consultants:  GI  Procedures:  None  Antimicrobials:  Zosyn  Subjective: No acute issues or events overnight - minimally improving but still no appetite, abdominal pain ongoing but not worse. No black/red stool noted.  Objective: Vitals:   05/29/22 1701 05/29/22 1945 05/29/22 2338 05/30/22 0424  BP: (!) 122/52 (!) 142/80 (!) 166/83 (!) 140/58  Pulse: (!) 55 61 82 63  Resp: 19 (!) '24 19 19  '$ Temp: 97.8 F (36.6 C) 98 F (36.7 C) 98 F (36.7 C) 97.9 F (36.6 C)  TempSrc: Oral Oral Oral Oral  SpO2: 90% 97% 98% 95%  Weight:  Height:        Intake/Output Summary (Last 24 hours) at 05/30/2022 0759 Last data filed at 05/29/2022 1509 Gross per 24 hour  Intake 290 ml  Output 2 ml  Net 288 ml    Filed Weights   05/26/22 1043  Weight: 111.2 kg    Examination:  General exam: Appears calm and comfortable  Respiratory system: Clear to  auscultation. Respiratory effort normal. Cardiovascular system: S1 & S2 heard, RRR. No JVD, murmurs, rubs, gallops or clicks. No pedal edema. Gastrointestinal system: Abdomen is nondistended, soft and nontender. No organomegaly or masses felt. Normal bowel sounds heard. Central nervous system: Alert and oriented. No focal neurological deficits. Extremities: Symmetric 5 x 5 power. Skin: No rashes, lesions or ulcers Psychiatry: Judgement and insight appear normal. Mood & affect appropriate.     Data Reviewed: I have personally reviewed following labs and imaging studies  CBC: Recent Labs  Lab 05/25/22 0000 05/26/22 0520 05/26/22 1845 05/27/22 0122 05/27/22 1755 05/28/22 0535 05/28/22 1752 05/29/22 0342 05/29/22 1634 05/30/22 0600  WBC 5.2 5.3  --  3.7*  --   --   --   --  4.7  --   NEUTROABS 3,489 3.6  --   --   --   --   --   --   --   --   HGB 6.0* 6.6*   < > 7.4*   < > 8.3* 8.6* 8.0* 8.7* 9.0*  HCT 18.6* 21.2*   < > 23.0*   < > 26.6* 27.8* 26.5* 27.8* 29.8*  MCV 100.5* 105.0*  --  94.7  --   --   --   --  98.9  --   PLT 217 244  --  165  --   --   --   --  165  --    < > = values in this interval not displayed.    Basic Metabolic Panel: Recent Labs  Lab 05/26/22 0520 05/27/22 0122 05/28/22 0535 05/29/22 1634 05/30/22 0600  NA 139 141 144 139 141  K 3.1* 2.8* 4.2 4.0 3.7  CL 101 102 108 106 107  CO2 '30 28 30 27 27  '$ GLUCOSE 136* 97 103* 149* 98  BUN 9 6* <5* 8 9  CREATININE 1.18* 0.85 0.77 0.85 0.73  CALCIUM 8.7* 8.5* 8.3* 8.1* 8.3*  MG  --  1.8  --   --   --   PHOS  --  3.7  --   --   --     GFR: Estimated Creatinine Clearance: 77.3 mL/min (by C-G formula based on SCr of 0.73 mg/dL). Liver Function Tests: Recent Labs  Lab 05/25/22 0000 05/26/22 0647 05/29/22 1634  AST 21 40 40  ALT '15 28 27  '$ ALKPHOS  --  71 78  BILITOT 0.6 0.5 0.9  PROT 4.8* 5.5* 5.5*  ALBUMIN  --  2.8* 2.8*    Recent Labs  Lab 05/25/22 0000 05/29/22 1634  LIPASE 9 25   AMYLASE 19* 18*    No results for input(s): "AMMONIA" in the last 168 hours. Coagulation Profile: Recent Labs  Lab 05/26/22 0520  INR 1.1    Cardiac Enzymes: No results for input(s): "CKTOTAL", "CKMB", "CKMBINDEX", "TROPONINI" in the last 168 hours. BNP (last 3 results) No results for input(s): "PROBNP" in the last 8760 hours. HbA1C: No results for input(s): "HGBA1C" in the last 72 hours. CBG: No results for input(s): "GLUCAP" in the last 168 hours. Lipid Profile: No results for  input(s): "CHOL", "HDL", "LDLCALC", "TRIG", "CHOLHDL", "LDLDIRECT" in the last 72 hours. Thyroid Function Tests: No results for input(s): "TSH", "T4TOTAL", "FREET4", "T3FREE", "THYROIDAB" in the last 72 hours. Anemia Panel: No results for input(s): "VITAMINB12", "FOLATE", "FERRITIN", "TIBC", "IRON", "RETICCTPCT" in the last 72 hours. Sepsis Labs: Recent Labs  Lab 05/29/22 1634  LATICACIDVEN 2.0*    No results found for this or any previous visit (from the past 240 hour(s)).       Radiology Studies: DG Abd 2 Views  Result Date: 05/29/2022 CLINICAL DATA:  Abdominal distention and abdominal pain. EXAM: ABDOMEN - 2 VIEW COMPARISON:  Abdominal radiograph 05/28/2022. FINDINGS: The bowel gas pattern is normal. There is no evidence of free air given the limits of the supine images. No radio-opaque calculi or other significant radiographic abnormality is seen in the abdomen. Surgical tacks noted in the lower pelvis bilaterally. Bilateral small pleural effusions with associated atelectasis. IMPRESSION: Nonobstructive bowel gas pattern. Persistent small bilateral pleural effusions with bibasilar subsegmental atelectasis. Electronically Signed   By: Ileana Roup M.D.   On: 05/29/2022 17:52   DG Abd 2 Views  Result Date: 05/28/2022 CLINICAL DATA:  [id number] abdominal pain and blood in the stool EXAM: ABDOMEN - 2 VIEW COMPARISON:  None Available. FINDINGS: The bowel gas pattern is normal. There is no evidence of  free air. No significant stool burden in the colon. No radio-opaque calculi or other significant radiographic abnormality is seen. Bilateral small pleural effusion. IMPRESSION: Bowel-gas pattern is nonobstructive.  Bilateral pleural effusion. Electronically Signed   By: Frazier Richards M.D.   On: 05/28/2022 13:44        Scheduled Meds:  flecainide  50 mg Oral Q12H   furosemide  10 mg Oral BID   gabapentin  1,200 mg Oral QHS   icosapent Ethyl  2 g Oral BID   irbesartan  300 mg Oral Daily   levothyroxine  75 mcg Oral Q breakfast   metoprolol succinate  50 mg Oral Daily   pantoprazole  40 mg Oral Daily   ramelteon  8 mg Oral QHS   sodium chloride flush  3 mL Intravenous Q12H   traZODone  100 mg Oral QHS   venlafaxine XR  225 mg Oral Daily   Continuous Infusions:  sodium chloride     piperacillin-tazobactam (ZOSYN)  IV 3.375 g (05/30/22 0554)     LOS: 4 days   Time spent: 63mn  Reegan Mctighe C Oaklan Persons, DO [facility]  If 7PM-7AM, please contact night-coverage [email or web address]  05/30/2022, 7:59 AM

## 2022-05-30 NOTE — Evaluation (Signed)
Occupational Therapy Evaluation Patient Details Name: [name] MRN: [id number] DOB: 1951-04-23 Today's Date: 05/30/2022   History of Present Illness 71 yo female with onset of abd pain, N/V/D. and rectal bleeding was admitted on 7/27, MD concerned for diverticulitis vs mass. has pleural effusion and atelectasis, Hgb low with 2 units PRBC's transfused.  Holding her Eliquis.  PMHx:  hypertension, hyperlipidemia, chronic A-fib on Eliquis status post ablation, hypothyroidism GERD and anxiety   Clinical Impression   Pt PTA: Pt living with children; pt will go home with son from d/c, but plans to live with daughter in a week. (Son's house is split level and pt has 8 steps to enter living area then its all on 1L. Daughter's home is 1L). Pt was independent with ADL and assisted with iADL tasks; independent for mobility with no AD, but has rollator for mobility. Pt currently, set-upA to minA overall for OOB ADL tasks. Pt requires 2L O2 and desats into 80s on RA with 2 mins of exertion. Pt mobilizing without an AD with minguardA overall.  Pt fatigues very quickly and edema present in BLEs. Pt would greatly benefit from continued OT skilled services. OT following acutely.     Recommendations for follow up therapy are one component of a multi-disciplinary discharge planning process, led by the attending physician.  Recommendations may be updated based on patient status, additional functional criteria and insurance authorization.   Follow Up Recommendations  Home health OT    Assistance Recommended at Discharge Set up Supervision/Assistance  Patient can return home with the following A little help with walking and/or transfers;A little help with bathing/dressing/bathroom;Assistance with cooking/housework;Direct supervision/assist for medications management;Direct supervision/assist for financial management;Assist for transportation;Help with stairs or ramp for entrance    Functional Status Assessment   Patient has had a recent decline in their functional status and demonstrates the ability to make significant improvements in function in a reasonable and predictable amount of time.  Equipment Recommendations  BSC/3in1    Recommendations for Other Services       Precautions / Restrictions Precautions Precautions: Fall;Other (comment) Precaution Comments: needs continouous O2 Restrictions Weight Bearing Restrictions: No      Mobility Bed Mobility Overal bed mobility: Needs Assistance Bed Mobility: Supine to Sit     Supine to sit: Supervision     General bed mobility comments: no physical assist; line assist only    Transfers Overall transfer level: Needs assistance Equipment used: None Transfers: Sit to/from Stand Sit to Stand: Min guard           General transfer comment: no AD      Balance Overall balance assessment: Needs assistance Sitting-balance support: Bilateral upper extremity supported, Feet supported Sitting balance-Leahy Scale: Fair     Standing balance support: During functional activity, No upper extremity supported Standing balance-Leahy Scale: Fair                             ADL either performed or assessed with clinical judgement   ADL Overall ADL's : Needs assistance/impaired Eating/Feeding: Set up;Sitting   Grooming: Min guard;Standing Grooming Details (indicate cue type and reason): briefly for x1 minute Upper Body Bathing: Set up;Sitting   Lower Body Bathing: Minimal assistance;Cueing for safety;Cueing for sequencing;Sitting/lateral leans;Sit to/from stand Lower Body Bathing Details (indicate cue type and reason): needs education on AE Upper Body Dressing : Set up;Sitting   Lower Body Dressing: Minimal assistance;Sitting/lateral leans;Sit to/from stand;Cueing for safety  Toilet Transfer: Min guard;Cueing for safety;Ambulation   Toileting- Clothing Manipulation and Hygiene: Supervision/safety;Cueing for  safety;Sitting/lateral lean;Sit to/from stand Toileting - Clothing Manipulation Details (indicate cue type and reason): reports that she has a toilet aide     Functional mobility during ADLs: Min guard;Cueing for safety General ADL Comments: Pt set-upA to minA overall for OOB ADL tasks. Pt requires 2L O2 and desats into 80s on RA with 2 mins of exertion. Pt mobilizing without an AD qwith minguardA overall.     Vision Baseline Vision/History: 1 Wears glasses Ability to See in Adequate Light: 0 Adequate Patient Visual Report: No change from baseline Vision Assessment?: No apparent visual deficits     Perception     Praxis      Pertinent Vitals/Pain Pain Assessment Pain Assessment: No/denies pain     Hand Dominance Right   Extremity/Trunk Assessment Upper Extremity Assessment Upper Extremity Assessment: Overall WFL for tasks assessed   Lower Extremity Assessment Lower Extremity Assessment: Generalized weakness   Cervical / Trunk Assessment Cervical / Trunk Assessment: Other exceptions Cervical / Trunk Exceptions: large body habitus   Communication Communication Communication: No difficulties   Cognition Arousal/Alertness: Awake/alert Behavior During Therapy: WFL for tasks assessed/performed Overall Cognitive Status: Within Functional Limits for tasks assessed                                       General Comments  Pt desats to 80s of O2 RA requiring >30 secs to recover on 2L. Pt fatigues very quickly and edema present in BLEs.    Exercises     Shoulder Instructions      Home Living Family/patient expects to be discharged to:: Private residence Living Arrangements: Children Available Help at Discharge: Family;Available PRN/intermittently Type of Home: House Home Access: Level entry     Home Layout: Multi-level Alternate Level Stairs-Number of Steps: 8 steps (spli tlevel home) Alternate Level Stairs-Rails: Right Bathroom Shower/Tub: Tub/shower  unit   Bathroom Toilet: Handicapped height     Home Equipment: Rollator (4 wheels);Shower seat   Additional Comments: Pt's children both work. Pt plans to stay with son for about a week until her daughter has healed from her sx and moved her daughter into college. Daughter's home is 1L and 1 STE.      Prior Functioning/Environment Prior Level of Function : Independent/Modified Independent             Mobility Comments: furniture walks or no AD; rollator for community outings ADLs Comments: Pt independent. Assist with iADL.        OT Problem List: Decreased strength;Decreased activity tolerance;Impaired balance (sitting and/or standing);Decreased safety awareness;Increased edema;Cardiopulmonary status limiting activity;Obesity      OT Treatment/Interventions: Self-care/ADL training;Therapeutic exercise;Energy conservation;DME and/or AE instruction;Therapeutic activities;Patient/family education;Balance training    OT Goals(Current goals can be found in the care plan section) Acute Rehab OT Goals Patient Stated Goal: to go home OT Goal Formulation: With patient Time For Goal Achievement: 06/13/22 Potential to Achieve Goals: Good ADL Goals Pt Will Perform Lower Body Dressing: with set-up;with adaptive equipment;sit to/from stand;sitting/lateral leans Pt Will Transfer to Toilet: ambulating;with modified independence;regular height toilet Additional ADL Goal #1: Pt will increase to x10 mins of OOB ADL tasks with O2 >90%. Additional ADL Goal #2: Pt will state 3 energy conservation techniques for OOB ADL tasks or mobility.  OT Frequency: Min 2X/week    Co-evaluation  AM-PAC OT "6 Clicks" Daily Activity     Outcome Measure Help from another person eating meals?: None Help from another person taking care of personal grooming?: A Little Help from another person toileting, which includes using toliet, bedpan, or urinal?: A Little Help from another person bathing  (including washing, rinsing, drying)?: A Little Help from another person to put on and taking off regular upper body clothing?: None Help from another person to put on and taking off regular lower body clothing?: A Little 6 Click Score: 20   End of Session Equipment Utilized During Treatment: Oxygen Nurse Communication: Mobility status  Activity Tolerance: Patient tolerated treatment well;Patient limited by fatigue Patient left: in chair;with call bell/phone within reach;with family/visitor present  OT Visit Diagnosis: Unsteadiness on feet (R26.81);Muscle weakness (generalized) (M62.81)                Time: 8270-7867 OT Time Calculation (min): 55 min Charges:  OT General Charges $OT Visit: 1 Visit OT Evaluation $OT Eval Moderate Complexity: 1 Mod OT Treatments $Self Care/Home Management : 23-37 mins $Therapeutic Activity: 8-22 mins Jefferey Pica, OTR/L Acute Rehabilitation Services Office: [phone]   [id number] C 05/30/2022, 2:42 PM

## 2022-05-30 NOTE — Progress Notes (Signed)
Daily Rounding Note  05/30/2022, 11:16 AM  LOS: 4 days   SUBJECTIVE:   Chief complaint:    Abdominal pain, rectal bleeding.  Concern for diverticulitis versus mass like lesion. Mid to lower abdominal pain is not better.  Tolerating clears but not consuming much.  She is not that hungry.  Stools continue loose.  Clinically she feels less malaise.  OBJECTIVE:         Vital signs in last 24 hours:    Temp:  [97.5 F (36.4 C)-98.3 F (36.8 C)] 98.3 F (36.8 C) (07/31 0838) Pulse Rate:  [55-82] 65 (07/31 0838) Resp:  [18-24] 18 (07/31 0838) BP: (122-166)/(52-83) 144/67 (07/31 0838) SpO2:  [90 %-98 %] 90 % (07/31 0838) Last BM Date : 05/28/22 Filed Weights   05/26/22 1043  Weight: 111.2 kg   General: Nontoxic-appearing Heart: RRR.  NSR at 57 on telemetry monitor Chest: No labored breathing or cough Abdomen: soft, obese, BS active.  Tender w/O guard/rebound in med and low abd bil.    Extremities: no CCE Neuro/Psych:  alert, appropriate, fully  oriented.  No tremors or gross deficits.  Intake/Output from previous day: 07/30 0701 - 07/31 0700 In: 290 [P.O.:240; IV Piggyback:50] Out: 2 [Urine:1; Stool:1]  Intake/Output this shift: No intake/output data recorded.  Lab Results: Recent Labs    05/29/22 0342 05/29/22 1634 05/30/22 0600  WBC  --  4.7  --   HGB 8.0* 8.7* 9.0*  HCT 26.5* 27.8* 29.8*  PLT  --  165  --    BMET Recent Labs    05/28/22 0535 05/29/22 1634 05/30/22 0600  NA 144 139 141  K 4.2 4.0 3.7  CL 108 106 107  CO2 '30 27 27  '$ GLUCOSE 103* 149* 98  BUN <5* 8 9  CREATININE 0.77 0.85 0.73  CALCIUM 8.3* 8.1* 8.3*   LFT Recent Labs    05/29/22 1634  PROT 5.5*  ALBUMIN 2.8*  AST 40  ALT 27  ALKPHOS 78  BILITOT 0.9   PT/INR No results for input(s): "LABPROT", "INR" in the last 72 hours. Hepatitis Panel No results for input(s): "HEPBSAG", "HCVAB", "HEPAIGM", "HEPBIGM" in the last 72  hours.  Studies/Results: DG Abd 2 Views  Result Date: 05/29/2022 CLINICAL DATA:  Abdominal distention and abdominal pain. EXAM: ABDOMEN - 2 VIEW COMPARISON:  Abdominal radiograph 05/28/2022. FINDINGS: The bowel gas pattern is normal. There is no evidence of free air given the limits of the supine images. No radio-opaque calculi or other significant radiographic abnormality is seen in the abdomen. Surgical tacks noted in the lower pelvis bilaterally. Bilateral small pleural effusions with associated atelectasis. IMPRESSION: Nonobstructive bowel gas pattern. Persistent small bilateral pleural effusions with bibasilar subsegmental atelectasis. Electronically Signed   By: Ileana Roup M.D.   On: 05/29/2022 17:52   DG Abd 2 Views  Result Date: 05/28/2022 CLINICAL DATA:  [id number] abdominal pain and blood in the stool EXAM: ABDOMEN - 2 VIEW COMPARISON:  None Available. FINDINGS: The bowel gas pattern is normal. There is no evidence of free air. No significant stool burden in the colon. No radio-opaque calculi or other significant radiographic abnormality is seen. Bilateral small pleural effusion. IMPRESSION: Bowel-gas pattern is nonobstructive.  Bilateral pleural effusion. Electronically Signed   By: Frazier Richards M.D.   On: 05/28/2022 13:44    Scheduled Meds:  flecainide  50 mg Oral Q12H   furosemide  10 mg Oral BID   gabapentin  1,200  mg Oral QHS   icosapent Ethyl  2 g Oral BID   irbesartan  300 mg Oral Daily   levothyroxine  75 mcg Oral Q breakfast   metoprolol succinate  50 mg Oral Daily   pantoprazole  40 mg Oral Daily   ramelteon  8 mg Oral QHS   sodium chloride flush  3 mL Intravenous Q12H   traZODone  100 mg Oral QHS   venlafaxine XR  225 mg Oral Daily   Continuous Infusions:  sodium chloride     piperacillin-tazobactam (ZOSYN)  IV 3.375 g (05/30/22 0554)   PRN Meds:.acetaminophen **OR** acetaminophen, albuterol, hyoscyamine, ondansetron **OR** ondansetron (ZOFRAN) IV,  oxyCODONE-acetaminophen, polyethylene glycol     ASSESMENT:   Abdominal pain and rectal bleeding.  Recent antibiotics to address pneumatosis, right sided colitis per [facility] at [facility] in [place].  Discharged 7/17 on 5 d Augmentin .  Now suspicious for right sided diverticulitis vs mass lesion.  Last colonoscopy was March 2010, this was normal.  Did not have suggested 5-year follow-up as there is a family history of paternal colon cancer.  Cologuard + 2022, again colonoscopy not pursued as she developed COVID infection.. Now day 3 Zosyn.  Abdominal pain not improved.  Chronic Eliquis for A-fib.  03/2019 ablation.  Med on hold.     Blood loss anemia.  Hgb 6... 2 PRBCs.. 9.  Was 8.5 -9.8,3 weeks ago.     PLAN   Repeat CTAP with contrast ordered.  Have made her n.p.o. for this.  Can resume diet after CT completed, randomly ordered restart for 5 PM today    Azucena Freed  05/30/2022, 11:16 AM Phone [phone]

## 2022-05-30 NOTE — Evaluation (Signed)
Physical Therapy Evaluation Patient Details Name: [name] MRN: [id number] DOB: 12-12-1950 Today's Date: 05/30/2022  History of Present Illness  71 yo female with onset of abd pain, N/V/D. and rectal bleeding was admitted on 7/27, MD concerned for diverticulitis vs mass. has pleural effusion and atelectasis, Hgb low with 2 units PRBC's transfused.  Holding her Eliquis.  PMHx:  hypertension, hyperlipidemia, chronic A-fib on Eliquis status post ablation, hypothyroidism GERD and anxiety  Clinical Impression  Pt was seen for mobility just prior to having a scan for abd pain, and was fairly mobile given her symptoms with fatigue and edema.  Pt was assisted to BR and to walk in her room, with RW for support.  Pt is not very dependent on walker and in fact can maneuver a couple steps without it.  However, she is a bit weak and concern is there for fall risk.  Her plan is to go home with family support, and will require O2 currently during the day.  Follow acutely for goals of PT and work toward strengthening and increasing endurance to reduce her need for AD or O2 as her condition permits.  Follow up with all results of testing.      Recommendations for follow up therapy are one component of a multi-disciplinary discharge planning process, led by the attending physician.  Recommendations may be updated based on patient status, additional functional criteria and insurance authorization.  Follow Up Recommendations Home health PT      Assistance Recommended at Discharge Frequent or constant Supervision/Assistance  Patient can return home with the following  A little help with walking and/or transfers;A little help with bathing/dressing/bathroom;Assistance with cooking/housework;Direct supervision/assist for medications management;Assist for transportation;Help with stairs or ramp for entrance    Equipment Recommendations Rolling walker (2 wheels)  Recommendations for Other Services        Functional Status Assessment Patient has had a recent decline in their functional status and demonstrates the ability to make significant improvements in function in a reasonable and predictable amount of time.     Precautions / Restrictions Precautions Precautions: Fall;Other (comment) Precaution Comments: needs continuous O2 Restrictions Weight Bearing Restrictions: No      Mobility  Bed Mobility Overal bed mobility: Needs Assistance Bed Mobility: Supine to Sit, Sit to Supine     Supine to sit: Min guard Sit to supine: Min guard   General bed mobility comments: required minor help to get up to side of bed and requiring O2    Transfers Overall transfer level: Needs assistance Equipment used: Rolling walker (2 wheels) Transfers: Sit to/from Stand Sit to Stand: Supervision, Min guard                Ambulation/Gait Ambulation/Gait assistance: Min guard Gait Distance (Feet): 40 Feet Assistive device: Rolling walker (2 wheels) Gait Pattern/deviations: Step-through pattern, Decreased stride length, Wide base of support Gait velocity: reduced Gait velocity interpretation: <1.31 ft/sec, indicative of household ambulator Pre-gait activities: standing balance ck General Gait Details: pt is slow but steady on RW in room, requring some help with lines of telemetry and O2  Stairs            Wheelchair Mobility    Modified Rankin (Stroke Patients Only)       Balance Overall balance assessment: Needs assistance Sitting-balance support: Feet supported Sitting balance-Leahy Scale: Fair     Standing balance support: Bilateral upper extremity supported, During functional activity Standing balance-Leahy Scale: Poor  Pertinent Vitals/Pain Pain Assessment Pain Assessment: No/denies pain    Home Living Family/patient expects to be discharged to:: Private residence Living Arrangements: Children Available Help at  Discharge: Family;Available PRN/intermittently Type of Home: House Home Access: Level entry     Alternate Level Stairs-Number of Steps: 8 steps (spli tlevel home) Home Layout: Multi-level Home Equipment: Rollator (4 wheels);Shower seat Additional Comments: has family assist for home    Prior Function Prior Level of Function : Independent/Modified Independent             Mobility Comments: rollator at most for gait support ADLs Comments: Pt independent. Assist with iADL.     Hand Dominance   Dominant Hand: Right    Extremity/Trunk Assessment   Upper Extremity Assessment Upper Extremity Assessment: Defer to OT evaluation    Lower Extremity Assessment Lower Extremity Assessment: Generalized weakness    Cervical / Trunk Assessment Cervical / Trunk Assessment: Other exceptions Cervical / Trunk Exceptions: body habitus that makes OOB difficult, SOB with lying too flat  Communication   Communication: No difficulties  Cognition Arousal/Alertness: Awake/alert Behavior During Therapy: WFL for tasks assessed/performed Overall Cognitive Status: Within Functional Limits for tasks assessed                                          General Comments General comments (skin integrity, edema, etc.): off O2 initially and desaturated to 83%, replaced O2 and maintained sat to 90% or greater    Exercises     Assessment/Plan    PT Assessment Patient needs continued PT services  PT Problem List Decreased strength;Decreased activity tolerance;Decreased balance;Decreased coordination;Decreased safety awareness;Cardiopulmonary status limiting activity       PT Treatment Interventions DME instruction;Gait training;Stair training;Functional mobility training;Therapeutic activities;Therapeutic exercise;Balance training;Neuromuscular re-education;Patient/family education    PT Goals (Current goals can be found in the Care Plan section)  Acute Rehab PT Goals Patient  Stated Goal: to get home and feel better PT Goal Formulation: With patient Time For Goal Achievement: 06/13/22 Potential to Achieve Goals: Good    Frequency Min 3X/week     Co-evaluation               AM-PAC PT "6 Clicks" Mobility  Outcome Measure Help needed turning from your back to your side while in a flat bed without using bedrails?: A Little Help needed moving from lying on your back to sitting on the side of a flat bed without using bedrails?: A Little Help needed moving to and from a bed to a chair (including a wheelchair)?: A Little Help needed standing up from a chair using your arms (e.g., wheelchair or bedside chair)?: A Little Help needed to walk in hospital room?: A Little Help needed climbing 3-5 steps with a railing? : A Lot 6 Click Score: 17    End of Session Equipment Utilized During Treatment: Oxygen Activity Tolerance: Patient tolerated treatment well;Patient limited by fatigue Patient left: in bed;with call bell/phone within reach;with bed alarm set Nurse Communication: Mobility status;Other (comment) (requires fully upright posture due to breathing even on O2) PT Visit Diagnosis: Unsteadiness on feet (R26.81);Muscle weakness (generalized) (M62.81);Dizziness and giddiness (R42)    Time: 1194-1740 PT Time Calculation (min) (ACUTE ONLY): 17 min   Charges:   PT Evaluation $PT Eval Moderate Complexity: 1 Mod         [name] 05/30/2022, 5:14 PM  Mee Hives, PT  PhD Acute Rehab Dept. Number: [place]

## 2022-05-31 DIAGNOSIS — Z7901 Long term (current) use of anticoagulants: Secondary | ICD-10-CM | POA: Diagnosis not present

## 2022-05-31 DIAGNOSIS — D62 Acute posthemorrhagic anemia: Secondary | ICD-10-CM | POA: Diagnosis not present

## 2022-05-31 DIAGNOSIS — D649 Anemia, unspecified: Secondary | ICD-10-CM | POA: Diagnosis not present

## 2022-05-31 DIAGNOSIS — R933 Abnormal findings on diagnostic imaging of other parts of digestive tract: Secondary | ICD-10-CM | POA: Diagnosis not present

## 2022-05-31 DIAGNOSIS — Z8719 Personal history of other diseases of the digestive system: Secondary | ICD-10-CM | POA: Diagnosis not present

## 2022-05-31 LAB — BASIC METABOLIC PANEL
Anion gap: 8 (ref 5–15)
BUN: 7 mg/dL — ABNORMAL LOW (ref 8–23)
CO2: 26 mmol/L (ref 22–32)
Calcium: 8.5 mg/dL — ABNORMAL LOW (ref 8.9–10.3)
Chloride: 106 mmol/L (ref 98–111)
Creatinine, Ser: 0.87 mg/dL (ref 0.44–1.00)
GFR, Estimated: >60 mL/min (ref >60)
Glucose, Bld: 101 mg/dL — ABNORMAL HIGH (ref 70–99)
Potassium: 4 mmol/L (ref 3.5–5.1)
Sodium: 140 mmol/L (ref 135–145)

## 2022-05-31 LAB — HEMOGLOBIN AND HEMATOCRIT, BLOOD
HCT: 29.8 % — ABNORMAL LOW (ref 36.0–46.0)
Hemoglobin: 9.1 g/dL — ABNORMAL LOW (ref 12.0–15.0)

## 2022-05-31 MED ORDER — BISACODYL 5 MG PO TBEC: 10.0000 mg | DELAYED_RELEASE_TABLET | Freq: Once | ORAL | Status: DC

## 2022-05-31 MED ORDER — PEG-KCL-NACL-NASULF-NA ASC-C 100 G PO SOLR: 0.5000 | Freq: Once | ORAL | Status: DC

## 2022-05-31 MED ORDER — PEG-KCL-NACL-NASULF-NA ASC-C 100 G PO SOLR
0.5000 | Freq: Once | ORAL | Status: DC
  Filled 2022-05-31: qty 1

## 2022-05-31 NOTE — Progress Notes (Signed)
Daily Rounding Note  05/31/2022, 1:52 PM  LOS: 5 days   SUBJECTIVE:   Chief complaint: Abdominal pain, rectal bleeding.  Concern for diverticulitis versus mass in colon.  Pain persist in the lower abdomen, radiating to the umbilicus.  More tender on the right.  No nausea or vomiting.  Tolerating solid food but this triggers rapid stooling.  Has had about 3 bowel movements between yesterday and today.  OBJECTIVE:         Vital signs in last 24 hours:    Temp:  [97.4 F (36.3 C)-98 F (36.7 C)] 97.4 F (36.3 C) (08/01 1058) Pulse Rate:  [57-71] 71 (08/01 0734) Resp:  [18-20] 18 (08/01 1058) BP: (118-171)/(69-93) 155/70 (08/01 1058) SpO2:  [96 %-100 %] 98 % (08/01 1058) Last BM Date : 05/31/22 Filed Weights   05/26/22 1043  Weight: 111.2 kg   General: Anxious, nontoxic.  Obese. Heart: RRR. Chest: Clear bilaterally.  Slightly short of breath with activity as minimal as getting up from a lying to a seated position on the bed which was quite difficult for the patient to perform. Abdomen: Obese.  Soft.  Bowel sounds active.  Tenderness in the mid to right abdomen.  Much less tenderness on the left side.  No guarding or rebound. Extremities: No CCE. Neuro/Psych: Somewhat anxious, pleasant, cooperative.  Not confused.  No gross deficits or tremors.  Intake/Output from previous day: No intake/output data recorded.  Intake/Output this shift: Total I/O In: -  Out: 200 [Urine:200]  Lab Results: Recent Labs    05/29/22 1634 05/30/22 0600 05/30/22 1628 05/31/22 0605  WBC 4.7  --   --   --   HGB 8.7* 9.0* 9.2* 9.1*  HCT 27.8* 29.8* 30.5* 29.8*  PLT 165  --   --   --    BMET Recent Labs    05/29/22 1634 05/30/22 0600 05/31/22 0605  NA 139 141 140  K 4.0 3.7 4.0  CL 106 107 106  CO2 '27 27 26  '$ GLUCOSE 149* 98 101*  BUN 8 9 7*  CREATININE 0.85 0.73 0.87  CALCIUM 8.1* 8.3* 8.5*   LFT Recent Labs     05/29/22 1634  PROT 5.5*  ALBUMIN 2.8*  AST 40  ALT 27  ALKPHOS 78  BILITOT 0.9   PT/INR No results for input(s): "LABPROT", "INR" in the last 72 hours. Hepatitis Panel No results for input(s): "HEPBSAG", "HCVAB", "HEPAIGM", "HEPBIGM" in the last 72 hours.  Studies/Results: CT ABDOMEN PELVIS W CONTRAST  Result Date: 05/30/2022 CLINICAL DATA:  Diverticulitis. Complication suspected. Patient treated for presumed infectious colitis and pneumatosis earlier this month. Ongoing abdominal pain. Latest CT angio 05/26/2022 demonstrated no bleeding but there were diverticula with bowel wall thickening and pericolonic fat stranding at the ascending colon/hepatic flexure suspicious for developing or resolving diverticulitis. EXAM: CT ABDOMEN AND PELVIS WITH CONTRAST TECHNIQUE: Multidetector CT imaging of the abdomen and pelvis was performed using the standard protocol following bolus administration of intravenous contrast. RADIATION DOSE REDUCTION: This exam was performed according to the departmental dose-optimization program which includes automated exposure control, adjustment of the mA and/or kV according to patient size and/or use of iterative reconstruction technique. CONTRAST:  171m OMNIPAQUE IOHEXOL 300 MG/ML  SOLN COMPARISON:  KUB 05/29/2022; CTA abdomen and pelvis FINDINGS: Lower chest: There are partially visualized mild-to-moderate right-greater-than-left pleural effusions that are similar to prior. Curvilinear bilateral lower lung subsegmental atelectasis versus scarring. Heart size is again moderately enlarged.  Hepatobiliary: Smooth liver contours. Diffuse decreased density is again seen throughout the liver suggesting fatty infiltration. No focal liver mass is identified. The gallbladder is unremarkable. No intrahepatic or extrahepatic biliary ductal dilatation. Pancreas: No mass or inflammatory fat stranding. No pancreatic ductal dilatation is seen. Spleen: Normal in size without focal  abnormality. Adrenals/Urinary Tract: Adrenal glands are unremarkable. The kidneys enhance uniformly and are symmetric in size without hydronephrosis. No renal stone is seen. No renal mass is seen. No focal urinary bladder wall thickening. Stomach/Bowel: Mild to moderate sigmoid and mild descending colon diverticulosis without inflammatory changes. There is again mild inflammatory wall thickening of the ascending colon diffusely. This appears minimally worsened compared to recent 05/26/2022 CT. No perforation is seen. The terminal ileum is unremarkable. Normal appendix. No dilated loops of bowel to indicate bowel obstruction. Vascular/Lymphatic: No abdominal aortic aneurysm. Moderate atherosclerotic calcifications. No mesenteric, retroperitoneal, or pelvic lymphadenopathy. Reproductive: The uterus is present.  No gross adnexal abnormality. Other: Small fat containing umbilical hernia. Moderate edema throughout the deep aspect of the abdominal and proximal thigh subcutaneous fat diffusely, similar to prior. There are again hernia mesh coils within the anterior inferior pelvis.No abdominopelvic ascites. No pneumoperitoneum. Musculoskeletal: No acute or significant osseous findings. IMPRESSION: Compared to 05/26/2022: 1. Mild-to-moderate right-greater-than-left pleural effusions. 2. Unchanged to minimally worsened inflammatory wall thickening of the ascending colon diffusely. This is again suggestive of colitis. 3. Mild-to-moderate sigmoid and mild descending colon diverticulosis without inflammatory changes to indicate acute diverticulitis. Electronically Signed   By: Yvonne Kendall M.D.   On: 05/30/2022 17:27   DG Abd 2 Views  Result Date: 05/29/2022 CLINICAL DATA:  Abdominal distention and abdominal pain. EXAM: ABDOMEN - 2 VIEW COMPARISON:  Abdominal radiograph 05/28/2022. FINDINGS: The bowel gas pattern is normal. There is no evidence of free air given the limits of the supine images. No radio-opaque calculi or  other significant radiographic abnormality is seen in the abdomen. Surgical tacks noted in the lower pelvis bilaterally. Bilateral small pleural effusions with associated atelectasis. IMPRESSION: Nonobstructive bowel gas pattern. Persistent small bilateral pleural effusions with bibasilar subsegmental atelectasis. Electronically Signed   By: Ileana Roup M.D.   On: 05/29/2022 17:52    Scheduled Meds:  flecainide  50 mg Oral Q12H   furosemide  10 mg Oral BID   gabapentin  1,200 mg Oral QHS   icosapent Ethyl  2 g Oral BID   irbesartan  300 mg Oral Daily   levothyroxine  75 mcg Oral Q breakfast   metoprolol succinate  50 mg Oral Daily   pantoprazole  40 mg Oral Daily   ramelteon  8 mg Oral QHS   sodium chloride flush  3 mL Intravenous Q12H   traZODone  100 mg Oral QHS   venlafaxine XR  225 mg Oral Daily   Continuous Infusions:  sodium chloride     piperacillin-tazobactam (ZOSYN)  IV 3.375 g (05/31/22 1304)   PRN Meds:.acetaminophen **OR** acetaminophen, albuterol, hyoscyamine, ondansetron **OR** ondansetron (ZOFRAN) IV, oxyCODONE-acetaminophen, polyethylene glycol  ASSESMENT:   Abdominal pain, rectal bleeding.  Recent inpatient  w course of antibiotics for pneumatosis, right-sided colitis.  Completed course of Augmentin as outpatient.  Cologuard +2022.  Last colonoscopy 12/2008.  Day 4 Zosyn.  Today's follow-up CT scan shows minimal change so possible worsening in the inflammation in the ascending colon.  Although there is left-sided diverticulosis the, there is no evidence for diverticulitis.  Chronic Eliquis for A-fib.  Ablation 03/2019.  Eliquis is on hold.  Repeat CTAP  with contrast yesterday showed ongoing, possibly slightly worsened ascending colon thickening.  Diverticulosis wo diverticulitis in the sigmoid and descending colon.      Anemia.  Initially macrocytic, more recently normocytic.  Hgb 6.. 2 PRBCs.. 9.1 over 1 week and stable for the last 4 assays.   PLAN     Per Dr  Rush Landmark.  ? Time to pursue colonoscopy.      Azucena Freed  05/31/2022, 1:52 PM Phone [phone]

## 2022-05-31 NOTE — Progress Notes (Signed)
Physical Therapy Treatment Patient Details Name: [name] MRN: [id number] DOB: [birth year] Today's Date: 05/31/2022   History of Present Illness 71 yo female with onset of abd pain, N/V/D. and rectal bleeding was admitted on 7/27, MD concerned for diverticulitis vs mass. has pleural effusion and atelectasis, Hgb low with 2 units PRBC's transfused.  Holding her Eliquis.  PMHx:  hypertension, hyperlipidemia, chronic A-fib on Eliquis status post ablation, hypothyroidism GERD and anxiety    PT Comments    Pt received supine and agreeable to session with focus on progression of ambulation without AD. Pt able to demonstrate increased ambulation tolerance without AD with single UE support on IV pole without LOB and min guard for safety. Pt requiring x1 standing rest secondary to fatigue and SOB, however SpO2 remaining >90% throughout ambulation on RA. Replaced 2L at end of session per pt request for comfort. Pt continues to benefit from skilled PT services to progress toward functional mobility goals.    Recommendations for follow up therapy are one component of a multi-disciplinary discharge planning process, led by the attending physician.  Recommendations may be updated based on patient status, additional functional criteria and insurance authorization.  Follow Up Recommendations  Home health PT     Assistance Recommended at Discharge Frequent or constant Supervision/Assistance  Patient can return home with the following A little help with walking and/or transfers;A little help with bathing/dressing/bathroom;Assistance with cooking/housework;Direct supervision/assist for medications management;Assist for transportation;Help with stairs or ramp for entrance   Equipment Recommendations  Rolling walker (2 wheels)    Recommendations for Other Services       Precautions / Restrictions Precautions Precautions: Fall;Other (comment) Precaution Comments: needs continuous O2 Restrictions Weight  Bearing Restrictions: No     Mobility  Bed Mobility Overal bed mobility: Needs Assistance Bed Mobility: Supine to Sit     Supine to sit: Min guard     General bed mobility comments: min guard for safety seated EOB at end of session    Transfers Overall transfer level: Needs assistance Equipment used: Rolling walker (2 wheels) Transfers: Sit to/from Stand Sit to Stand: Supervision, Min guard           General transfer comment: no AD, reaching for single UE support, min guard for safetty    Ambulation/Gait Ambulation/Gait assistance: Min guard Gait Distance (Feet): 116 Feet Assistive device: IV Pole Gait Pattern/deviations: Step-through pattern, Decreased stride length, Wide base of support Gait velocity: reduced     General Gait Details: pt is slow but steady with single UE on IV pole, SpO2 >92% on RA throughout ambulation, pt endorings SOB at end of ambultaion wih O2 replaced at 2L for pt comfort   Stairs             Wheelchair Mobility    Modified Rankin (Stroke Patients Only)       Balance Overall balance assessment: Needs assistance Sitting-balance support: Feet supported Sitting balance-Leahy Scale: Fair     Standing balance support: Bilateral upper extremity supported, During functional activity Standing balance-Leahy Scale: Poor                              Cognition Arousal/Alertness: Awake/alert Behavior During Therapy: WFL for tasks assessed/performed Overall Cognitive Status: Within Functional Limits for tasks assessed  Exercises      General Comments General comments (skin integrity, edema, etc.): SpO2 99% on 2L on entry, 96% RA at rest, >90% throughout ambulation, however replaced 2L at end of session for pt comfort secondary to SOB.      Pertinent Vitals/Pain      Home Living                          Prior Function            PT Goals  (current goals can now be found in the care plan section) Acute Rehab PT Goals Patient Stated Goal: to get home and feel better PT Goal Formulation: With patient Time For Goal Achievement: 06/13/22    Frequency    Min 3X/week      PT Plan      Co-evaluation              AM-PAC PT "6 Clicks" Mobility   Outcome Measure  Help needed turning from your back to your side while in a flat bed without using bedrails?: A Little Help needed moving from lying on your back to sitting on the side of a flat bed without using bedrails?: A Little Help needed moving to and from a bed to a chair (including a wheelchair)?: A Little Help needed standing up from a chair using your arms (e.g., wheelchair or bedside chair)?: A Little Help needed to walk in hospital room?: A Little Help needed climbing 3-5 steps with a railing? : A Lot 6 Click Score: 17    End of Session Equipment Utilized During Treatment: Oxygen Activity Tolerance: Patient tolerated treatment well;Patient limited by fatigue Patient left: in bed;with call bell/phone within reach (seated EOB) Nurse Communication: Mobility status;Other (comment) (requires fully upright posture due to breathing even on O2) PT Visit Diagnosis: Unsteadiness on feet (R26.81);Muscle weakness (generalized) (M62.81);Dizziness and giddiness (R42)     Time: 6553-7482 PT Time Calculation (min) (ACUTE ONLY): 17 min  Charges:  $Gait Training: 8-22 mins                     Holley Wirt R. PTA Acute Rehabilitation Services Office: [place] 05/31/2022, 12:32 PM

## 2022-05-31 NOTE — Care Management Important Message (Signed)
Important Message  Patient Details  Name: ADRA SHEPLER MRN: [id number] Date of Birth: [birth year]   Medicare Important Message Given:  Yes     [name] 05/31/2022, 9:45 AM

## 2022-05-31 NOTE — Progress Notes (Signed)
PROGRESS NOTE    [name]  IWL:[id number] DOB: June 22, 1951 DOA: 05/26/2022 PCP: Silverio Decamp, MD   Brief Narrative:  71 year old with history of hypertension, hyperlipidemia, chronic A-fib on Eliquis status post ablation, hypothyroidism GERD and anxiety presented to the ER with low hemoglobin.  Patient recently suffering from multiple abdominal symptoms ongoing nausea vomiting poor appetite and diarrhea since 7/9.  Previous hospitalization 7/9-7/17, admitted to [facility] with nausea vomiting and diarrhea, CT scan consistent with right-sided colitis and pneumatosis coli in the proximal hepatic flexure.  Initially treated with IV antibiotics, changed to Augmentin and discharged home where she completed her antibiotic course.    7/24 - (current admission) -noted to have bright red blood per rectum, instructed to discontinue Eliquis in the outpatient setting but with worsening symptoms of fatigue and weakness presented to the ED for symptoms noted to be profoundly anemic requiring transfusion, GI consulted. 7/28-31 - minimal improvement - remains high risk for colonoscopy given ? pneumatosis on previous imaging. Repeat imaging per GI - CT abd/pelvis shows questionably worsening, albeit minimally inflammation/colitis. 8/1 - GI continues to follow for possible endoscopy needs.  Assessment & Plan:   Principal Problem:   Symptomatic anemia Active Problems:   GI bleed   AKI (acute kidney injury) (HCC)   History of colitis   Diarrhea   Paroxysmal atrial fibrillation (HCC)   Chronic anticoagulation   Benign essential hypertension   Hypothyroidism   GERD (gastroesophageal reflux disease)   Morbid obesity (HCC)  Acute symptomatic blood loss anemia due to GI bleed, POA Suspected infectious versus ischemic colitis -Hemoglobin 6.6 at intake status post 2 unit PRBC with no further bleeding; hemoglobin generally stable -Continue Zosyn per GI -GI following, appreciate  insight recommendations, currently treating medically, will likely transition outpatient for further endoscopy findings given high risk given questionable pneumatosis on previous imaging -Repeat CT abd/pelvis shows minimally worsening colitis  Acute kidney injury, severe hypokalemia, resolved Volume overload, likely iatrogenic DC IV fluids - resume home diuretics due to volume overload Advancing diet as above per GI   Paroxysmal atrial fibrillation: Currently in sinus rhythm, Eliquis on hold given above GI bleed  Continue to hold Eliquis in the setting of possible procedure, GI bleed resolving   Essential hypertension:  Continue metoprolol, irbesartan Diuretics remain on hold   Hypothyroidism:  Euthyroid on Synthroid replacement.  DVT prophylaxis: Early ambulation, SCDs in the setting of acute bleed Code Status: Full Family Communication: None present  Status is: Inpatient  Dispo: The patient is from: Home              Anticipated d/c is to: Home              Anticipated d/c date is: 48 to 72 hours pending clinical course and need for further procedure              Patient currently not medically stable for discharge  Consultants:  GI  Procedures:  None  Antimicrobials:  Zosyn  Subjective: No acute issues or events overnight - abdominal pain ongoing but minimally improving, appetite remains poor.  Objective: Vitals:   05/30/22 2042 05/31/22 0042 05/31/22 0435 05/31/22 0734  BP: (!) 148/69 (!) 171/82 118/79 (!) 144/77  Pulse: 61 66 (!) 57 71  Resp: '20 20 19 18  '$ Temp: 97.9 F (36.6 C) 97.9 F (36.6 C) 98 F (36.7 C) (!) 97.4 F (36.3 C)  TempSrc: Oral Oral Oral Oral  SpO2: 97% 96% 97% 100%  Weight:  Height:       No intake or output data in the 24 hours ending 05/31/22 0819  Filed Weights   05/26/22 1043  Weight: 111.2 kg    Examination:  General:  Pleasantly resting in bed, No acute distress. HEENT:  Normocephalic atraumatic.  Sclerae nonicteric,  noninjected.  Extraocular movements intact bilaterally. Neck:  Without mass or deformity.  Trachea is midline. Lungs:  Clear to auscultate bilaterally without rhonchi, wheeze, or rales. Heart:  Regular rate and rhythm.  Without murmurs, rubs, or gallops. Abdomen:  Soft, nontender, nondistended.  Without guarding or rebound. Extremities: Without cyanosis, clubbing, 3+ pitting edema bilateral lower extremity Vascular:  Dorsalis pedis and posterior tibial pulses palpable bilaterally. Skin:  Warm and dry, no erythema, no ulcerations.  Data Reviewed: I have personally reviewed following labs and imaging studies  CBC: Recent Labs  Lab 05/25/22 0000 05/26/22 0520 05/26/22 1845 05/27/22 0122 05/27/22 1755 05/29/22 0342 05/29/22 1634 05/30/22 0600 05/30/22 1628 05/31/22 0605  WBC 5.2 5.3  --  3.7*  --   --  4.7  --   --   --   NEUTROABS 3,489 3.6  --   --   --   --   --   --   --   --   HGB 6.0* 6.6*   < > 7.4*   < > 8.0* 8.7* 9.0* 9.2* 9.1*  HCT 18.6* 21.2*   < > 23.0*   < > 26.5* 27.8* 29.8* 30.5* 29.8*  MCV 100.5* 105.0*  --  94.7  --   --  98.9  --   --   --   PLT 217 244  --  165  --   --  165  --   --   --    < > = values in this interval not displayed.    Basic Metabolic Panel: Recent Labs  Lab 05/27/22 0122 05/28/22 0535 05/29/22 1634 05/30/22 0600 05/31/22 0605  NA 141 144 139 141 140  K 2.8* 4.2 4.0 3.7 4.0  CL 102 108 106 107 106  CO2 '28 30 27 27 26  '$ GLUCOSE 97 103* 149* 98 101*  BUN 6* <5* 8 9 7*  CREATININE 0.85 0.77 0.85 0.73 0.87  CALCIUM 8.5* 8.3* 8.1* 8.3* 8.5*  MG 1.8  --   --   --   --   PHOS 3.7  --   --   --   --     GFR: Estimated Creatinine Clearance: 71.1 mL/min (by C-G formula based on SCr of 0.87 mg/dL). Liver Function Tests: Recent Labs  Lab 05/25/22 0000 05/26/22 0647 05/29/22 1634  AST 21 40 40  ALT '15 28 27  '$ ALKPHOS  --  71 78  BILITOT 0.6 0.5 0.9  PROT 4.8* 5.5* 5.5*  ALBUMIN  --  2.8* 2.8*    Recent Labs  Lab 05/25/22 0000  05/29/22 1634  LIPASE 9 25  AMYLASE 19* 18*    No results for input(s): "AMMONIA" in the last 168 hours. Coagulation Profile: Recent Labs  Lab 05/26/22 0520  INR 1.1    Cardiac Enzymes: No results for input(s): "CKTOTAL", "CKMB", "CKMBINDEX", "TROPONINI" in the last 168 hours. BNP (last 3 results) No results for input(s): "PROBNP" in the last 8760 hours. HbA1C: No results for input(s): "HGBA1C" in the last 72 hours. CBG: No results for input(s): "GLUCAP" in the last 168 hours. Lipid Profile: No results for input(s): "CHOL", "HDL", "LDLCALC", "TRIG", "CHOLHDL", "LDLDIRECT" in the last 72  hours. Thyroid Function Tests: No results for input(s): "TSH", "T4TOTAL", "FREET4", "T3FREE", "THYROIDAB" in the last 72 hours. Anemia Panel: No results for input(s): "VITAMINB12", "FOLATE", "FERRITIN", "TIBC", "IRON", "RETICCTPCT" in the last 72 hours. Sepsis Labs: Recent Labs  Lab 05/29/22 1634  LATICACIDVEN 2.0*     No results found for this or any previous visit (from the past 240 hour(s)).   Radiology Studies: CT ABDOMEN PELVIS W CONTRAST  Result Date: 05/30/2022 CLINICAL DATA:  Diverticulitis. Complication suspected. Patient treated for presumed infectious colitis and pneumatosis earlier this month. Ongoing abdominal pain. Latest CT angio 05/26/2022 demonstrated no bleeding but there were diverticula with bowel wall thickening and pericolonic fat stranding at the ascending colon/hepatic flexure suspicious for developing or resolving diverticulitis. EXAM: CT ABDOMEN AND PELVIS WITH CONTRAST TECHNIQUE: Multidetector CT imaging of the abdomen and pelvis was performed using the standard protocol following bolus administration of intravenous contrast. RADIATION DOSE REDUCTION: This exam was performed according to the departmental dose-optimization program which includes automated exposure control, adjustment of the mA and/or kV according to patient size and/or use of iterative reconstruction  technique. CONTRAST:  110m OMNIPAQUE IOHEXOL 300 MG/ML  SOLN COMPARISON:  KUB 05/29/2022; CTA abdomen and pelvis FINDINGS: Lower chest: There are partially visualized mild-to-moderate right-greater-than-left pleural effusions that are similar to prior. Curvilinear bilateral lower lung subsegmental atelectasis versus scarring. Heart size is again moderately enlarged. Hepatobiliary: Smooth liver contours. Diffuse decreased density is again seen throughout the liver suggesting fatty infiltration. No focal liver mass is identified. The gallbladder is unremarkable. No intrahepatic or extrahepatic biliary ductal dilatation. Pancreas: No mass or inflammatory fat stranding. No pancreatic ductal dilatation is seen. Spleen: Normal in size without focal abnormality. Adrenals/Urinary Tract: Adrenal glands are unremarkable. The kidneys enhance uniformly and are symmetric in size without hydronephrosis. No renal stone is seen. No renal mass is seen. No focal urinary bladder wall thickening. Stomach/Bowel: Mild to moderate sigmoid and mild descending colon diverticulosis without inflammatory changes. There is again mild inflammatory wall thickening of the ascending colon diffusely. This appears minimally worsened compared to recent 05/26/2022 CT. No perforation is seen. The terminal ileum is unremarkable. Normal appendix. No dilated loops of bowel to indicate bowel obstruction. Vascular/Lymphatic: No abdominal aortic aneurysm. Moderate atherosclerotic calcifications. No mesenteric, retroperitoneal, or pelvic lymphadenopathy. Reproductive: The uterus is present.  No gross adnexal abnormality. Other: Small fat containing umbilical hernia. Moderate edema throughout the deep aspect of the abdominal and proximal thigh subcutaneous fat diffusely, similar to prior. There are again hernia mesh coils within the anterior inferior pelvis.No abdominopelvic ascites. No pneumoperitoneum. Musculoskeletal: No acute or significant osseous  findings. IMPRESSION: Compared to 05/26/2022: 1. Mild-to-moderate right-greater-than-left pleural effusions. 2. Unchanged to minimally worsened inflammatory wall thickening of the ascending colon diffusely. This is again suggestive of colitis. 3. Mild-to-moderate sigmoid and mild descending colon diverticulosis without inflammatory changes to indicate acute diverticulitis. Electronically Signed   By: RYvonne KendallM.D.   On: 05/30/2022 17:27   DG Abd 2 Views  Result Date: 05/29/2022 CLINICAL DATA:  Abdominal distention and abdominal pain. EXAM: ABDOMEN - 2 VIEW COMPARISON:  Abdominal radiograph 05/28/2022. FINDINGS: The bowel gas pattern is normal. There is no evidence of free air given the limits of the supine images. No radio-opaque calculi or other significant radiographic abnormality is seen in the abdomen. Surgical tacks noted in the lower pelvis bilaterally. Bilateral small pleural effusions with associated atelectasis. IMPRESSION: Nonobstructive bowel gas pattern. Persistent small bilateral pleural effusions with bibasilar subsegmental atelectasis. Electronically Signed  By: Ileana Roup M.D.   On: 05/29/2022 17:52    Scheduled Meds:  flecainide  50 mg Oral Q12H   furosemide  10 mg Oral BID   gabapentin  1,200 mg Oral QHS   icosapent Ethyl  2 g Oral BID   irbesartan  300 mg Oral Daily   levothyroxine  75 mcg Oral Q breakfast   metoprolol succinate  50 mg Oral Daily   pantoprazole  40 mg Oral Daily   ramelteon  8 mg Oral QHS   sodium chloride flush  3 mL Intravenous Q12H   traZODone  100 mg Oral QHS   venlafaxine XR  225 mg Oral Daily   Continuous Infusions:  sodium chloride     piperacillin-tazobactam (ZOSYN)  IV 3.375 g (05/31/22 0529)     LOS: 5 days   Time spent: 62mn  Fathima Bartl C Kendrah Lovern, DO [facility]  If 7PM-7AM, please contact night-coverage [email or web address]  05/31/2022, 8:19 AM

## 2022-06-01 DIAGNOSIS — R1084 Generalized abdominal pain: Secondary | ICD-10-CM | POA: Diagnosis not present

## 2022-06-01 DIAGNOSIS — R933 Abnormal findings on diagnostic imaging of other parts of digestive tract: Secondary | ICD-10-CM | POA: Diagnosis not present

## 2022-06-01 DIAGNOSIS — Z7901 Long term (current) use of anticoagulants: Secondary | ICD-10-CM | POA: Diagnosis not present

## 2022-06-01 DIAGNOSIS — K625 Hemorrhage of anus and rectum: Secondary | ICD-10-CM | POA: Diagnosis not present

## 2022-06-01 DIAGNOSIS — D649 Anemia, unspecified: Secondary | ICD-10-CM | POA: Diagnosis not present

## 2022-06-01 LAB — BASIC METABOLIC PANEL
Anion gap: 7 (ref 5–15)
BUN: 7 mg/dL — ABNORMAL LOW (ref 8–23)
CO2: 27 mmol/L (ref 22–32)
Calcium: 8.5 mg/dL — ABNORMAL LOW (ref 8.9–10.3)
Chloride: 107 mmol/L (ref 98–111)
Creatinine, Ser: 0.96 mg/dL (ref 0.44–1.00)
GFR, Estimated: >60 mL/min (ref >60)
Glucose, Bld: 98 mg/dL (ref 70–99)
Potassium: 4.4 mmol/L (ref 3.5–5.1)
Sodium: 141 mmol/L (ref 135–145)

## 2022-06-01 LAB — HEMOGLOBIN AND HEMATOCRIT, BLOOD
HCT: 27.7 % — ABNORMAL LOW (ref 36.0–46.0)
Hemoglobin: 8.3 g/dL — ABNORMAL LOW (ref 12.0–15.0)

## 2022-06-01 MED ORDER — POLYETHYLENE GLYCOL 3350 17 GM/SCOOP PO POWD
1.0000 | Freq: Once | ORAL | Status: AC
  Administered 2022-06-01: 238 g via ORAL
  Filled 2022-06-01: qty 255

## 2022-06-01 MED ORDER — METOCLOPRAMIDE HCL 5 MG/ML IJ SOLN
10.0000 mg | Freq: Four times a day (QID) | INTRAMUSCULAR | Status: AC
  Administered 2022-06-01 – 2022-06-02 (×2): 10 mg via INTRAVENOUS
  Filled 2022-06-01 (×2): qty 2

## 2022-06-01 MED ORDER — BISACODYL 5 MG PO TBEC
20.0000 mg | DELAYED_RELEASE_TABLET | Freq: Once | ORAL | Status: AC
  Administered 2022-06-01: 20 mg via ORAL
  Filled 2022-06-01: qty 4

## 2022-06-01 NOTE — Progress Notes (Signed)
Pt for colonoscopy at [facility] tmrw.  See prep orders.   Hgb 8.3.  Vital signs stable Not re-examined.    Scheduled Meds:  [START ON 06/02/2022] bisacodyl  10 mg Oral Once   bisacodyl  10 mg Oral Once   flecainide  50 mg Oral Q12H   furosemide  10 mg Oral BID   gabapentin  1,200 mg Oral QHS   icosapent Ethyl  2 g Oral BID   irbesartan  300 mg Oral Daily   levothyroxine  75 mcg Oral Q breakfast   metoprolol succinate  50 mg Oral Daily   pantoprazole  40 mg Oral Daily   peg 3350 powder  0.5 kit Oral Once   And   peg 3350 powder  0.5 kit Oral Once   ramelteon  8 mg Oral QHS   sodium chloride flush  3 mL Intravenous Q12H   traZODone  100 mg Oral QHS   venlafaxine XR  225 mg Oral Daily   Continuous Infusions:  sodium chloride     piperacillin-tazobactam (ZOSYN)  IV 3.375 g (06/01/22 0613)   PRN Meds:.acetaminophen **OR** acetaminophen, albuterol, hyoscyamine, ondansetron **OR** ondansetron (ZOFRAN) IV, oxyCODONE-acetaminophen, polyethylene glycol   A/P  Right-sided, ascending colon inflammation/colitis.  Previous colon pneumatosis last month resolved.  Clinically not improving w zosyn, day 5.  Colonoscopy tmrw.    Chronic Eliquis for A-fib.  On hold.  Not on interim heparin.  Anemia.  Hgb 8.1.  Status post PRBCs x 2.    Dawn Freed  PA-C

## 2022-06-01 NOTE — Progress Notes (Signed)
PROGRESS NOTE    [name]  QPR:[id number] DOB: 1950-11-27 DOA: 05/26/2022 PCP: Silverio Decamp, MD   Brief Narrative:  71 year old with history of hypertension, hyperlipidemia, chronic A-fib on Eliquis status post ablation, hypothyroidism GERD and anxiety presented to the ER with low hemoglobin.  Patient recently suffering from multiple abdominal symptoms ongoing nausea vomiting poor appetite and diarrhea since 7/9.   Previous hospitalization 7/9-7/17, admitted to [facility] with nausea vomiting and diarrhea, CT scan consistent with right-sided colitis and pneumatosis coli in the proximal hepatic flexure.  Initially treated with IV antibiotics, changed to Augmentin and discharged home where she completed her antibiotic course.     7/24 - (current admission) -noted to have bright red blood per rectum, instructed to discontinue Eliquis in the outpatient setting but with worsening symptoms of fatigue and weakness presented to the ED for symptoms noted to be profoundly anemic requiring transfusion, GI consulted. 7/28-31 - minimal improvement - remains high risk for colonoscopy given ? pneumatosis on previous imaging. Repeat imaging per GI - CT abd/pelvis shows questionably worsening, albeit minimally inflammation/colitis. 8/1 - GI continues to follow for possible endoscopy needs.  Assessment & Plan:   Principal Problem:   Symptomatic anemia Active Problems:   GI bleed   AKI (acute kidney injury) (HCC)   History of colitis   Diarrhea   Paroxysmal atrial fibrillation (HCC)   Chronic anticoagulation   Benign essential hypertension   Hypothyroidism   GERD (gastroesophageal reflux disease)   Morbid obesity (HCC)  Acute symptomatic blood loss anemia due to GI bleed, POA Suspected infectious versus ischemic colitis -Hemoglobin 6.6 at intake status post 2 unit PRBC on 05/26/2022, with no further bleeding; hemoglobin generally stable -Continue Zosyn per GI -GI  following, appreciate insight recommendations, currently treating medically, Repeat CT abd/pelvis shows minimally worsening colitis and due to persistent symptoms, GI has now planned for colonoscopy tomorrow.   Acute kidney injury, severe hypokalemia, resolved.   Paroxysmal atrial fibrillation: Currently in sinus rhythm, Eliquis on hold given above GI bleed  Continue to hold Eliquis in the setting of possible procedure, GI bleed resolving   Essential hypertension:  Continue metoprolol, irbesartan Diuretics remain on hold   Hypothyroidism:  Euthyroid on Synthroid replacement.  Acute hypoxic respiratory failure/atelectasis: Requiring 2 L of oxygen, likely due to atelectasis which developed due to her not trying to take deep breaths due to abdominal pain.  Will order incentive spirometry and try to wean as able to.  Patient educated.  DVT prophylaxis: SCDs Start: 05/26/22 0804   Code Status: Full Code  Family Communication:  None present at bedside.  Plan of care discussed with patient in length and he/she verbalized understanding and agreed with it.  Status is: Inpatient Remains inpatient appropriate because: Pending GI work-up   Estimated body mass index is 43.43 kg/m as calculated from the following:   Height as of this encounter: $RemoveBeforeD'5\' 3"'[id number]$  (1.6 m).   Weight as of this encounter: 111.2 kg.    Nutritional Assessment: Body mass index is 43.43 kg/m.Marland Kitchen Seen by dietician.  I agree with the assessment and plan as outlined below: Nutrition Status:        . Skin Assessment: I have examined the patient's skin and I agree with the wound assessment as performed by the wound care RN as outlined below:    Consultants:  GI  Procedures:  As above  Antimicrobials:  Anti-infectives (From admission, onward)    Start     Dose/Rate Route Frequency  Ordered Stop   05/27/22 1030  piperacillin-tazobactam (ZOSYN) IVPB 3.375 g        3.375 g 12.5 mL/hr over 240 Minutes Intravenous Every 8  hours 05/27/22 0943 06/03/22 1359         Subjective: Seen and examined.  Still complains of abdominal pain, mostly lower abdomen, slightly better than yesterday.  No chest pain, but has very minimal shortness of breath.  According to her, pain is improving.  Objective: Vitals:   05/31/22 1946 05/31/22 2336 06/01/22 0436 06/01/22 0730  BP: 129/75 105/77 139/83 (!) 163/77  Pulse: 70 67 68 64  Resp: 18 18 18 19   Temp: 98 F (36.7 C) 98.7 F (37.1 C) 98 F (36.7 C) 97.7 F (36.5 C)  TempSrc: Oral Oral Oral Oral  SpO2: 98% 99% 98% 97%  Weight:      Height:        Intake/Output Summary (Last 24 hours) at 06/01/2022 0931 Last data filed at 06/01/2022 7588 Gross per 24 hour  Intake 662.29 ml  Output 200 ml  Net 462.29 ml   Filed Weights   05/26/22 1043  Weight: 111.2 kg    Examination:  General exam: Appears calm and comfortable, obese Respiratory system: Clear to auscultation. Respiratory effort normal. Cardiovascular system: S1 & S2 heard, RRR. No JVD, murmurs, rubs, gallops or clicks.  +2 pitting edema lower extremity. Gastrointestinal system: Abdomen is moderately distended, soft and has generalized tenderness.  No organomegaly or masses felt. Normal bowel sounds heard. Central nervous system: Alert and oriented. No focal neurological deficits. Extremities: Symmetric 5 x 5 power. Skin: No rashes, lesions or ulcers Psychiatry: Judgement and insight appear normal. Mood & affect appropriate.    Data Reviewed: I have personally reviewed following labs and imaging studies  CBC: Recent Labs  Lab 05/26/22 0520 05/26/22 1845 05/27/22 0122 05/27/22 1755 05/29/22 1634 05/30/22 0600 05/30/22 1628 05/31/22 0605 06/01/22 0148  WBC 5.3  --  3.7*  --  4.7  --   --   --   --   NEUTROABS 3.6  --   --   --   --   --   --   --   --   HGB 6.6*   < > 7.4*   < > 8.7* 9.0* 9.2* 9.1* 8.3*  HCT 21.2*   < > 23.0*   < > 27.8* 29.8* 30.5* 29.8* 27.7*  MCV 105.0*  --  94.7  --  98.9   --   --   --   --   PLT 244  --  165  --  165  --   --   --   --    < > = values in this interval not displayed.   Basic Metabolic Panel: Recent Labs  Lab 05/27/22 0122 05/28/22 0535 05/29/22 1634 05/30/22 0600 05/31/22 0605 06/01/22 0148  NA 141 144 139 141 140 141  K 2.8* 4.2 4.0 3.7 4.0 4.4  CL 102 108 106 107 106 107  CO2 28 30 27 27 26 27   GLUCOSE 97 103* 149* 98 101* 98  BUN 6* <5* 8 9 7* 7*  CREATININE 0.85 0.77 0.85 0.73 0.87 0.96  CALCIUM 8.5* 8.3* 8.1* 8.3* 8.5* 8.5*  MG 1.8  --   --   --   --   --   PHOS 3.7  --   --   --   --   --    GFR: Estimated Creatinine Clearance: 64.4 mL/min (by  C-G formula based on SCr of 0.96 mg/dL). Liver Function Tests: Recent Labs  Lab 05/26/22 0647 05/29/22 1634  AST 40 40  ALT 28 27  ALKPHOS 71 78  BILITOT 0.5 0.9  PROT 5.5* 5.5*  ALBUMIN 2.8* 2.8*   Recent Labs  Lab 05/29/22 1634  LIPASE 25  AMYLASE 18*   No results for input(s): "AMMONIA" in the last 168 hours. Coagulation Profile: Recent Labs  Lab 05/26/22 0520  INR 1.1   Cardiac Enzymes: No results for input(s): "CKTOTAL", "CKMB", "CKMBINDEX", "TROPONINI" in the last 168 hours. BNP (last 3 results) No results for input(s): "PROBNP" in the last 8760 hours. HbA1C: No results for input(s): "HGBA1C" in the last 72 hours. CBG: No results for input(s): "GLUCAP" in the last 168 hours. Lipid Profile: No results for input(s): "CHOL", "HDL", "LDLCALC", "TRIG", "CHOLHDL", "LDLDIRECT" in the last 72 hours. Thyroid Function Tests: No results for input(s): "TSH", "T4TOTAL", "FREET4", "T3FREE", "THYROIDAB" in the last 72 hours. Anemia Panel: No results for input(s): "VITAMINB12", "FOLATE", "FERRITIN", "TIBC", "IRON", "RETICCTPCT" in the last 72 hours. Sepsis Labs: Recent Labs  Lab 05/29/22 1634  LATICACIDVEN 2.0*    No results found for this or any previous visit (from the past 240 hour(s)).   Radiology Studies: CT ABDOMEN PELVIS W CONTRAST  Result Date:  05/30/2022 CLINICAL DATA:  Diverticulitis. Complication suspected. Patient treated for presumed infectious colitis and pneumatosis earlier this month. Ongoing abdominal pain. Latest CT angio 05/26/2022 demonstrated no bleeding but there were diverticula with bowel wall thickening and pericolonic fat stranding at the ascending colon/hepatic flexure suspicious for developing or resolving diverticulitis. EXAM: CT ABDOMEN AND PELVIS WITH CONTRAST TECHNIQUE: Multidetector CT imaging of the abdomen and pelvis was performed using the standard protocol following bolus administration of intravenous contrast. RADIATION DOSE REDUCTION: This exam was performed according to the departmental dose-optimization program which includes automated exposure control, adjustment of the mA and/or kV according to patient size and/or use of iterative reconstruction technique. CONTRAST:  114mL OMNIPAQUE IOHEXOL 300 MG/ML  SOLN COMPARISON:  KUB 05/29/2022; CTA abdomen and pelvis FINDINGS: Lower chest: There are partially visualized mild-to-moderate right-greater-than-left pleural effusions that are similar to prior. Curvilinear bilateral lower lung subsegmental atelectasis versus scarring. Heart size is again moderately enlarged. Hepatobiliary: Smooth liver contours. Diffuse decreased density is again seen throughout the liver suggesting fatty infiltration. No focal liver mass is identified. The gallbladder is unremarkable. No intrahepatic or extrahepatic biliary ductal dilatation. Pancreas: No mass or inflammatory fat stranding. No pancreatic ductal dilatation is seen. Spleen: Normal in size without focal abnormality. Adrenals/Urinary Tract: Adrenal glands are unremarkable. The kidneys enhance uniformly and are symmetric in size without hydronephrosis. No renal stone is seen. No renal mass is seen. No focal urinary bladder wall thickening. Stomach/Bowel: Mild to moderate sigmoid and mild descending colon diverticulosis without inflammatory  changes. There is again mild inflammatory wall thickening of the ascending colon diffusely. This appears minimally worsened compared to recent 05/26/2022 CT. No perforation is seen. The terminal ileum is unremarkable. Normal appendix. No dilated loops of bowel to indicate bowel obstruction. Vascular/Lymphatic: No abdominal aortic aneurysm. Moderate atherosclerotic calcifications. No mesenteric, retroperitoneal, or pelvic lymphadenopathy. Reproductive: The uterus is present.  No gross adnexal abnormality. Other: Small fat containing umbilical hernia. Moderate edema throughout the deep aspect of the abdominal and proximal thigh subcutaneous fat diffusely, similar to prior. There are again hernia mesh coils within the anterior inferior pelvis.No abdominopelvic ascites. No pneumoperitoneum. Musculoskeletal: No acute or significant osseous findings. IMPRESSION: Compared to 05/26/2022:  1. Mild-to-moderate right-greater-than-left pleural effusions. 2. Unchanged to minimally worsened inflammatory wall thickening of the ascending colon diffusely. This is again suggestive of colitis. 3. Mild-to-moderate sigmoid and mild descending colon diverticulosis without inflammatory changes to indicate acute diverticulitis. Electronically Signed   By: Yvonne Kendall M.D.   On: 05/30/2022 17:27    Scheduled Meds:  [START ON 06/02/2022] bisacodyl  10 mg Oral Once   bisacodyl  10 mg Oral Once   flecainide  50 mg Oral Q12H   furosemide  10 mg Oral BID   gabapentin  1,200 mg Oral QHS   icosapent Ethyl  2 g Oral BID   irbesartan  300 mg Oral Daily   levothyroxine  75 mcg Oral Q breakfast   metoprolol succinate  50 mg Oral Daily   pantoprazole  40 mg Oral Daily   peg 3350 powder  0.5 kit Oral Once   And   peg 3350 powder  0.5 kit Oral Once   ramelteon  8 mg Oral QHS   sodium chloride flush  3 mL Intravenous Q12H   traZODone  100 mg Oral QHS   venlafaxine XR  225 mg Oral Daily   Continuous Infusions:  sodium chloride      piperacillin-tazobactam (ZOSYN)  IV 3.375 g (06/01/22 0160)     LOS: 6 days   Darliss Cheney, MD [facility]  06/01/2022, 9:31 AM   *Please note that this is a verbal dictation therefore any spelling or grammatical errors are due to the "[place] [employer]" system interpretation.  Please page via [place] and do not message via secure chat for urgent patient care matters. Secure chat can be used for non urgent patient care matters.  How to contact the [facility] Attending or Consulting provider [place] or covering provider during after hours [place], for this patient?  Check the care team in [facility] and look for a) attending/consulting TR[facility] provider listed and b) the [facility] team listed. Page or secure chat [phone]. Log into [email or web address] and use [place]'s universal password to access. If you do not have the password, please contact the hospital operator. Locate the [facility] provider you are looking for under [facility] and page to a number that you can be directly reached. If you still have difficulty reaching the provider, please page the [facility] (Director on Call) for the Hospitalists listed on amion for assistance.

## 2022-06-01 NOTE — Progress Notes (Signed)
Occupational Therapy Treatment Patient Details Name: [name] MRN: [id number] DOB: Jun 10, 1951 Today's Date: 06/01/2022   History of present illness 71 yo female with onset of abd pain, N/V/D. and rectal bleeding was admitted on 7/27, MD concerned for diverticulitis vs mass. has pleural effusion and atelectasis, Hgb low with 2 units PRBC's transfused.  Holding her Eliquis.  PMHx:  hypertension, hyperlipidemia, chronic A-fib on Eliquis status post ablation, hypothyroidism GERD and anxiety   OT comments  Pt progressing with OOB mobility and ADL tasks. Pt continues to have edema in BLEs. Pt standing x2 mins x2 times with seated rest break in between at sink when performing grooming tasks. Pt set-upA to minguardA for mobility and RW for OOB ADL tasks. Pt on RA >90% with exertion. Pt would benefit from continued OT skilled services. OT following acutely.   Recommendations for follow up therapy are one component of a multi-disciplinary discharge planning process, led by the attending physician.  Recommendations may be updated based on patient status, additional functional criteria and insurance authorization.    Follow Up Recommendations  Home health OT    Assistance Recommended at Discharge Set up Supervision/Assistance  Patient can return home with the following  A little help with walking and/or transfers;A little help with bathing/dressing/bathroom;Assistance with cooking/housework;Direct supervision/assist for medications management;Direct supervision/assist for financial management;Assist for transportation;Help with stairs or ramp for entrance   Equipment Recommendations  BSC/3in1    Recommendations for Other Services      Precautions / Restrictions Precautions Precautions: Fall;Other (comment) Precaution Comments: needs continuous O2 Restrictions Weight Bearing Restrictions: No       Mobility Bed Mobility Overal bed mobility: Needs Assistance Bed Mobility: Supine to Sit      Supine to sit: Supervision          Transfers Overall transfer level: Needs assistance Equipment used: None Transfers: Sit to/from Stand Sit to Stand: Supervision           General transfer comment: no AD     Balance Overall balance assessment: Needs assistance Sitting-balance support: Feet supported Sitting balance-Leahy Scale: Fair     Standing balance support: Single extremity supported, During functional activity Standing balance-Leahy Scale: Fair                             ADL either performed or assessed with clinical judgement   ADL Overall ADL's : Needs assistance/impaired     Grooming: Set up;Standing;Supervision/safety Grooming Details (indicate cue type and reason): standing x2 mins x2 times with seated rest break in between.                 Toilet Transfer: Supervision/safety;Rolling walker (2 wheels)   Toileting- Clothing Manipulation and Hygiene: Supervision/safety;Sit to/from stand Toileting - Clothing Manipulation Details (indicate cue type and reason): Wants her toilet aide from home to assist.     Functional mobility during ADLs: Min guard;Cueing for safety General ADL Comments: Pt set-upA to minguardA for mobility and RW for OOB ADL tasks.    Extremity/Trunk Assessment Upper Extremity Assessment Upper Extremity Assessment: Overall WFL for tasks assessed   Lower Extremity Assessment Lower Extremity Assessment: Generalized weakness;RLE deficits/detail;LLE deficits/detail RLE Deficits / Details: edema LLE Deficits / Details: edema        Vision   Vision Assessment?: No apparent visual deficits   Perception     Praxis      Cognition Arousal/Alertness: Awake/alert Behavior During Therapy: WFL for tasks assessed/performed Overall  Cognitive Status: Within Functional Limits for tasks assessed                                          Exercises      Shoulder Instructions       General  Comments O2 >90% on RA. pt applying O2 upon conclusion of session.    Pertinent Vitals/ Pain          Home Living                                          Prior Functioning/Environment              Frequency  Min 2X/week        Progress Toward Goals  OT Goals(current goals can now be found in the care plan section)  Progress towards OT goals: Progressing toward goals  Acute Rehab OT Goals Patient Stated Goal: to go home OT Goal Formulation: With patient Time For Goal Achievement: 06/13/22 Potential to Achieve Goals: Good  Plan Discharge plan remains appropriate    Co-evaluation                 AM-PAC OT "6 Clicks" Daily Activity     Outcome Measure   Help from another person eating meals?: None Help from another person taking care of personal grooming?: A Little Help from another person toileting, which includes using toliet, bedpan, or urinal?: A Little Help from another person bathing (including washing, rinsing, drying)?: A Little Help from another person to put on and taking off regular upper body clothing?: None Help from another person to put on and taking off regular lower body clothing?: A Little 6 Click Score: 20    End of Session Equipment Utilized During Treatment: Oxygen  OT Visit Diagnosis: Unsteadiness on feet (R26.81)   Activity Tolerance Patient tolerated treatment well   Patient Left in chair;with call bell/phone within reach   Nurse Communication Mobility status        Time: 1610-9604 OT Time Calculation (min): 21 min  Charges: OT General Charges $OT Visit: 1 Visit OT Treatments $Self Care/Home Management : 8-22 mins  Jefferey Pica, OTR/L Acute Rehabilitation Services Office: [phone]   Juergen Hardenbrook C 06/01/2022, 3:23 PM

## 2022-06-01 NOTE — H&P (View-Only) (Signed)
Pt for colonoscopy at [facility] tmrw.  See prep orders.   Hgb 8.3.  Vital signs stable Not re-examined.    Scheduled Meds:  [START ON 06/02/2022] bisacodyl  10 mg Oral Once   bisacodyl  10 mg Oral Once   flecainide  50 mg Oral Q12H   furosemide  10 mg Oral BID   gabapentin  1,200 mg Oral QHS   icosapent Ethyl  2 g Oral BID   irbesartan  300 mg Oral Daily   levothyroxine  75 mcg Oral Q breakfast   metoprolol succinate  50 mg Oral Daily   pantoprazole  40 mg Oral Daily   peg 3350 powder  0.5 kit Oral Once   And   peg 3350 powder  0.5 kit Oral Once   ramelteon  8 mg Oral QHS   sodium chloride flush  3 mL Intravenous Q12H   traZODone  100 mg Oral QHS   venlafaxine XR  225 mg Oral Daily   Continuous Infusions:  sodium chloride     piperacillin-tazobactam (ZOSYN)  IV 3.375 g (06/01/22 0613)   PRN Meds:.acetaminophen **OR** acetaminophen, albuterol, hyoscyamine, ondansetron **OR** ondansetron (ZOFRAN) IV, oxyCODONE-acetaminophen, polyethylene glycol   A/P  Right-sided, ascending colon inflammation/colitis.  Previous colon pneumatosis last month resolved.  Clinically not improving w zosyn, day 5.  Colonoscopy tmrw.    Chronic Eliquis for A-fib.  On hold.  Not on interim heparin.  Anemia.  Hgb 8.1.  Status post PRBCs x 2.    Azucena Freed  PA-C

## 2022-06-02 ENCOUNTER — Inpatient Hospital Stay (HOSPITAL_COMMUNITY): Payer: Medicare Other | Admitting: Anesthesiology

## 2022-06-02 ENCOUNTER — Encounter (HOSPITAL_COMMUNITY): Payer: Self-pay | Admitting: Internal Medicine

## 2022-06-02 ENCOUNTER — Encounter (HOSPITAL_COMMUNITY)
Admission: EM | Disposition: A | Discharge status: Home Health | Payer: Self-pay | Source: Home / Self Care | Attending: Family Medicine

## 2022-06-02 DIAGNOSIS — D123 Benign neoplasm of transverse colon: Secondary | ICD-10-CM | POA: Diagnosis not present

## 2022-06-02 DIAGNOSIS — K449 Diaphragmatic hernia without obstruction or gangrene: Secondary | ICD-10-CM

## 2022-06-02 DIAGNOSIS — D509 Iron deficiency anemia, unspecified: Secondary | ICD-10-CM

## 2022-06-02 DIAGNOSIS — K222 Esophageal obstruction: Secondary | ICD-10-CM | POA: Diagnosis not present

## 2022-06-02 DIAGNOSIS — D490 Neoplasm of unspecified behavior of digestive system: Secondary | ICD-10-CM | POA: Diagnosis not present

## 2022-06-02 DIAGNOSIS — D649 Anemia, unspecified: Secondary | ICD-10-CM | POA: Diagnosis not present

## 2022-06-02 HISTORY — PX: ESOPHAGOGASTRODUODENOSCOPY: SHX5428

## 2022-06-02 HISTORY — PX: POLYPECTOMY: SHX5525

## 2022-06-02 HISTORY — PX: COLONOSCOPY: SHX5424

## 2022-06-02 HISTORY — PX: BIOPSY: SHX5522

## 2022-06-02 HISTORY — PX: SUBMUCOSAL TATTOO INJECTION: SHX6856

## 2022-06-02 LAB — BASIC METABOLIC PANEL
Anion gap: 9 (ref 5–15)
BUN: 8 mg/dL (ref 8–23)
CO2: 28 mmol/L (ref 22–32)
Calcium: 8.7 mg/dL — ABNORMAL LOW (ref 8.9–10.3)
Chloride: 104 mmol/L (ref 98–111)
Creatinine, Ser: 0.89 mg/dL (ref 0.44–1.00)
GFR, Estimated: >60 mL/min (ref >60)
Glucose, Bld: 88 mg/dL (ref 70–99)
Potassium: 3.9 mmol/L (ref 3.5–5.1)
Sodium: 141 mmol/L (ref 135–145)

## 2022-06-02 LAB — CBC
HCT: 32.6 % — ABNORMAL LOW (ref 36.0–46.0)
Hemoglobin: 10.1 g/dL — ABNORMAL LOW (ref 12.0–15.0)
MCH: 30.5 pg (ref 26.0–34.0)
MCHC: 31 g/dL (ref 30.0–36.0)
MCV: 98.5 fL (ref 80.0–100.0)
Platelets: 131 10*3/uL — ABNORMAL LOW (ref 150–400)
RBC: 3.31 MIL/uL — ABNORMAL LOW (ref 3.87–5.11)
RDW: 18.6 % — ABNORMAL HIGH (ref 11.5–15.5)
WBC: 3.6 10*3/uL — ABNORMAL LOW (ref 4.0–10.5)
nRBC: 0 % (ref 0.0–0.2)

## 2022-06-02 SURGERY — COLONOSCOPY
Anesthesia: Monitor Anesthesia Care

## 2022-06-02 MED ORDER — ONDANSETRON HCL 4 MG/2ML IJ SOLN
INTRAMUSCULAR | Status: DC | PRN
  Administered 2022-06-02: 4 mg via INTRAVENOUS

## 2022-06-02 MED ORDER — MIDAZOLAM HCL 2 MG/2ML IJ SOLN
INTRAMUSCULAR | Status: DC | PRN
  Administered 2022-06-02: 2 mg via INTRAVENOUS

## 2022-06-02 MED ORDER — SODIUM CHLORIDE 0.9 % IV SOLN: INTRAVENOUS | Status: DC

## 2022-06-02 MED ORDER — EPHEDRINE SULFATE-NACL 50-0.9 MG/10ML-% IV SOSY
PREFILLED_SYRINGE | INTRAVENOUS | Status: DC | PRN
  Administered 2022-06-02: 10 mg via INTRAVENOUS

## 2022-06-02 MED ORDER — LACTATED RINGERS IV SOLN: INTRAVENOUS | Status: DC | PRN

## 2022-06-02 MED ORDER — PROPOFOL 500 MG/50ML IV EMUL
INTRAVENOUS | Status: DC | PRN
  Administered 2022-06-02: 100 ug/kg/min via INTRAVENOUS

## 2022-06-02 MED ORDER — MIDAZOLAM HCL 2 MG/2ML IJ SOLN
INTRAMUSCULAR | Status: AC
  Filled 2022-06-02: qty 2

## 2022-06-02 MED ORDER — LACTATED RINGERS IV SOLN
INTRAVENOUS | Status: DC
  Administered 2022-06-02: 1000 mL via INTRAVENOUS

## 2022-06-02 NOTE — Interval H&P Note (Signed)
History and Physical Interval Note:  06/02/2022 12:52 PM  [name]  has presented today for surgery, with the diagnosis of Colitis, abnormal CT digestive tract, abdominal pain generalized, positive Cologuard, anemia.  The various methods of treatment have been discussed with the patient and family. After consideration of risks, benefits and other options for treatment, the patient has consented to  Procedure(s): COLONOSCOPY (N/A) ESOPHAGOGASTRODUODENOSCOPY (EGD) (N/A) as a surgical intervention.  The patient's history has been reviewed, patient examined, no change in status, stable for surgery.  I have reviewed the patient's chart and labs.  Questions were answered to the patient's satisfaction.    I again had  discussion with the patient regarding the increased risk of complication for colonoscopy with recent pneumatosis/questionable diverticulitis.  Patient indicates understanding and willingness to proceed.  Given patient's refractory and prolonged symptoms, I think the benefits of a diagnostic colonoscopy outweigh benefits.  Plan for EGD as well given patient's anemia and abdominal pain involving the epigastrium.  Daryel November

## 2022-06-02 NOTE — Progress Notes (Signed)
PT Cancellation Note  Patient Details Name: [name] MRN: [id number] DOB: April 14, 1951   Cancelled Treatment:    Reason Eval/Treat Not Completed: Patient declined, no reason specified, pt back on floor from endo, politely declined mobility, still groggy from anesthesia. Will check back tomorrow to continue with PT POC.  Audry Riles. PTA Acute Rehabilitation Services Office: [place] 06/02/2022, 4:15 PM

## 2022-06-02 NOTE — Anesthesia Procedure Notes (Signed)
Procedure Name: MAC Date/Time: 06/02/2022 1:05 PM  Performed by: Eligha Bridegroom, CRNAPre-anesthesia Checklist: Patient identified, Emergency Drugs available, Suction available, Patient being monitored and Timeout performed Patient Re-evaluated:Patient Re-evaluated prior to induction Oxygen Delivery Method: Nasal cannula Preoxygenation: Pre-oxygenation with 100% oxygen Induction Type: IV induction

## 2022-06-02 NOTE — Op Note (Signed)
[facility] Patient Name: [name] Procedure Date : 06/02/2022 MRN: [id number] Attending MD: Gladstone Pih. Candis Schatz , MD Date of Birth: 1951-04-15 CSN: [id number] Age: 71 Admit Type: Inpatient Procedure:                Upper GI endoscopy Indications:              Epigastric abdominal pain, Iron deficiency anemia Providers:                Nicki Reaper E. Candis Schatz, MD, Mikey College, RN,                            Gloris Ham, Technician Referring MD:              Medicines:                Monitored Anesthesia Care Complications:            No immediate complications. Estimated Blood Loss:     Estimated blood loss: none. Procedure:                Pre-Anesthesia Assessment:                           - Prior to the procedure, a History and Physical                            was performed, and patient medications and                            allergies were reviewed. The patient's tolerance of                            previous anesthesia was also reviewed. The risks                            and benefits of the procedure and the sedation                            options and risks were discussed with the patient.                            All questions were answered, and informed consent                            was obtained. Prior Anticoagulants: The patient has                            taken Eliquis (apixaban), last dose was 9 days                            prior to procedure. ASA Grade Assessment: III - A                            patient with severe systemic disease. After  reviewing the risks and benefits, the patient was                            deemed in satisfactory condition to undergo the                            procedure.                           After obtaining informed consent, the endoscope was                            passed under direct vision. Throughout the                            procedure, the patient's  blood pressure, pulse, and                            oxygen saturations were monitored continuously. The                            GIF-H190 ([id number]) Olympus endoscope was introduced                            through the mouth, and advanced to the third part                            of duodenum. The upper GI endoscopy was                            accomplished without difficulty. The patient                            tolerated the procedure well. Scope In: Scope Out: Findings:      The examined portions of the nasopharynx, oropharynx and larynx were       normal.      A low-grade of narrowing Schatzki ring was found in the lower third of       the esophagus.      The exam of the esophagus was otherwise normal.      A small hiatal hernia was present.      The exam of the stomach was otherwise normal.      The examined duodenum was normal. Impression:               - The examined portions of the nasopharynx,                            oropharynx and larynx were normal.                           - Low-grade of narrowing Schatzki ring.                           - Small hiatal hernia.                           -  Normal examined duodenum.                           - No specimens collected.                           - No abnormalities to explain abdominal pain or                            anemia. Recommendation:           - Patient has a contact number available for                            emergencies. The signs and symptoms of potential                            delayed complications were discussed with the                            patient. Return to normal activities tomorrow.                            Written discharge instructions were provided to the                            patient.                           - Resume previous diet.                           - Continue present medications.                           - Proceed with colonoscopy Procedure Code(s):         --- Professional ---                           [phone], Esophagogastroduodenoscopy, flexible,                            transoral; diagnostic, including collection of                            specimen(s) by brushing or washing, when performed                            (separate procedure) Diagnosis Code(s):        --- Professional ---                           K22.2, Esophageal obstruction                           K44.9, Diaphragmatic hernia without obstruction or  gangrene                           R10.13, Epigastric pain                           D50.9, Iron deficiency anemia, unspecified CPT copyright 2019 American Medical Association. All rights reserved. The codes documented in this report are preliminary and upon coder review may  be revised to meet current compliance requirements. Jabez Molner E. Candis Schatz, MD 06/02/2022 2:02:57 PM This report has been signed electronically. Number of Addenda: 0

## 2022-06-02 NOTE — Plan of Care (Signed)

## 2022-06-02 NOTE — Progress Notes (Signed)
Patient returned to 4E from Endo. Vitals taken and stable. Patient oriented to room and staff. Family at the bedside. Call bell within reach. [name]

## 2022-06-02 NOTE — Progress Notes (Signed)
PROGRESS NOTE    [name]  BMW:[id number] DOB: 24-May-1951 DOA: 05/26/2022 PCP: Silverio Decamp, MD   Brief Narrative:  71 year old with history of hypertension, hyperlipidemia, chronic A-fib on Eliquis status post ablation, hypothyroidism GERD and anxiety presented to the ER with low hemoglobin.  Patient recently suffering from multiple abdominal symptoms ongoing nausea vomiting poor appetite and diarrhea since 7/9.   Previous hospitalization 7/9-7/17, admitted to [place] with nausea vomiting and diarrhea, CT scan consistent with right-sided colitis and pneumatosis coli in the proximal hepatic flexure.  Initially treated with IV antibiotics, changed to Augmentin and discharged home where she completed her antibiotic course.     7/24 - (current admission) -noted to have bright red blood per rectum, instructed to discontinue Eliquis in the outpatient setting but with worsening symptoms of fatigue and weakness presented to the ED for symptoms noted to be profoundly anemic requiring transfusion, GI consulted. 7/28-31 - minimal improvement - remains high risk for colonoscopy given ? pneumatosis on previous imaging. Repeat imaging per GI - CT abd/pelvis shows questionably worsening, albeit minimally inflammation/colitis. 8/1 - GI continues to follow for possible endoscopy needs.  Assessment & Plan:   Principal Problem:   Symptomatic anemia Active Problems:   GI bleed   AKI (acute kidney injury) (HCC)   History of colitis   Diarrhea   Paroxysmal atrial fibrillation (HCC)   Chronic anticoagulation   Benign essential hypertension   Hypothyroidism   GERD (gastroesophageal reflux disease)   Morbid obesity (HCC)  Acute symptomatic blood loss anemia due to GI bleed, POA Suspected infectious versus ischemic colitis -Hemoglobin 6.6 at intake status post 2 unit PRBC on 05/26/2022, with no further bleeding; hemoglobin generally stable since then. -Continue Zosyn per  GI  Repeat CT abd/pelvis shows minimally worsening colitis and due to persistent symptoms, GI has now planned for colonoscopy today.   Acute kidney injury, severe hypokalemia, resolved.   Paroxysmal atrial fibrillation: Currently in sinus rhythm, Eliquis on hold given above GI bleed  Continue to hold Eliquis in the setting of possible procedure, GI bleed resolving   Essential hypertension:  Continue metoprolol, irbesartan Diuretics remain on hold   Hypothyroidism:  Euthyroid on Synthroid replacement.  Acute hypoxic respiratory failure/atelectasis: Was requiring 2 L of oxygen yesterday, feels better, she is off of oxygen and on room air.  Continue with incentive spirometry.  DVT prophylaxis: SCDs Start: 05/26/22 0804   Code Status: Full Code  Family Communication: Daughter present at bedside.  Plan of care discussed with patient in length and he/she verbalized understanding and agreed with it.  Status is: Inpatient Remains inpatient appropriate because: Pending GI work-up   Estimated body mass index is 43.43 kg/m as calculated from the following:   Height as of this encounter: '5\' 3"'$  (1.6 m).   Weight as of this encounter: 111.2 kg.    Nutritional Assessment: Body mass index is 43.43 kg/m.Marland Kitchen Seen by dietician.  I agree with the assessment and plan as outlined below: Nutrition Status:        . Skin Assessment: I have examined the patient's skin and I agree with the wound assessment as performed by the wound care RN as outlined below:    Consultants:  GI  Procedures:  As above  Antimicrobials:  Anti-infectives (From admission, onward)    Start     Dose/Rate Route Frequency Ordered Stop   05/27/22 1030  piperacillin-tazobactam (ZOSYN) IVPB 3.375 g        3.375 g 12.5  mL/hr over 240 Minutes Intravenous Every 8 hours 05/27/22 0943 06/03/22 1359         Subjective: Patient seen and examined.  Daughter at the bedside.  Patient feels very weak due to colonoscopy  GI prep/GoLytely.  Abdominal pain slightly improved.  No other complaint.  Objective: Vitals:   06/02/22 0017 06/02/22 0327 06/02/22 0927 06/02/22 1134  BP: 124/74 (!) 173/83 (!) 158/82 (!) 154/72  Pulse: (!) 51 79 70 64  Resp: '17 17 18 18  '$ Temp: 97.9 F (36.6 C) 98 F (36.7 C) 97.7 F (36.5 C) 97.7 F (36.5 C)  TempSrc: Oral Oral Oral Oral  SpO2: 98% 98%  97%  Weight:      Height:        Intake/Output Summary (Last 24 hours) at 06/02/2022 1136 Last data filed at 06/02/2022 0900 Gross per 24 hour  Intake 265 ml  Output --  Net 265 ml    Filed Weights   05/26/22 1043  Weight: 111.2 kg    Examination:  General exam: Appears calm and comfortable, obese Respiratory system: Clear to auscultation. Respiratory effort normal. Cardiovascular system: S1 & S2 heard, RRR. No JVD, murmurs, rubs, gallops or clicks. No pedal edema. Gastrointestinal system: Abdomen is nondistended, soft and mild lower abdominal tenderness, improved compared to yesterday. No organomegaly or masses felt. Normal bowel sounds heard. Central nervous system: Alert and oriented. No focal neurological deficits. Extremities: Symmetric 5 x 5 power. Skin: No rashes, lesions or ulcers.  Psychiatry: Judgement and insight appear normal. Mood & affect appropriate.    Data Reviewed: I have personally reviewed following labs and imaging studies  CBC: Recent Labs  Lab 05/27/22 0122 05/27/22 1755 05/29/22 1634 05/30/22 0600 05/30/22 1628 05/31/22 0605 06/01/22 0148 06/02/22 0259  WBC 3.7*  --  4.7  --   --   --   --  3.6*  HGB 7.4*   < > 8.7* 9.0* 9.2* 9.1* 8.3* 10.1*  HCT 23.0*   < > 27.8* 29.8* 30.5* 29.8* 27.7* 32.6*  MCV 94.7  --  98.9  --   --   --   --  98.5  PLT 165  --  165  --   --   --   --  131*   < > = values in this interval not displayed.    Basic Metabolic Panel: Recent Labs  Lab 05/27/22 0122 05/28/22 0535 05/29/22 1634 05/30/22 0600 05/31/22 0605 06/01/22 0148 06/02/22 0259  NA  141   < > 139 141 140 141 141  K 2.8*   < > 4.0 3.7 4.0 4.4 3.9  CL 102   < > 106 107 106 107 104  CO2 28   < > '27 27 26 27 28  '$ GLUCOSE 97   < > 149* 98 101* 98 88  BUN 6*   < > 8 9 7* 7* 8  CREATININE 0.85   < > 0.85 0.73 0.87 0.96 0.89  CALCIUM 8.5*   < > 8.1* 8.3* 8.5* 8.5* 8.7*  MG 1.8  --   --   --   --   --   --   PHOS 3.7  --   --   --   --   --   --    < > = values in this interval not displayed.    GFR: Estimated Creatinine Clearance: 69.5 mL/min (by C-G formula based on SCr of 0.89 mg/dL). Liver Function Tests: Recent Labs  Lab 05/29/22 1634  AST 40  ALT 27  ALKPHOS 78  BILITOT 0.9  PROT 5.5*  ALBUMIN 2.8*    Recent Labs  Lab 05/29/22 1634  LIPASE 25  AMYLASE 18*    No results for input(s): "AMMONIA" in the last 168 hours. Coagulation Profile: No results for input(s): "INR", "PROTIME" in the last 168 hours.  Cardiac Enzymes: No results for input(s): "CKTOTAL", "CKMB", "CKMBINDEX", "TROPONINI" in the last 168 hours. BNP (last 3 results) No results for input(s): "PROBNP" in the last 8760 hours. HbA1C: No results for input(s): "HGBA1C" in the last 72 hours. CBG: No results for input(s): "GLUCAP" in the last 168 hours. Lipid Profile: No results for input(s): "CHOL", "HDL", "LDLCALC", "TRIG", "CHOLHDL", "LDLDIRECT" in the last 72 hours. Thyroid Function Tests: No results for input(s): "TSH", "T4TOTAL", "FREET4", "T3FREE", "THYROIDAB" in the last 72 hours. Anemia Panel: No results for input(s): "VITAMINB12", "FOLATE", "FERRITIN", "TIBC", "IRON", "RETICCTPCT" in the last 72 hours. Sepsis Labs: Recent Labs  Lab 05/29/22 1634  LATICACIDVEN 2.0*     No results found for this or any previous visit (from the past 240 hour(s)).   Radiology Studies: No results found.  Scheduled Meds:  flecainide  50 mg Oral Q12H   furosemide  10 mg Oral BID   gabapentin  1,200 mg Oral QHS   icosapent Ethyl  2 g Oral BID   irbesartan  300 mg Oral Daily   levothyroxine   75 mcg Oral Q breakfast   metoprolol succinate  50 mg Oral Daily   pantoprazole  40 mg Oral Daily   ramelteon  8 mg Oral QHS   sodium chloride flush  3 mL Intravenous Q12H   traZODone  100 mg Oral QHS   venlafaxine XR  225 mg Oral Daily   Continuous Infusions:  sodium chloride     piperacillin-tazobactam (ZOSYN)  IV 3.375 g (06/02/22 0650)     LOS: 7 days   Darliss Cheney, MD [facility]  06/02/2022, 11:36 AM   *Please note that this is a verbal dictation therefore any spelling or grammatical errors are due to the "[place]" system interpretation.  Please page via [place] and do not message via secure chat for urgent patient care matters. Secure chat can be used for non urgent patient care matters.  How to contact the [facility] Attending or Consulting provider [place] or covering provider during after hours [place], for this patient?  Check the care team in [facility] and look for a) attending/consulting [facility] provider listed and b) the [facility] team listed. Page or secure chat [phone]. Log into [email or web address] and use [place]'s universal password to access. If you do not have the password, please contact the hospital operator. Locate the [facility] provider you are looking for under [facility] and page to a number that you can be directly reached. If you still have difficulty reaching the provider, please page the [facility] (Director on Call) for the Hospitalists listed on amion for assistance.

## 2022-06-02 NOTE — Op Note (Signed)
[facility] Patient Name: [name] Procedure Date : 06/02/2022 MRN: [id number] Attending MD: Gladstone Pih. Candis Schatz , MD Date of Birth: 12/15/1950 CSN: [id number] Age: 71 Admit Type: Inpatient Procedure:                Colonoscopy Indications:              Generalized abdominal pain, Hematochezia, Abnormal                            CT of the GI tract Providers:                Nicki Reaper E. Candis Schatz, MD, Mikey College, RN,                            Gloris Ham, Technician Referring MD:              Medicines:                Monitored Anesthesia Care Complications:            No immediate complications. Estimated Blood Loss:     Estimated blood loss was minimal. Procedure:                Pre-Anesthesia Assessment:                           - Prior to the procedure, a History and Physical                            was performed, and patient medications and                            allergies were reviewed. The patient's tolerance of                            previous anesthesia was also reviewed. The risks                            and benefits of the procedure and the sedation                            options and risks were discussed with the patient.                            All questions were answered, and informed consent                            was obtained. Prior Anticoagulants: The patient has                            taken Eliquis (apixaban), last dose was 9 days                            prior to procedure. ASA Grade Assessment: III - A  patient with severe systemic disease. After                            reviewing the risks and benefits, the patient was                            deemed in satisfactory condition to undergo the                            procedure.                           After obtaining informed consent, the colonoscope                            was passed under direct vision. Throughout the                             procedure, the patient's blood pressure, pulse, and                            oxygen saturations were monitored continuously. The                            PCF-190TL ([id number] Olympus colonoscope was                            introduced through the anus and advanced to the the                            hepatic flexure where there was an ulcerated mass                            not traversable. This colonoscope was withdrawn and                            replaced with the ultraslim colonoscope. The                            colonoscopy was performed without difficulty. The                            PCF-H190TL ([id number]) Olympus slim colonoscope was                            introduced through the anus and advanced to the the                            cecum, identified by appendiceal orifice and                            ileocecal valve. Scope In: 1:23:41 PM Scope Out: 1:54:02 PM Scope Withdrawal Time: 0 hours 12 minutes 12 seconds  Total Procedure Duration: 0 hours 30 minutes 21 seconds  Findings:  The perianal and digital rectal examinations were normal. Pertinent       negatives include normal sphincter tone and no palpable rectal lesions.      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 The mass was circumferential. The mass was not traversable with       the pediatric colonoscope, but was traversed with the ultraslim       colonoscope. The mass measured about 2.5 cm in length. No bleeding was       present. Biopsies were taken with a cold forceps for histology.       Estimated blood loss was minimal. Area was tattooed with an injection of       2 mL of Spot (carbon black).      A 5 mm polyp was found in the transverse colon. The polyp was sessile.       The polyp was removed with a cold snare. Resection and retrieval were       complete. Estimated blood loss was minimal.      A 6 mm polyp was found in the  descending colon. The polyp was sessile.       The polyp was removed with a cold snare. Resection and retrieval were       complete. Estimated blood loss was minimal.      Multiple small-mouthed diverticula were found in the sigmoid colon and       descending colon.      The exam was otherwise normal throughout the examined colon.      The retroflexed view of the distal rectum and anal verge was normal and       showed no anal or rectal abnormalities. 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 Biopsied. Tattooed.                           - One 5 mm polyp in the transverse colon, removed                            with a cold snare. Resected and retrieved.                           - One 6 mm polyp in the descending colon, removed                            with a cold snare. Resected and retrieved.                           - Diverticulosis in the sigmoid colon and in the                            descending colon.                           - The distal rectum and anal verge are normal on  retroflexion view. Recommendation:           - Return patient to hospital ward for ongoing care.                           - Resume previous diet.                           - Continue present medications.                           - Await pathology results.                           - Perform CT scan (computed tomography) of the                            chest with contrast.                           - Obtain baseline CE                           - Consult surgery Procedure Code(s):        --- Professional ---                           [phone], Colonoscopy, flexible; with removal of                            tumor(s), polyp(s), or other lesion(s) by snare                            technique                           45381, Colonoscopy, flexible; with directed                             submucosal injection(s), any substance                           63016, 75, Colonoscopy, flexible; with biopsy,                            single or multiple Diagnosis Code(s):        --- Professional ---                           D49.0, Neoplasm of unspecified behavior of                            digestive system                           K56.690, Other partial intestinal obstruction                           K63.5, Polyp of colon  R10.84, Generalized abdominal pain                           K92.1, Melena (includes Hematochezia)                           K57.30, Diverticulosis of large intestine without                            perforation or abscess without bleeding                           R93.3, Abnormal findings on diagnostic imaging of                            other parts of digestive tract CPT copyright 2019 American Medical Association. All rights reserved. The codes documented in this report are preliminary and upon coder review may  be revised to meet current compliance requirements. Rolande Moe E. Candis Schatz, MD 06/02/2022 2:18:09 PM This report has been signed electronically. Number of Addenda: 0

## 2022-06-02 NOTE — Anesthesia Preprocedure Evaluation (Signed)
Anesthesia Evaluation  Patient identified by MRN, date of birth, ID band Patient awake    Reviewed: Allergy & Precautions, NPO status , Patient's Chart, lab work & pertinent test results  History of Anesthesia Complications Negative for: history of anesthetic complications  Airway Mallampati: II  TM Distance: >3 FB Neck ROM: Full    Dental   Pulmonary neg pulmonary ROS,    Pulmonary exam normal        Cardiovascular hypertension, Normal cardiovascular exam+ dysrhythmias Atrial Fibrillation      Neuro/Psych Anxiety Depression negative neurological ROS     GI/Hepatic Neg liver ROS, GERD  ,  Endo/Other  Hypothyroidism Morbid obesity  Renal/GU negative Renal ROS  negative genitourinary   Musculoskeletal negative musculoskeletal ROS (+)   Abdominal   Peds  Hematology  (+) Blood dyscrasia, anemia ,   Anesthesia Other Findings   Reproductive/Obstetrics                             Anesthesia Physical Anesthesia Plan  ASA: 3  Anesthesia Plan: MAC   Post-op Pain Management: Minimal or no pain anticipated   Induction: Intravenous  PONV Risk Score and Plan: 2 and Propofol infusion, TIVA and Treatment may vary due to age or medical condition  Airway Management Planned: Natural Airway, Nasal Cannula and Simple Face Mask  Additional Equipment: None  Intra-op Plan:   Post-operative Plan:   Informed Consent: I have reviewed the patients History and Physical, chart, labs and discussed the procedure including the risks, benefits and alternatives for the proposed anesthesia with the patient or authorized representative who has indicated his/her understanding and acceptance.       Plan Discussed with:   Anesthesia Plan Comments:         Anesthesia Quick Evaluation

## 2022-06-02 NOTE — Transfer of Care (Signed)
2Immediate Anesthesia Transfer of Care Note  Patient: [name]  Procedure(s) Performed: COLONOSCOPY ESOPHAGOGASTRODUODENOSCOPY (EGD) POLYPECTOMY SUBMUCOSAL TATTOO INJECTION  Patient Location: PACU  Anesthesia Type:MAC  Level of Consciousness: drowsy  Airway & Oxygen Therapy: Patient Spontanous Breathing and Patient connected to nasal cannula oxygen  Post-op Assessment: Report given to RN and Post -op Vital signs reviewed and stable  Post vital signs: Reviewed and stable  Last Vitals:  Vitals Value Taken Time  BP 96/59 06/02/22 1403  Temp    Pulse 63 06/02/22 1406  Resp 18 06/02/22 1406  SpO2 95 % 06/02/22 1406  Vitals shown include unvalidated device data.  Last Pain:  Vitals:   06/02/22 1402  TempSrc: (P) Temporal  PainSc:          Complications: No notable events documented.

## 2022-06-03 ENCOUNTER — Inpatient Hospital Stay (HOSPITAL_COMMUNITY): Payer: Medicare Other

## 2022-06-03 DIAGNOSIS — D649 Anemia, unspecified: Secondary | ICD-10-CM | POA: Diagnosis not present

## 2022-06-03 LAB — BASIC METABOLIC PANEL
Anion gap: 8 (ref 5–15)
BUN: 6 mg/dL — ABNORMAL LOW (ref 8–23)
CO2: 26 mmol/L (ref 22–32)
Calcium: 8.7 mg/dL — ABNORMAL LOW (ref 8.9–10.3)
Chloride: 106 mmol/L (ref 98–111)
Creatinine, Ser: 0.82 mg/dL (ref 0.44–1.00)
GFR, Estimated: >60 mL/min (ref >60)
Glucose, Bld: 118 mg/dL — ABNORMAL HIGH (ref 70–99)
Potassium: 3.6 mmol/L (ref 3.5–5.1)
Sodium: 140 mmol/L (ref 135–145)

## 2022-06-03 LAB — CBC
HCT: 28.9 % — ABNORMAL LOW (ref 36.0–46.0)
Hemoglobin: 8.7 g/dL — ABNORMAL LOW (ref 12.0–15.0)
MCH: 30.5 pg (ref 26.0–34.0)
MCHC: 30.1 g/dL (ref 30.0–36.0)
MCV: 101.4 fL — ABNORMAL HIGH (ref 80.0–100.0)
Platelets: 217 10*3/uL (ref 150–400)
RBC: 2.85 MIL/uL — ABNORMAL LOW (ref 3.87–5.11)
RDW: 18.5 % — ABNORMAL HIGH (ref 11.5–15.5)
WBC: 4.2 10*3/uL (ref 4.0–10.5)
nRBC: 0 % (ref 0.0–0.2)

## 2022-06-03 MED ORDER — IOHEXOL 300 MG/ML  SOLN
100.0000 mL | Freq: Once | INTRAMUSCULAR | Status: AC | PRN
  Administered 2022-06-03: 100 mL via INTRAVENOUS

## 2022-06-03 NOTE — Progress Notes (Addendum)
[facility] Progress Note  CC:  Anemia/GI bleeding and abnormal CT scan  Subjective: She has many questions regarding the next step.  Hepatic flexure mass Path report pending.  No nausea or vomiting.  She has intermittent upper abdominal pain, more pronounced to the RUQ area.  No nausea or vomiting.  She is passing nonbloody ribbon like and loose stools several times daily.  No melena.  Daughter is at the bedside  Objective:   EGD 06/02/2022: The examined portions of the nasopharynx, oropharynx and larynx were normal. - Low-grade of narrowing Schatzki ring. - Small hiatal hernia. - Normal examined - Normal examined duodenum. - No specimens collected. - No abnormalities to explain abdominal pain or anemia.  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 Biopsied. Tattooed. - One 5 mm polyp in the transverse colon, removed with a cold snare. Resected and retrieved. - One 6 mm polyp in the descending colon, removed with a cold snare. Resected and retrieved. - Diverticulosis in the sigmoid colon and in the descending colon. - The distal rectum and anal verge are normal on retroflexion view. Impression: - Return patient to hospital ward for ongoing care. - Resume previous diet. - Continue present medications. - Await pathology results. - Perform CT scan (computed tomography) of the chest with contrast. - Obtain baseline CE - Consult surgery   Vital signs in last 24 hours: Temp:  [97.5 F (36.4 C)-98 F (36.7 C)] (P) 97.6 F (36.4 C) (08/04 1104) Pulse Rate:  [62-88] 84 (08/04 0803) Resp:  [17-22] (P) 20 (08/04 1104) BP: (96-158)/(59-85) 158/85 (08/04 0803) SpO2:  [93 %-99 %] 94 % (08/04 0803) Last BM Date : 06/02/22  General: Obese 71 year old female in no acute distress Heart: Regular rhythm, no murmurs. Pulm: Breath sounds clear throughout. Abdomen: Soft, tenderness throughout the upper abdomen > to the RUQ  area without rebound or guarding.  Hypoactive bowel sounds to all 4 quadrants. Extremities:  Without edema. Neurologic:  Alert and  oriented x 4. Grossly normal neurologically. Psych:  Alert and cooperative. Normal mood and affect.  Intake/Output from previous day: 08/03 0701 - 08/04 0700 In: 473.8 [P.O.:360; IV Piggyback:113.8] Out: 400 [Urine:400] Intake/Output this shift: Total I/O In: -  Out: 400 [Urine:400]  Lab Results: Recent Labs    06/01/22 0148 06/02/22 0259 06/03/22 0330  WBC  --  3.6* 4.2  HGB 8.3* 10.1* 8.7*  HCT 27.7* 32.6* 28.9*  PLT  --  131* 217   BMET Recent Labs    06/01/22 0148 06/02/22 0259 06/03/22 0330  NA 141 141 140  K 4.4 3.9 3.6  CL 107 104 106  CO2 '27 28 26  '$ GLUCOSE 98 88 118*  BUN 7* 8 6*  CREATININE 0.96 0.89 0.82  CALCIUM 8.5* 8.7* 8.7*   LFT No results for input(s): "PROT", "ALBUMIN", "AST", "ALT", "ALKPHOS", "BILITOT", "BILIDIR", "IBILI" in the last 72 hours. PT/INR No results for input(s): "LABPROT", "INR" in the last 72 hours. Hepatitis Panel No results for input(s): "HEPBSAG", "HCVAB", "HEPAIGM", "HEPBIGM" in the last 72 hours.  No results found.  Assessment / Plan:  71 year old female admitted to the hospital with anemia and rectal bleeding. Admission Hg 6.0 (baseline Hg 10) -> transfused 2 units of PRBCS. CTAP 05/30/2022 showed right-sided inflammation/colitis. Previous colon pneumatosis last month resolved.  S/P EGD 8/3 identified a Schatzki's ring, small hiatal hernia otherwise was normal. S/P colonoscopy 06/02/2022 identified a partially obstructing tumor at the hepatic flexure, likely  malignant, one 5 mm polyp removed from the transverse colon and one 6 mm polyp removed from the descending colon and diverticulosis in the sigmoid and descending colon.  No evidence of colitis.  Zosyn discontinued.  Hg 10.1 -> 8.7. No overt GI bleeding at this time.  -Await path report -Chest CT with contrast for staging -General surgery  consult -CBC, iron panel in am -Transfuse for Hg < 8 -Soft diet as tolerated  GERD. EGD 8/3 showed a Schatzki's ring, small hiatal hernia otherwise was normal.  -Continue Pantoprazole '40mg'$  po QD  Atrial fibrillation, Eliquis on hold  Acute hypoxic respiratory failure, resolved. Off oxygen [place].     Principal Problem:   Symptomatic anemia Active Problems:   Benign essential hypertension   Hypothyroidism   GERD (gastroesophageal reflux disease)   Morbid obesity (HCC)   GI bleed   History of colitis   AKI (acute kidney injury) ([place])   Diarrhea   Paroxysmal atrial fibrillation (HCC)   Chronic anticoagulation     LOS: 8 days   Noralyn Pick  06/03/2022, 12:47 PM    Attending physician's note   I have taken history, reviewed the chart and examined the patient. I performed a substantive portion of this encounter, including complete performance of at least one of the key components, in conjunction with the APP. I agree with the Advanced Practitioner's note, impression and recommendations.   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  D/W pt and daughter in detail   Carmell Austria, MD [facility] [phone]

## 2022-06-03 NOTE — Progress Notes (Addendum)
PROGRESS NOTE    [name]  IZT:[id number] DOB: 1951/08/06 DOA: 05/26/2022 PCP: Silverio Decamp, MD   Brief Narrative:  71 year old with history of hypertension, hyperlipidemia, chronic A-fib on Eliquis status post ablation, hypothyroidism GERD and anxiety presented to the ER with low hemoglobin.  Patient recently suffering from multiple abdominal symptoms ongoing nausea vomiting poor appetite and diarrhea since 7/9.   Previous hospitalization 7/9-7/17, admitted to [place] with nausea vomiting and diarrhea, CT scan consistent with right-sided colitis and pneumatosis coli in the proximal hepatic flexure.  Initially treated with IV antibiotics, changed to Augmentin and discharged home where she completed her antibiotic course.     7/24 - (current admission) -noted to have bright red blood per rectum, instructed to discontinue Eliquis in the outpatient setting but with worsening symptoms of fatigue and weakness presented to the ED for symptoms noted to be profoundly anemic requiring transfusion, GI consulted. -Underwent upper GI endoscopy and colonoscopy on 06/02/2022.    Assessment & Plan:   Acute symptomatic blood loss anemia due to GI bleed: -Suspected infectious versus ischemic colitis -Hemoglobin 6.6 at intake status post 2 unit PRBC on 05/26/2022, with no further bleeding;  -Zosyn discontinued.  Repeat CT abd/pelvis shows minimally worsening colitis and due to persistent symptoms she underwent upper GI endoscopy which shows low-grade of narrowing Schatzki ring, small hiatal hernia.  Colonoscopy shows obstructing large mass found in the hepatic flexure.  Biopsies were taken.  Result is pending. -H&H dropped from 10.1-8.7.  Continue to monitor.  Transfuse as needed.   Paroxysmal atrial fibrillation: -Currently in sinus rhythm, Eliquis on hold given above GI bleed    Essential hypertension:  -Continue metoprolol, irbesartan and Lasix   Hypothyroidism:   -Euthyroid on Synthroid replacement.   Acute hypoxic respiratory failure/atelectasis: Was requiring 2 L of oxygen yesterday, feels better, she is off of oxygen and on room air.  Continue with incentive spirometry.  Chronic insomnia/anxiety/depression: -Continue home meds ramelteon, trazodone, Effexor  GERD: Continue PPI   AKI: Resolved  Hypokalemia: Resolved  DVT prophylaxis: SCD Code Status: Full code Family Communication:  None present at bedside.  Plan of care discussed with patient in length and she verbalized understanding and agreed with it. Disposition Plan: To be determined  Consultants:  GI  Procedures:  EGD Colonoscopy  Antimicrobials:  Zosyn  Status is: Inpatient     Subjective: Patient seen and examined.  Resting comfortably on the bed.  Reports that she is feeling better.  Pain is better.  No nausea, vomiting, fever, chills.  No bleeding per rectum.  No acute events overnight.  Objective: Vitals:   06/02/22 1943 06/02/22 2338 06/03/22 0342 06/03/22 0803  BP: (!) 141/68 (!) 150/72 (!) 157/77 (!) 158/85  Pulse: 64 68 88 84  Resp: '18 18 18 20  '$ Temp: (!) 97.5 F (36.4 C) 97.9 F (36.6 C) 98 F (36.7 C) 97.9 F (36.6 C)  TempSrc: Oral Oral Oral Oral  SpO2: 99% 93% 95% 94%  Weight:      Height:        Intake/Output Summary (Last 24 hours) at 06/03/2022 3825 Last data filed at 06/03/2022 0300 Gross per 24 hour  Intake 473.75 ml  Output 400 ml  Net 73.75 ml   Filed Weights   05/26/22 1043  Weight: 111.2 kg    Examination:  General exam: Appears calm and comfortable, obese, on room air, sleepy but arousable and communicating well Respiratory system: Clear to auscultation. Respiratory effort normal. Cardiovascular  system: S1 & S2 heard, RRR. No JVD, murmurs, rubs, gallops or clicks. No pedal edema. Gastrointestinal system: Abdomen is obese, some tenderness noted.  No guarding, no rigidity.  No organomegaly or masses felt. Normal bowel sounds  heard. Central nervous system: Alert and oriented. No focal neurological deficits. Extremities: Symmetric 5 x 5 power. Skin: No rashes, lesions or ulcers Psychiatry: Judgement and insight appear normal. Mood & affect appropriate.    Data Reviewed: I have personally reviewed following labs and imaging studies  CBC: Recent Labs  Lab 05/29/22 1634 05/30/22 0600 05/30/22 1628 05/31/22 0605 06/01/22 0148 06/02/22 0259 06/03/22 0330  WBC 4.7  --   --   --   --  3.6* 4.2  HGB 8.7*   < > 9.2* 9.1* 8.3* 10.1* 8.7*  HCT 27.8*   < > 30.5* 29.8* 27.7* 32.6* 28.9*  MCV 98.9  --   --   --   --  98.5 101.4*  PLT 165  --   --   --   --  131* 217   < > = values in this interval not displayed.   Basic Metabolic Panel: Recent Labs  Lab 05/30/22 0600 05/31/22 0605 06/01/22 0148 06/02/22 0259 06/03/22 0330  NA 141 140 141 141 140  K 3.7 4.0 4.4 3.9 3.6  CL 107 106 107 104 106  CO2 '27 26 27 28 26  '$ GLUCOSE 98 101* 98 88 118*  BUN 9 7* 7* 8 6*  CREATININE 0.73 0.87 0.96 0.89 0.82  CALCIUM 8.3* 8.5* 8.5* 8.7* 8.7*   GFR: Estimated Creatinine Clearance: 75.4 mL/min (by C-G formula based on SCr of 0.82 mg/dL). Liver Function Tests: Recent Labs  Lab 05/29/22 1634  AST 40  ALT 27  ALKPHOS 78  BILITOT 0.9  PROT 5.5*  ALBUMIN 2.8*   Recent Labs  Lab 05/29/22 1634  LIPASE 25  AMYLASE 18*   No results for input(s): "AMMONIA" in the last 168 hours. Coagulation Profile: No results for input(s): "INR", "PROTIME" in the last 168 hours. Cardiac Enzymes: No results for input(s): "CKTOTAL", "CKMB", "CKMBINDEX", "TROPONINI" in the last 168 hours. BNP (last 3 results) No results for input(s): "PROBNP" in the last 8760 hours. HbA1C: No results for input(s): "HGBA1C" in the last 72 hours. CBG: No results for input(s): "GLUCAP" in the last 168 hours. Lipid Profile: No results for input(s): "CHOL", "HDL", "LDLCALC", "TRIG", "CHOLHDL", "LDLDIRECT" in the last 72 hours. Thyroid Function  Tests: No results for input(s): "TSH", "T4TOTAL", "FREET4", "T3FREE", "THYROIDAB" in the last 72 hours. Anemia Panel: No results for input(s): "VITAMINB12", "FOLATE", "FERRITIN", "TIBC", "IRON", "RETICCTPCT" in the last 72 hours. Sepsis Labs: Recent Labs  Lab 05/29/22 1634  LATICACIDVEN 2.0*    No results found for this or any previous visit (from the past 240 hour(s)).    Radiology Studies: No results found.  Scheduled Meds:  flecainide  50 mg Oral Q12H   furosemide  10 mg Oral BID   gabapentin  1,200 mg Oral QHS   icosapent Ethyl  2 g Oral BID   irbesartan  300 mg Oral Daily   levothyroxine  75 mcg Oral Q breakfast   metoprolol succinate  50 mg Oral Daily   pantoprazole  40 mg Oral Daily   ramelteon  8 mg Oral QHS   sodium chloride flush  3 mL Intravenous Q12H   traZODone  100 mg Oral QHS   venlafaxine XR  225 mg Oral Daily   Continuous Infusions:  piperacillin-tazobactam (ZOSYN)  IV 3.375 g (06/03/22 0637)     LOS: 8 days   Time spent: 35 minutes   Riku Buttery Loann Quill, MD [facility]  If 7PM-7AM, please contact night-coverage [email or web address] 06/03/2022, 9:22 AM

## 2022-06-03 NOTE — Progress Notes (Signed)
Physical Therapy Treatment Patient Details Name: [name] MRN: [id number] DOB: [birth year] Today's Date: 06/03/2022   History of Present Illness 71 yo female with onset of abd pain, N/V/D. and rectal bleeding was admitted on 7/27, MD concerned for diverticulitis vs mass. has pleural effusion and atelectasis, Hgb low with 2 units PRBC's transfused.  Holding her Eliquis.  PMHx:  hypertension, hyperlipidemia, chronic A-fib on Eliquis status post ablation, hypothyroidism GERD and anxiety    PT Comments    Pt received seated EOB on arrival and agreeable to session. Pt demonstrating slow but stable ambulation in hall without AD with min guard for safety, however distance limited by pt fatigue and pt requiring x3 standing rest breaks throughout. Increased time spent educating and discussing HHPT, DME and answering all pt and pt daughter questions with pt and pt daughter verbalizing understanding of importance of continued mobility. Pt continues to benefit from skilled PT services to progress toward functional mobility goals.    Recommendations for follow up therapy are one component of a multi-disciplinary discharge planning process, led by the attending physician.  Recommendations may be updated based on patient status, additional functional criteria and insurance authorization.  Follow Up Recommendations  Home health PT     Assistance Recommended at Discharge Frequent or constant Supervision/Assistance  Patient can return home with the following A little help with walking and/or transfers;A little help with bathing/dressing/bathroom;Assistance with cooking/housework;Direct supervision/assist for medications management;Assist for transportation;Help with stairs or ramp for entrance   Equipment Recommendations  Rolling walker (2 wheels)    Recommendations for Other Services       Precautions / Restrictions Precautions Precautions: Fall Precaution Comments: watch O2 Restrictions Weight  Bearing Restrictions: No     Mobility  Bed Mobility Overal bed mobility: Needs Assistance             General bed mobility comments: pt sitting EOB pre and post session    Transfers Overall transfer level: Needs assistance Equipment used: None Transfers: Sit to/from Stand Sit to Stand: Supervision           General transfer comment: no AD    Ambulation/Gait Ambulation/Gait assistance: Min guard Gait Distance (Feet): 175 Feet Assistive device: None Gait Pattern/deviations: Step-through pattern, Decreased stride length, Wide base of support Gait velocity: reduced     General Gait Details: pt is slow but steady without AD, x3 standing rest breaks needing secondary to fatigue   Stairs             Wheelchair Mobility    Modified Rankin (Stroke Patients Only)       Balance Overall balance assessment: Needs assistance Sitting-balance support: Feet supported Sitting balance-Leahy Scale: Fair     Standing balance support: Single extremity supported, During functional activity Standing balance-Leahy Scale: Fair                              Cognition Arousal/Alertness: Awake/alert Behavior During Therapy: WFL for tasks assessed/performed Overall Cognitive Status: Within Functional Limits for tasks assessed                                          Exercises Other Exercises Other Exercises: encouraged pt to continue ambulation and OOB throughout weekend    General Comments General comments (skin integrity, edema, etc.): extended time discussing HHPT and DME, and importance  of continued mobility, asnwered all pt and pt daughter questions      Pertinent Vitals/Pain      Home Living                          Prior Function            PT Goals (current goals can now be found in the care plan section) Acute Rehab PT Goals PT Goal Formulation: With patient Time For Goal Achievement: 06/13/22     Frequency    Min 3X/week      PT Plan      Co-evaluation              AM-PAC PT "6 Clicks" Mobility   Outcome Measure  Help needed turning from your back to your side while in a flat bed without using bedrails?: A Little Help needed moving from lying on your back to sitting on the side of a flat bed without using bedrails?: A Little Help needed moving to and from a bed to a chair (including a wheelchair)?: A Little Help needed standing up from a chair using your arms (e.g., wheelchair or bedside chair)?: A Little Help needed to walk in hospital room?: A Little Help needed climbing 3-5 steps with a railing? : A Lot 6 Click Score: 17    End of Session   Activity Tolerance: Patient tolerated treatment well;Patient limited by fatigue Patient left: in chair;with family/visitor present Nurse Communication: Mobility status PT Visit Diagnosis: Unsteadiness on feet (R26.81);Muscle weakness (generalized) (M62.81);Dizziness and giddiness (R42)     Time: 0712-1975 PT Time Calculation (min) (ACUTE ONLY): 25 min  Charges:  $Gait Training: 8-22 mins $Therapeutic Activity: 8-22 mins                     August Longest R. PTA Acute Rehabilitation Services Office: [phone]   Rutherford Limerick 06/03/2022, 4:22 PM

## 2022-06-03 NOTE — Plan of Care (Signed)

## 2022-06-03 NOTE — Progress Notes (Signed)
  Transition of Care [facility]) Screening Note   Patient Details  Name: [name] Date of Birth: [birth year]   Transition of Care [facility]) CM/SW Contact:    Dawayne Patricia, RN Phone Number: 06/03/2022, 1:54 PM    Transition of Care Department [facility]) has reviewed patient and no TOC needs have been identified at this time. Noted ongoing workup with GI and interdisciplinary team.  We will continue to monitor patient advancement through interdisciplinary progression rounds. If new patient transition needs arise, please place a TOC consult.

## 2022-06-03 NOTE — Anesthesia Postprocedure Evaluation (Signed)
Anesthesia Post Note  Patient: [name]  Procedure(s) Performed: COLONOSCOPY ESOPHAGOGASTRODUODENOSCOPY (EGD) POLYPECTOMY SUBMUCOSAL TATTOO INJECTION     Patient location during evaluation: Endoscopy Anesthesia Type: MAC Level of consciousness: awake and alert Pain management: pain level controlled Vital Signs Assessment: post-procedure vital signs reviewed and stable Respiratory status: spontaneous breathing, nonlabored ventilation and respiratory function stable Cardiovascular status: blood pressure returned to baseline and stable Postop Assessment: no apparent nausea or vomiting Anesthetic complications: no   No notable events documented.  Last Vitals:  Vitals:   06/03/22 0803 06/03/22 1104  BP: (!) 158/85   Pulse: 84   Resp: 20 (P) 20  Temp: 36.6 C (P) 36.4 C  SpO2: 94%     Last Pain:  Vitals:   06/03/22 1104  TempSrc: (P) Oral  PainSc:                  Lidia Collum

## 2022-06-03 NOTE — Plan of Care (Signed)

## 2022-06-03 NOTE — Consult Note (Signed)
[name] Dec 15, 1950  [id number].    Requesting MD: Carl Best, NP; Dr. Candis Schatz  Chief Complaint/Reason for Consult: Hepatic Flexure Mass  HPI: [name] is a 71 y.o. female with a hx of HTN, HLD, A. Fib s/p ablation on Eliquis and hypothyroidism who we were asked to see for colonic mass at her hepatic flexure noted on colonoscopy.   Patient reports 1 month ago she began having generalized abdominal pain worse on the right side with associated n/v/d. 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  She was treated with IV antibiotics and gentle diet advancement.  Discharged on Augmentin.  Reports after discharge began having some bloody BMs at home.  Reached out to her primary care provider who instructed her to stop Eliquis and obtained lab work.  She was called that her hemoglobin was 6 and she should present to the ED for evaluation. Admission hgb 6.6 requiring transfusion.  During admission she underwent a colonoscopy by GI that showed partially obstructing mass at the hepatic flexure that was only able to be transversed with ultrathin colonoscope.  No bleeding was present at the time of colonoscopy.  She additionally had a polyp in the transverse colon and the descending colon that were resected and sent to pathology. We were asked to see.   She reports her last colonoscopy before admission was in 2010.  She does have a family history of colon cancer including her father who passed away from colon cancer at the age of 53.  She denies any recent weight loss.  Was having normal BMs prior to onset of symptoms 1 month ago. Still having some generalized abdominal pain that is worse on the right. She is currently tolerating soft diet without n/v, increased abdominal pain. Passing flatus. Last bm this am and liquidy.  No prior abdominal surgeries.  Not currently on anticoagulation.  Reports  she normally lives at home with her daughter.  Reports she is only able to walk 10 feet/1 flight of stairs without becoming short of breath and has noticed increased BLE swelling recently. Last Echo at outside facility with EF 60%.  ROS: ROS As above, see HPI  Family History  Problem Relation Age of Onset   Hypertension Mother    Hypertension Father    Cancer Father        colon   Cancer Daughter        daughter   Cancer Son        skin   Cancer Maternal Aunt        breast   Hypertension Maternal Grandmother    Hypertension Maternal Grandfather    Heart attack Maternal Grandfather    Hypertension Paternal Grandmother    Stroke Paternal Grandfather    Cancer Maternal Aunt        breast    Past Medical History:  Diagnosis Date   Anxiety    Atrial fibrillation (HCC)    Depression    GERD (gastroesophageal reflux disease)    Hypertension    Plaque psoriasis    Thyroid disease     Past Surgical History:  Procedure Laterality Date   ORIF ANKLE FRACTURE      Social History:  reports that she has never smoked. She has never used smokeless tobacco. She reports that she does not drink alcohol and does not use drugs.  Allergies: No Known Allergies  Medications  Prior to Admission  Medication Sig Dispense Refill   amoxicillin-clavulanate (AUGMENTIN) 875-125 MG tablet Take 1 tablet by mouth 2 (two) times daily.     aspirin EC 81 MG tablet Take 1 tablet (81 mg total) by mouth daily. 90 tablet 3   flecainide (TAMBOCOR) 50 MG tablet Take 50 mg by mouth 2 (two) times a day.     furosemide (LASIX) 20 MG tablet Take 0.5 tablets (10 mg total) by mouth daily. 30 tablet 3   gabapentin (NEURONTIN) 600 MG tablet Take 2 tablets (1,200 mg total) by mouth at bedtime. 180 tablet 3   icosapent Ethyl (VASCEPA) 1 g capsule Take 2 capsules (2 g total) by mouth 2 (two) times daily. 360 capsule 3   levothyroxine (SYNTHROID) 75 MCG tablet Take 1 tablet (75 mcg total) by mouth daily with breakfast.  90 tablet 3   metoprolol succinate (TOPROL-XL) 50 MG 24 hr tablet TAKE ONE TABLET BY MOUTH DAILY (Patient taking differently: Take 50 mg by mouth daily.) 90 tablet 3   Multiple Vitamins-Minerals (MULTIVITAMIN WITH MINERALS) tablet Take 1 tablet by mouth daily.     ondansetron (ZOFRAN-ODT) 8 MG disintegrating tablet Take 1 tablet (8 mg total) by mouth every 8 (eight) hours as needed for nausea. 20 tablet 3   oxyCODONE-acetaminophen (PERCOCET/ROXICET) 5-325 MG tablet Take 1 tablet by mouth every 8 (eight) hours as needed. 90 tablet 0   pantoprazole (PROTONIX) 40 MG tablet TAKE ONE TABLET BY MOUTH DAILY (Patient taking differently: Take 40 mg by mouth daily.) 90 tablet 3   ramelteon (ROZEREM) 8 MG tablet 1 tablet p.o. at [place] 90 tablet 3   traZODone (DESYREL) 100 MG tablet Take 1 tablet (100 mg total) by mouth at bedtime. 90 tablet 3   valsartan-hydrochlorothiazide (DIOVAN-HCT) 320-25 MG tablet Take 0.5 tablets by mouth daily.     venlafaxine XR (EFFEXOR-XR) 75 MG 24 hr capsule Take 3 capsules (225 mg total) by mouth daily. 90 capsule 11   AMBULATORY NON FORMULARY MEDICATION Medication Name: Rollator 1 each 0   AMBULATORY NON FORMULARY MEDICATION Motorized scooter for use daily 1 each 0   AMBULATORY NON FORMULARY MEDICATION Shower seat for use daily 1 each 0     Physical Exam: Blood pressure (!) 158/85, pulse 84, temperature (P) 97.6 F (36.4 C), temperature source (P) Oral, resp. rate (P) 20, height '5\' 3"'$  (1.6 m), weight 111.2 kg, SpO2 94 %. General: pleasant, WD/WN white female who is laying in bed in NAD HEENT: head is normocephalic, atraumatic.  Sclera are noninjected.  PERRL.  Ears and nose without any masses or lesions.  Mouth is pink and moist. Dentition fair Heart: regular, rate, and rhythm.  Lungs: CTAB, no wheezes, rhonchi, or rales noted.  Respiratory effort nonlabored Abd:  Soft, mild distension, generalized ttp greater on the right without rigidity or guarding, +BS MS: 2+ BLE  swelling Skin: warm and dry with no masses, lesions, or rashes Psych: A&Ox4 with an appropriate affect Neuro: cranial nerves grossly intact, normal speech, thought process intact, gait not assessed  Results for orders placed or performed during the hospital encounter of 05/26/22 (from the past 48 hour(s))  Basic metabolic panel     Status: Abnormal   Collection Time: 06/02/22  2:59 AM  Result Value Ref Range   Sodium 141 135 - 145 mmol/L   Potassium 3.9 3.5 - 5.1 mmol/L   Chloride 104 98 - 111 mmol/L   CO2 28 22 - 32 mmol/L   Glucose, Bld 88  70 - 99 mg/dL    Comment: Glucose reference range applies only to samples taken after fasting for at least 8 hours.   BUN 8 8 - 23 mg/dL   Creatinine, Ser 0.89 0.44 - 1.00 mg/dL   Calcium 8.7 (L) 8.9 - 10.3 mg/dL   GFR, Estimated >60 >60 mL/min    Comment: (NOTE) Calculated using the CKD-EPI Creatinine Equation (2021)    Anion gap 9 5 - 15    Comment: Performed at [facility] [address], [place], [place] [address]  CBC     Status: Abnormal   Collection Time: 06/02/22  2:59 AM  Result Value Ref Range   WBC 3.6 (L) 4.0 - 10.5 K/uL   RBC 3.31 (L) 3.87 - 5.11 MIL/uL   Hemoglobin 10.1 (L) 12.0 - 15.0 g/dL   HCT 32.6 (L) 36.0 - 46.0 %   MCV 98.5 80.0 - 100.0 fL   MCH 30.5 26.0 - 34.0 pg   MCHC 31.0 30.0 - 36.0 g/dL   RDW 18.6 (H) 11.5 - 15.5 %   Platelets 131 (L) 150 - 400 K/uL   nRBC 0.0 0.0 - 0.2 %    Comment: Performed at [facility] [address], [place], [place] [address]  Basic metabolic panel     Status: Abnormal   Collection Time: 06/03/22  3:30 AM  Result Value Ref Range   Sodium 140 135 - 145 mmol/L   Potassium 3.6 3.5 - 5.1 mmol/L   Chloride 106 98 - 111 mmol/L   CO2 26 22 - 32 mmol/L   Glucose, Bld 118 (H) 70 - 99 mg/dL    Comment: Glucose reference range applies only to samples taken after fasting for at least 8 hours.   BUN 6 (L) 8 - 23 mg/dL   Creatinine, Ser 0.82 0.44 - 1.00 mg/dL   Calcium 8.7 (L)  8.9 - 10.3 mg/dL   GFR, Estimated >60 >60 mL/min    Comment: (NOTE) Calculated using the CKD-EPI Creatinine Equation (2021)    Anion gap 8 5 - 15    Comment: Performed at [facility] [address], [place], [place] [address]  CBC     Status: Abnormal   Collection Time: 06/03/22  3:30 AM  Result Value Ref Range   WBC 4.2 4.0 - 10.5 K/uL   RBC 2.85 (L) 3.87 - 5.11 MIL/uL   Hemoglobin 8.7 (L) 12.0 - 15.0 g/dL   HCT 28.9 (L) 36.0 - 46.0 %   MCV 101.4 (H) 80.0 - 100.0 fL   MCH 30.5 26.0 - 34.0 pg   MCHC 30.1 30.0 - 36.0 g/dL   RDW 18.5 (H) 11.5 - 15.5 %   Platelets 217 150 - 400 K/uL   nRBC 0.0 0.0 - 0.2 %    Comment: Performed at [facility], [place] [address] [place], [place] [address]   No results found.  Anti-infectives (From admission, onward)    Start     Dose/Rate Route Frequency Ordered Stop   05/27/22 1030  piperacillin-tazobactam (ZOSYN) IVPB 3.375 g        3.375 g 12.5 mL/hr over 240 Minutes Intravenous Every 8 hours 05/27/22 0943 06/03/22 1359       Upper Endoscopy - 06/02/22 - Dr. Dustin Flock  - The examined portions of the nasopharynx, oropharynx and larynx were normal. - Low-grade of narrowing Schatzki ring. - Small hiatal hernia.  Colonoscopy Results - 06/02/22 - Dr. 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 Biopsied. Tattooed. - One 5 mm polyp in the transverse colon, removed with a cold snare. Resected and retrieved. - One 6 mm polyp in the descending colon, removed with a cold snare. Resected and retrieved. - Diverticulosis in the sigmoid colon and in the descending colon. - The distal rectum and anal verge are normal on retroflexion view.  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 This is a 71 y.o. female who had been having bloody bm's prior to admission and presented with hgb of 6.6 requiring transfusion. She underwent  Colonoscopy on 06/02/22 that showed a partially obstructing mass at the hepatic flexure that was only able to be transversed with ultrathin colonoscope.  No bleeding was present at the time of colonoscopy.  She additionally had a polyp in the transverse colon and the descending colon that were resected and sent to pathology.  Pathology is currently pending.  CT A/P on admission did not show any obvious metastatic disease.  CT chest has been ordered and is pending.  Will add on CEA.  Given patient presented with bleeding and her colonic mass is near obstructing, feel she would benefit from surgery during admission.  Will wait until path of the mass and polyps result to determine extent of colectomy that would be needed.  In the meantime, we will ask TRH to medically optimize and see if patient needs any further work-up before undergoing general anesthesia.  She reports she cannot walk more than 10 feet or 1 flight of stairs without becoming dyspneic. We will follow along closely.  FEN - Would keep on CLD VTE - SCDs ID - None indicated from our standpoint  HTN HLD A. Fib s/p ablation on Eliquis at baseline - currently held Hypothyroidism  [name], [facility] 06/03/2022, 3:01 PM Please see Amion for pager number during day hours 7:00am-4:30pm

## 2022-06-04 ENCOUNTER — Inpatient Hospital Stay (HOSPITAL_COMMUNITY): Payer: Medicare Other

## 2022-06-04 ENCOUNTER — Encounter (HOSPITAL_COMMUNITY): Payer: Self-pay | Admitting: Gastroenterology

## 2022-06-04 DIAGNOSIS — D649 Anemia, unspecified: Secondary | ICD-10-CM | POA: Diagnosis not present

## 2022-06-04 DIAGNOSIS — R0609 Other forms of dyspnea: Secondary | ICD-10-CM | POA: Diagnosis not present

## 2022-06-04 LAB — BASIC METABOLIC PANEL
Anion gap: 6 (ref 5–15)
BUN: 6 mg/dL — ABNORMAL LOW (ref 8–23)
CO2: 31 mmol/L (ref 22–32)
Calcium: 8.9 mg/dL (ref 8.9–10.3)
Chloride: 103 mmol/L (ref 98–111)
Creatinine, Ser: 0.82 mg/dL (ref 0.44–1.00)
GFR, Estimated: >60 mL/min (ref >60)
Glucose, Bld: 91 mg/dL (ref 70–99)
Potassium: 3.6 mmol/L (ref 3.5–5.1)
Sodium: 140 mmol/L (ref 135–145)

## 2022-06-04 LAB — CBC
HCT: 28.7 % — ABNORMAL LOW (ref 36.0–46.0)
Hemoglobin: 8.8 g/dL — ABNORMAL LOW (ref 12.0–15.0)
MCH: 30.7 pg (ref 26.0–34.0)
MCHC: 30.7 g/dL (ref 30.0–36.0)
MCV: 100 fL (ref 80.0–100.0)
Platelets: 216 10*3/uL (ref 150–400)
RBC: 2.87 MIL/uL — ABNORMAL LOW (ref 3.87–5.11)
RDW: 18 % — ABNORMAL HIGH (ref 11.5–15.5)
WBC: 4.1 10*3/uL (ref 4.0–10.5)
nRBC: 0 % (ref 0.0–0.2)

## 2022-06-04 LAB — CEA: CEA: 0.9 ng/mL (ref 0.0–4.7)

## 2022-06-04 MED ORDER — PERFLUTREN LIPID MICROSPHERE
1.0000 mL | INTRAVENOUS | Status: AC | PRN
  Administered 2022-06-04: 3 mL via INTRAVENOUS

## 2022-06-04 NOTE — Progress Notes (Signed)
  Echocardiogram 2D Echocardiogram has been performed.  [name] 06/04/2022, 5:41 PM

## 2022-06-04 NOTE — Progress Notes (Signed)
PROGRESS NOTE    [name]  VVO:[id number] DOB: Nov 08, 1950 DOA: 05/26/2022 PCP: Silverio Decamp, MD   Brief Narrative:  71 year old with history of hypertension, hyperlipidemia, chronic A-fib on Eliquis status post ablation, hypothyroidism GERD and anxiety presented to the ER with low hemoglobin.  Patient recently suffering from multiple abdominal symptoms ongoing nausea vomiting poor appetite and diarrhea since 7/9.   Previous hospitalization 7/9-7/17, admitted to 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  Initially treated with IV antibiotics, changed to Augmentin and discharged home where she completed her antibiotic course.     7/24 - (current admission) -noted to have bright red blood per rectum, instructed to discontinue Eliquis in the outpatient setting but with worsening symptoms of fatigue and weakness presented to the ED for symptoms noted to be profoundly anemic requiring transfusion, GI consulted. -Underwent upper GI endoscopy and colonoscopy on 06/02/2022.    Assessment & Plan:   Acute symptomatic blood loss anemia due to GI bleed: -Suspected infectious versus ischemic colitis -Hemoglobin 6.6 at intake status post 2 unit PRBC on 05/26/2022, with no further bleeding;   Repeat CT abd/pelvis shows minimally worsening colitis and due to persistent symptoms she underwent upper GI endoscopy which shows low-grade of narrowing Schatzki ring, small hiatal hernia.  Colonoscopy shows obstructing large mass found in the hepatic flexure.  Zosyn discontinued.  Biopsies were taken.  Result is pending.  General surgery consulted.  CT chest ruled out metastatic disease.  General surgery plans for colectomy but waiting for final path report of the polyps.  Potential surgery is scheduled for Tuesday.  Patient aware. -H&H dropped from 10.1-8.7.  Continue to monitor.  Transfuse  as needed.   Paroxysmal atrial fibrillation: -Currently in sinus rhythm, Eliquis on hold given above GI bleed    Essential hypertension:  -Continue metoprolol, irbesartan and Lasix   Hypothyroidism:  -Euthyroid on Synthroid replacement.   Acute hypoxic respiratory failure/atelectasis: Was requiring 2 L of oxygen yesterday, feels better, she is off of oxygen and on room air.  Continue with incentive spirometry.  Chronic insomnia/anxiety/depression: -Continue home meds ramelteon, trazodone, Effexor  GERD: Continue PPI  AKI: Resolved  Hypokalemia: Resolved  DVT prophylaxis: SCD Code Status: Full code Family Communication:  None present at bedside.  Plan of care discussed with patient in length and she verbalized understanding and agreed with it. Disposition Plan: Might need SNF postsurgery.  Consultants:  GI  Procedures:  EGD Colonoscopy  Antimicrobials:  Zosyn  Status is: Inpatient     Subjective:  Patient seen and examined.  Complains of right lower quadrant pain but that is improving and controlled with the pain medication.  No other complaint.  Objective: Vitals:   06/03/22 2213 06/03/22 2327 06/04/22 0352 06/04/22 0745  BP: (!) 149/75 (!) 172/77 (!) 149/53 136/68  Pulse:  80 65 98  Resp:  '19 20 16  '$ Temp:  97.6 F (36.4 C) (!) 97.4 F (36.3 C) 97.8 F (36.6 C)  TempSrc:  Oral Oral Oral  SpO2:  96% 90% 97%  Weight:      Height:        Intake/Output Summary (Last 24 hours) at 06/04/2022 1055 Last data filed at 06/03/2022 1100 Gross per 24 hour  Intake --  Output 400 ml  Net -400 ml    Filed Weights   05/26/22 1043  Weight: 111.2 kg    Examination:  General exam: Appears  calm and comfortable, obese Respiratory system: Clear to auscultation. Respiratory effort normal. Cardiovascular system: S1 & S2 heard, RRR. No JVD, murmurs, rubs, gallops or clicks. No pedal edema. Gastrointestinal system: Abdomen is nondistended, soft and tender at right  lower quadrant. No organomegaly or masses felt. Normal bowel sounds heard. Central nervous system: Alert and oriented. No focal neurological deficits. Extremities: Symmetric 5 x 5 power. Skin: No rashes, lesions or ulcers.  Psychiatry: Judgement and insight appear normal. Mood & affect appropriate.   Data Reviewed: I have personally reviewed following labs and imaging studies  CBC: Recent Labs  Lab 05/29/22 1634 05/30/22 0600 05/31/22 0605 06/01/22 0148 06/02/22 0259 06/03/22 0330 06/04/22 0200  WBC 4.7  --   --   --  3.6* 4.2 4.1  HGB 8.7*   < > 9.1* 8.3* 10.1* 8.7* 8.8*  HCT 27.8*   < > 29.8* 27.7* 32.6* 28.9* 28.7*  MCV 98.9  --   --   --  98.5 101.4* 100.0  PLT 165  --   --   --  131* 217 216   < > = values in this interval not displayed.    Basic Metabolic Panel: Recent Labs  Lab 05/31/22 0605 06/01/22 0148 06/02/22 0259 06/03/22 0330 06/04/22 0200  NA 140 141 141 140 140  K 4.0 4.4 3.9 3.6 3.6  CL 106 107 104 106 103  CO2 '26 27 28 26 31  '$ GLUCOSE 101* 98 88 118* 91  BUN 7* 7* 8 6* 6*  CREATININE 0.87 0.96 0.89 0.82 0.82  CALCIUM 8.5* 8.5* 8.7* 8.7* 8.9    GFR: Estimated Creatinine Clearance: 75.4 mL/min (by C-G formula based on SCr of 0.82 mg/dL). Liver Function Tests: Recent Labs  Lab 05/29/22 1634  AST 40  ALT 27  ALKPHOS 78  BILITOT 0.9  PROT 5.5*  ALBUMIN 2.8*    Recent Labs  Lab 05/29/22 1634  LIPASE 25  AMYLASE 18*    No results for input(s): "AMMONIA" in the last 168 hours. Coagulation Profile: No results for input(s): "INR", "PROTIME" in the last 168 hours. Cardiac Enzymes: No results for input(s): "CKTOTAL", "CKMB", "CKMBINDEX", "TROPONINI" in the last 168 hours. BNP (last 3 results) No results for input(s): "PROBNP" in the last 8760 hours. HbA1C: No results for input(s): "HGBA1C" in the last 72 hours. CBG: No results for input(s): "GLUCAP" in the last 168 hours. Lipid Profile: No results for input(s): "CHOL", "HDL",  "LDLCALC", "TRIG", "CHOLHDL", "LDLDIRECT" in the last 72 hours. Thyroid Function Tests: No results for input(s): "TSH", "T4TOTAL", "FREET4", "T3FREE", "THYROIDAB" in the last 72 hours. Anemia Panel: No results for input(s): "VITAMINB12", "FOLATE", "FERRITIN", "TIBC", "IRON", "RETICCTPCT" in the last 72 hours. Sepsis Labs: Recent Labs  Lab 05/29/22 1634  LATICACIDVEN 2.0*     No results found for this or any previous visit (from the past 240 hour(s)).    Radiology Studies: CT CHEST W CONTRAST  Result Date: 06/03/2022 CLINICAL DATA:  CT chest for staging colon cancer EXAM: CT CHEST WITH CONTRAST TECHNIQUE: Multidetector CT imaging of the chest was performed during intravenous contrast administration. RADIATION DOSE REDUCTION: This exam was performed according to the departmental dose-optimization program which includes automated exposure control, adjustment of the mA and/or kV according to patient size and/or use of iterative reconstruction technique. CONTRAST:  139m OMNIPAQUE IOHEXOL 300 MG/ML  SOLN COMPARISON:  None Available. FINDINGS: Cardiovascular: Cardiomegaly. No pericardial effusion. Coronary artery and aortic atherosclerotic calcification. Mediastinum/Nodes: Subcentimeter precarinal nodes are favored reactive. Normal esophagus. Central  airways patent. Lungs/Pleura: Small bilateral pleural effusions and associated atelectasis. Mild diffuse bronchial wall thickening. Patchy air trapping in the upper lungs. Upper Abdomen: Hepatic steatosis. No acute abnormality in the upper abdomen. Musculoskeletal: No chest wall abnormality. No acute or significant osseous findings. IMPRESSION: 1. Small bilateral pleural effusions and associated atelectasis. 2. No evidence of metastatic disease in the chest. Electronically Signed   By: Placido Sou M.D.   On: 06/03/2022 21:53    Scheduled Meds:  flecainide  50 mg Oral Q12H   furosemide  10 mg Oral BID   gabapentin  1,200 mg Oral QHS   icosapent  Ethyl  2 g Oral BID   irbesartan  300 mg Oral Daily   levothyroxine  75 mcg Oral Q breakfast   metoprolol succinate  50 mg Oral Daily   pantoprazole  40 mg Oral Daily   ramelteon  8 mg Oral QHS   sodium chloride flush  3 mL Intravenous Q12H   traZODone  100 mg Oral QHS   venlafaxine XR  225 mg Oral Daily   Continuous Infusions:   LOS: 9 days    Darliss Cheney, MD [facility]  If 7PM-7AM, please contact night-coverage [email or web address] 06/04/2022, 10:55 AM

## 2022-06-04 NOTE — Progress Notes (Addendum)
Pt has been evaluated by PT. Pt could not walk more than 10 feet or 1 flight of stairs without becoming dyspneic; echocardiogram is pending. PT is recommending HHPT and DME (RW).   s/p colonoscopy 06/02/22 w/ hepatic flexure mass, polyp in transverse and descending colon. Awaiting for pathology. CT chest w/ out evidence of metastasis, small bilateral pleural effusions. Will need surgical resection of mass, await pathology results in order to complete surgical planning.   Sx is pending. We'll continue to f/u to assist with the D/C plan and f/u with PT recommendations after surgery.

## 2022-06-04 NOTE — Progress Notes (Signed)
Mobility Specialist Progress Note:   06/04/22 1454  Mobility  Activity Ambulated with assistance in hallway  Level of Assistance Standby assist, set-up cues, supervision of patient - no hands on  Assistive Device  (hall rails)  Distance Ambulated (ft) 150 ft  Activity Response Tolerated well  $Mobility charge 1 Mobility   Pt received in bed willing to participate in mobility. No complaints of pain. Left in bed with call bell in reach and all needs met.   [facility] Symantha Steeber Mobility Specialist

## 2022-06-04 NOTE — Progress Notes (Addendum)
[facility] Progress Note  2 Days Post-Op  Subjective: CC:  C/o R sided abd discomfort, making it hard to take a deep breath. Reports less abd distention compared to when she presented. Reports a loose stool last night that was non-bloody. Denies nausea or vomiting.   Objective: Vital signs in last 24 hours: Temp:  [97.4 F (36.3 C)-97.8 F (36.6 C)] 97.8 F (36.6 C) (08/05 0745) Pulse Rate:  [65-98] 98 (08/05 0745) Resp:  [16-20] 16 (08/05 0745) BP: (136-172)/(53-93) 136/68 (08/05 0745) SpO2:  [90 %-97 %] 97 % (08/05 0745) Last BM Date : 06/03/22  Intake/Output from previous day: 08/04 0701 - 08/05 0700 In: -  Out: 400 [Urine:400] Intake/Output this shift: No intake/output data recorded.  PE: Gen:  Alert, NAD, appears uncomfortable Card:  Regular rate and rhythm Pulm:  slightly labored ORA Abd: Soft, mild distention, especially in epigastric region. Subjective tenderness to palpation R hemi-abdomen/RLQ without guarding or rebound tenderness. No hernias. Skin: warm and dry, no rashes  Psych: A&Ox3   Lab Results:  Recent Labs    06/03/22 0330 06/04/22 0200  WBC 4.2 4.1  HGB 8.7* 8.8*  HCT 28.9* 28.7*  PLT 217 216   BMET Recent Labs    06/03/22 0330 06/04/22 0200  NA 140 140  K 3.6 3.6  CL 106 103  CO2 26 31  GLUCOSE 118* 91  BUN 6* 6*  CREATININE 0.82 0.82  CALCIUM 8.7* 8.9   PT/INR No results for input(s): "LABPROT", "INR" in the last 72 hours. CMP     Component Value Date/Time   NA 140 06/04/2022 0200   K 3.6 06/04/2022 0200   CL 103 06/04/2022 0200   CO2 31 06/04/2022 0200   GLUCOSE 91 06/04/2022 0200   BUN 6 (L) 06/04/2022 0200   CREATININE 0.82 06/04/2022 0200   CREATININE 0.85 05/25/2022 0000   CALCIUM 8.9 06/04/2022 0200   PROT 5.5 (L) 05/29/2022 1634   ALBUMIN 2.8 (L) 05/29/2022 1634   AST 40 05/29/2022 1634   ALT 27 05/29/2022 1634   ALKPHOS 78 05/29/2022 1634   BILITOT 0.9 05/29/2022 1634   GFRNONAA >60 06/04/2022  0200   GFRNONAA 64 03/11/2020 1202   GFRAA 74 03/11/2020 1202   Lipase     Component Value Date/Time   LIPASE 25 05/29/2022 1634       Studies/Results: CT CHEST W CONTRAST  Result Date: 06/03/2022 CLINICAL DATA:  CT chest for staging colon cancer EXAM: CT CHEST WITH CONTRAST TECHNIQUE: Multidetector CT imaging of the chest was performed during intravenous contrast administration. RADIATION DOSE REDUCTION: This exam was performed according to the departmental dose-optimization program which includes automated exposure control, adjustment of the mA and/or kV according to patient size and/or use of iterative reconstruction technique. CONTRAST:  17m OMNIPAQUE IOHEXOL 300 MG/ML  SOLN COMPARISON:  None Available. FINDINGS: Cardiovascular: Cardiomegaly. No pericardial effusion. Coronary artery and aortic atherosclerotic calcification. Mediastinum/Nodes: Subcentimeter precarinal nodes are favored reactive. Normal esophagus. Central airways patent. Lungs/Pleura: Small bilateral pleural effusions and associated atelectasis. Mild diffuse bronchial wall thickening. Patchy air trapping in the upper lungs. Upper Abdomen: Hepatic steatosis. No acute abnormality in the upper abdomen. Musculoskeletal: No chest wall abnormality. No acute or significant osseous findings. IMPRESSION: 1. Small bilateral pleural effusions and associated atelectasis. 2. No evidence of metastatic disease in the chest. Electronically Signed   By: TPlacido SouM.D.   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  - will need surgical resection of mass, await pathology results in order to complete surgical planning.   ABL anemia - hgb 6.6 on admission, s/p 2u pRBC 05/26/22. Hgb 8.8 today, stable.   FEN - Would keep on CLD VTE - SCDs, hgb stable - ok for chemical DVT ppx  ID - None indicated from our standpoint   HTN HLD A. Fib s/p ablation on Eliquis at baseline - currently held Hypothyroidism    LOS: 9 days   I reviewed nursing notes, Consultant GI notes, hospitalist notes, last 24 h vitals and pain scores, last 48 h intake and output, last 24 h labs and trends, and last 24 h imaging results.  Obie Dredge, PA-C [facility] Please see Amion for pager number during day hours 7:00am-4:30pm

## 2022-06-04 NOTE — Progress Notes (Signed)
Occupational Therapy Treatment Patient Details Name: [name] MRN: [id number] DOB: 07-16-1951 Today's Date: 06/04/2022   History of present illness 71 yo female with onset of abd pain, N/V/D. and rectal bleeding was admitted on 7/27, MD concerned for diverticulitis vs mass. has pleural effusion and atelectasis, Hgb low with 2 units PRBC's transfused.  Holding her Eliquis.  PMHx:  hypertension, hyperlipidemia, chronic A-fib on Eliquis status post ablation, hypothyroidism GERD and anxiety   OT comments  Pt. Seen for skilled OT treatment session. Declined eob/oob secondary to reports of fatigue.  Agreeable to education and review of energy conservation strategies.  Also answered questions and provided possible solutions for peri-care with a/e options.  Pt. Receptive and eager to review packet that was provided to her.     Recommendations for follow up therapy are one component of a multi-disciplinary discharge planning process, led by the attending physician.  Recommendations may be updated based on patient status, additional functional criteria and insurance authorization.    Follow Up Recommendations  Home health OT    Assistance Recommended at Discharge Set up Supervision/Assistance  Patient can return home with the following  A little help with walking and/or transfers;A little help with bathing/dressing/bathroom;Assistance with cooking/housework;Direct supervision/assist for medications management;Direct supervision/assist for financial management;Assist for transportation;Help with stairs or ramp for entrance   Equipment Recommendations  BSC/3in1    Recommendations for Other Services      Precautions / Restrictions Precautions Precautions: Fall Restrictions Weight Bearing Restrictions: No       Mobility Bed Mobility                    Transfers                         Balance                                           ADL either  performed or assessed with clinical judgement   ADL Overall ADL's : Needs assistance/impaired                                       General ADL Comments: pt. reports feeling very tired and "weak like a wet noodle" declined all efforts for oob but reports she has been ambulating to/from b.room. focused on energy conservation and education of a/e for toileting needs. pt. states she had ordered something to help her with clean up after BM but states "its not the best" reviewed spaghetti tongues and provided information on those along with other a/e available.  pt. states when her stomach isnt as "swollen" she can reach her feet and does not feel she would need a/e for lb dressing    Extremity/Trunk Assessment              Vision       Perception     Praxis      Cognition Arousal/Alertness: Awake/alert Behavior During Therapy: WFL for tasks assessed/performed Overall Cognitive Status: Within Functional Limits for tasks assessed                                          Exercises  Shoulder Instructions       General Comments      Pertinent Vitals/ Pain       Pain Assessment Pain Assessment: No/denies pain  Home Living                                          Prior Functioning/Environment              Frequency  Min 2X/week        Progress Toward Goals  OT Goals(current goals can now be found in the care plan section)  Progress towards OT goals: Progressing toward goals     Plan Discharge plan remains appropriate    Co-evaluation                 AM-PAC OT "6 Clicks" Daily Activity     Outcome Measure   Help from another person eating meals?: None Help from another person taking care of personal grooming?: A Little Help from another person toileting, which includes using toliet, bedpan, or urinal?: A Little Help from another person bathing (including washing, rinsing, drying)?: A  Little Help from another person to put on and taking off regular upper body clothing?: None Help from another person to put on and taking off regular lower body clothing?: A Little 6 Click Score: 20    End of Session    OT Visit Diagnosis: Unsteadiness on feet (R26.81)   Activity Tolerance Patient limited by fatigue   Patient Left in chair;with call bell/phone within reach   Nurse Communication          Time: 1287-8676 OT Time Calculation (min): 14 min  Charges: OT General Charges $OT Visit: 1 Visit OT Treatments $Self Care/Home Management : 8-22 mins  Sonia Baller, COTA/L Acute Rehabilitation [phone]   Tanya Nones 06/04/2022, 11:52 AM

## 2022-06-05 DIAGNOSIS — D649 Anemia, unspecified: Secondary | ICD-10-CM | POA: Diagnosis not present

## 2022-06-05 LAB — ECHOCARDIOGRAM COMPLETE
AR max vel: 2.08 cm2
AV Area VTI: 2.1 cm2
AV Area mean vel: 2.05 cm2
AV Mean grad: 4 mmHg
AV Peak grad: 8.8 mmHg
Ao pk vel: 1.48 m/s
Area-P 1/2: 2.62 cm2
Height: 63 in
S' Lateral: 2.5 cm
Weight: 3922.42 oz

## 2022-06-05 MED ORDER — FUROSEMIDE 20 MG PO TABS
20.0000 mg | ORAL_TABLET | Freq: Two times a day (BID) | ORAL | Status: DC
  Administered 2022-06-05 – 2022-06-11 (×11): 20 mg via ORAL
  Filled 2022-06-05 (×13): qty 1

## 2022-06-05 NOTE — Progress Notes (Addendum)
PROGRESS NOTE    [name]  HMC:[id number] DOB: Feb 21, 1951 DOA: 05/26/2022 PCP: Silverio Decamp, MD   Brief Narrative:  71 year old with history of hypertension, hyperlipidemia, chronic A-fib on Eliquis status post ablation, hypothyroidism GERD and anxiety presented to the ER with low hemoglobin.  Patient recently suffering from multiple abdominal symptoms ongoing nausea vomiting poor appetite and diarrhea since 7/9.   Previous hospitalization 7/9-7/17, admitted to [place] with nausea vomiting and diarrhea, CT scan consistent with right-sided colitis and pneumatosis coli in the proximal hepatic flexure.  Initially treated with IV antibiotics, changed to Augmentin and discharged home where she completed her antibiotic course.     7/24 - (current admission) -noted to have bright red blood per rectum, instructed to discontinue Eliquis in the outpatient setting but with worsening symptoms of fatigue and weakness presented to the ED for symptoms noted to be profoundly anemic requiring transfusion, GI consulted. -Underwent upper GI endoscopy and colonoscopy on 06/02/2022.    Assessment & Plan:   Acute symptomatic blood loss anemia due to GI bleed: -Suspected infectious versus ischemic colitis -Hemoglobin 6.6 at intake status post 2 unit PRBC on 05/26/2022, with no further bleeding;   Repeat CT abd/pelvis shows minimally worsening colitis and due to persistent symptoms she underwent upper GI endoscopy which shows low-grade of narrowing Schatzki ring, small hiatal hernia.  Colonoscopy shows obstructing large mass found in the hepatic flexure.  Zosyn discontinued.  Biopsies were taken.  Result is pending.  General surgery consulted.  CT chest ruled out metastatic disease.  General surgery plans for colectomy but waiting for final path report of the polyps.  Potential surgery is scheduled for Tuesday.  Patient aware. -H&H dropped from 10.1-8.7.  Continue to monitor.  Transfuse  as needed.   Paroxysmal atrial fibrillation: -Currently in sinus rhythm, Eliquis on hold given above GI bleed and now patient is scheduled for potential surgery on Tuesday so we will continue to hold anticoagulation for now.   Essential hypertension:  -Continue metoprolol, irbesartan and Lasix   Hypothyroidism:  -Euthyroid on Synthroid replacement.   Acute hypoxic respiratory failure/atelectasis: Was requiring 2 L of oxygen yesterday, feels better, she is off of oxy all gen and on room air.  Continue with incentive spirometry.  Chronic insomnia/anxiety/depression: -Continue home meds ramelteon, trazodone, Effexor  GERD: Continue PPI  AKI: Resolved  Hypokalemia: Resolved  Rosacea?:  Patient complains of redness around cheeks, she tells me that it comes and goes and it is not itchy.  Appears like rosacea, could have been exacerbated due to her emotional distress.  We will monitor for now.  Bilateral lower extremity edema: Likely chronic.  She is on Lasix 10 mg p.o. twice daily at home and that is continued here, I will increase that to 20 mg p.o. twice daily.  No history of systolic or diastolic CHF.  DVT prophylaxis: SCD Code Status: Full code Family Communication:  None present at bedside.  Plan of care discussed with patient in length and she verbalized understanding and agreed with it. Disposition Plan: Might need SNF postsurgery.  Consultants:  GI  Procedures:  EGD Colonoscopy  Antimicrobials:  Zosyn  Status is: Inpatient     Subjective:  Patient seen and examined. Pain is improving.  She complains of redness on the cheeks.  No other complaint.  Objective: Vitals:   06/04/22 2039 06/04/22 2344 06/05/22 0402 06/05/22 0900  BP: (!) 145/75 122/69 (!) 122/56 (!) 154/64  Pulse: (!) 56 (!) 57 89 89  Resp: '18 18 18 20  '$ Temp: 98.4 F (36.9 C) (!) 97.4 F (36.3 C) 97.6 F (36.4 C) 98 F (36.7 C)  TempSrc: Oral Oral Oral Oral  SpO2: 90% 91% 97% 97%  Weight:       Height:        Intake/Output Summary (Last 24 hours) at 06/05/2022 1119 Last data filed at 06/05/2022 0403 Gross per 24 hour  Intake --  Output 0 ml  Net 0 ml    Filed Weights   05/26/22 1043  Weight: 111.2 kg    Examination:  General exam: Appears calm and comfortable, obese Respiratory system: Clear to auscultation. Respiratory effort normal. Cardiovascular system: S1 & S2 heard, RRR. No JVD, murmurs, rubs, gallops or clicks. No pedal edema. Gastrointestinal system: Abdomen is nondistended, soft and right lower quadrant tenderness. No organomegaly or masses felt. Normal bowel sounds heard. Central nervous system: Alert and oriented. No focal neurological deficits. Extremities: Symmetric 5 x 5 power. Skin: No rashes, lesions or ulcers.  Psychiatry: Judgement and insight appear normal. Mood & affect appropriate.    Data Reviewed: I have personally reviewed following labs and imaging studies  CBC: Recent Labs  Lab 05/29/22 1634 05/30/22 0600 05/31/22 0605 06/01/22 0148 06/02/22 0259 06/03/22 0330 06/04/22 0200  WBC 4.7  --   --   --  3.6* 4.2 4.1  HGB 8.7*   < > 9.1* 8.3* 10.1* 8.7* 8.8*  HCT 27.8*   < > 29.8* 27.7* 32.6* 28.9* 28.7*  MCV 98.9  --   --   --  98.5 101.4* 100.0  PLT 165  --   --   --  131* 217 216   < > = values in this interval not displayed.    Basic Metabolic Panel: Recent Labs  Lab 05/31/22 0605 06/01/22 0148 06/02/22 0259 06/03/22 0330 06/04/22 0200  NA 140 141 141 140 140  K 4.0 4.4 3.9 3.6 3.6  CL 106 107 104 106 103  CO2 '26 27 28 26 31  '$ GLUCOSE 101* 98 88 118* 91  BUN 7* 7* 8 6* 6*  CREATININE 0.87 0.96 0.89 0.82 0.82  CALCIUM 8.5* 8.5* 8.7* 8.7* 8.9    GFR: Estimated Creatinine Clearance: 75.4 mL/min (by C-G formula based on SCr of 0.82 mg/dL). Liver Function Tests: Recent Labs  Lab 05/29/22 1634  AST 40  ALT 27  ALKPHOS 78  BILITOT 0.9  PROT 5.5*  ALBUMIN 2.8*    Recent Labs  Lab 05/29/22 1634  LIPASE 25   AMYLASE 18*    No results for input(s): "AMMONIA" in the last 168 hours. Coagulation Profile: No results for input(s): "INR", "PROTIME" in the last 168 hours. Cardiac Enzymes: No results for input(s): "CKTOTAL", "CKMB", "CKMBINDEX", "TROPONINI" in the last 168 hours. BNP (last 3 results) No results for input(s): "PROBNP" in the last 8760 hours. HbA1C: No results for input(s): "HGBA1C" in the last 72 hours. CBG: No results for input(s): "GLUCAP" in the last 168 hours. Lipid Profile: No results for input(s): "CHOL", "HDL", "LDLCALC", "TRIG", "CHOLHDL", "LDLDIRECT" in the last 72 hours. Thyroid Function Tests: No results for input(s): "TSH", "T4TOTAL", "FREET4", "T3FREE", "THYROIDAB" in the last 72 hours. Anemia Panel: No results for input(s): "VITAMINB12", "FOLATE", "FERRITIN", "TIBC", "IRON", "RETICCTPCT" in the last 72 hours. Sepsis Labs: Recent Labs  Lab 05/29/22 1634  LATICACIDVEN 2.0*     No results found for this or any previous visit (from the past 240 hour(s)).    Radiology Studies: ECHOCARDIOGRAM COMPLETE  Result Date: 06/05/2022    ECHOCARDIOGRAM REPORT   Patient Name:   [name] Date of Exam: 06/04/2022 Medical Rec #:  [id number]    Height:       63.0 in Accession #:    [id number]   Weight:       245.1 lb Date of Birth:  05/22/1951   BSA:          2.108 m Patient Age:    48 years     BP:           125/61 mmHg Patient Gender: F            HR:           57 bpm. Exam Location:  Inpatient Procedure: 2D Echo, Cardiac Doppler, Color Doppler and Intracardiac            Opacification Agent Indications:    Dyspnea  History:        Patient has no prior history of Echocardiogram examinations.                 Arrythmias:Atrial Fibrillation; Risk Factors:Hypertension and                 Dyslipidemia. GERD. AKI.  Sonographer:    Clayton Lefort RDCS (AE) Referring Phys: Michell Heinrich [facility]  Sonographer Comments: Patient is morbidly obese. Image acquisition challenging due to patient body  habitus. IMPRESSIONS  1. Left ventricular ejection fraction, by estimation, is 60 to 65%. The left ventricle has normal function. The left ventricle has no regional wall motion abnormalities. There is mild left ventricular hypertrophy. Left ventricular diastolic parameters were normal. There is the interventricular septum is flattened in diastole ('D' shaped left ventricle), consistent with right ventricular volume overload.  2. Right ventricular systolic function is normal. The right ventricular size is moderately enlarged. There is mildly elevated pulmonary artery systolic pressure. The estimated right ventricular systolic pressure is 74.1 mmHg.  3. The mitral valve is normal in structure. Mild mitral valve regurgitation. No evidence of mitral stenosis.  4. Tricuspid valve regurgitation is moderate.  5. The aortic valve is grossly normal. There is mild calcification of the aortic valve. Aortic valve regurgitation is not visualized. No aortic stenosis is present.  6. The inferior vena cava is dilated in size with <50% respiratory variability, suggesting right atrial pressure of 15 mmHg. FINDINGS  Left Ventricle: Left ventricular ejection fraction, by estimation, is 60 to 65%. The left ventricle has normal function. The left ventricle has no regional wall motion abnormalities. Definity contrast agent was given IV to delineate the left ventricular  endocardial borders. The left ventricular internal cavity size was normal in size. There is mild left ventricular hypertrophy. The interventricular septum is flattened in diastole ('D' shaped left ventricle), consistent with right ventricular volume overload. Left ventricular diastolic parameters were normal. Right Ventricle: The right ventricular size is moderately enlarged. No increase in right ventricular wall thickness. Right ventricular systolic function is normal. There is mildly elevated pulmonary artery systolic pressure. The tricuspid regurgitant velocity is 2.72  m/s, and with an assumed right atrial pressure of 15 mmHg, the estimated right ventricular systolic pressure is 28.7 mmHg. Left Atrium: Left atrial size was normal in size. Right Atrium: Right atrial size was normal in size. Pericardium: Trivial pericardial effusion is present. Mitral Valve: The mitral valve is normal in structure. Mild mitral valve regurgitation. No evidence of mitral valve stenosis. Tricuspid Valve: The tricuspid valve is normal in structure. Tricuspid valve  regurgitation is moderate . No evidence of tricuspid stenosis. Aortic Valve: The aortic valve is grossly normal. There is mild calcification of the aortic valve. Aortic valve regurgitation is not visualized. No aortic stenosis is present. Aortic valve mean gradient measures 4.0 mmHg. Aortic valve peak gradient measures 8.8 mmHg. Aortic valve area, by VTI measures 2.10 cm. Pulmonic Valve: The pulmonic valve was normal in structure. Pulmonic valve regurgitation is trivial. No evidence of pulmonic stenosis. Aorta: The aortic root is normal in size and structure. Venous: The inferior vena cava is dilated in size with less than 50% respiratory variability, suggesting right atrial pressure of 15 mmHg. IAS/Shunts: The interatrial septum was not well visualized.  LEFT VENTRICLE PLAX 2D LVIDd:         4.10 cm   Diastology LVIDs:         2.50 cm   LV e' medial:    8.16 cm/s LV PW:         1.20 cm   LV E/e' medial:  14.7 LV IVS:        1.10 cm   LV e' lateral:   8.27 cm/s LVOT diam:     2.00 cm   LV E/e' lateral: 14.5 LV SV:         69 LV SV Index:   33 LVOT Area:     3.14 cm  RIGHT VENTRICLE             IVC RV Basal diam:  3.40 cm     IVC diam: 2.20 cm RV S prime:     11.60 cm/s TAPSE (M-mode): 2.4 cm LEFT ATRIUM             Index        RIGHT ATRIUM           Index LA diam:        3.70 cm 1.75 cm/m   RA Area:     17.80 cm LA Vol (A2C):   48.7 ml 23.10 ml/m  RA Volume:   50.90 ml  24.14 ml/m LA Vol (A4C):   72.7 ml 34.48 ml/m LA Biplane Vol: 66.0  ml 31.30 ml/m  AORTIC VALVE AV Area (Vmax):    2.08 cm AV Area (Vmean):   2.05 cm AV Area (VTI):     2.10 cm AV Vmax:           148.00 cm/s AV Vmean:          94.400 cm/s AV VTI:            0.327 m AV Peak Grad:      8.8 mmHg AV Mean Grad:      4.0 mmHg LVOT Vmax:         98.00 cm/s LVOT Vmean:        61.600 cm/s LVOT VTI:          0.219 m LVOT/AV VTI ratio: 0.67  AORTA Ao Root diam: 3.20 cm Ao Asc diam:  3.50 cm MITRAL VALVE                TRICUSPID VALVE MV Area (PHT): 2.62 cm     TR Peak grad:   29.6 mmHg MV Decel Time: 290 msec     TR Vmax:        272.00 cm/s MV E velocity: 120.00 cm/s MV A velocity: 40.00 cm/s   SHUNTS MV E/A ratio:  3.00         Systemic VTI:  0.22 m  Systemic Diam: 2.00 cm Cherlynn Kaiser MD Electronically signed by Cherlynn Kaiser MD Signature Date/Time: 06/05/2022/6:42:10 AM    Final    CT CHEST W CONTRAST  Result Date: 06/03/2022 CLINICAL DATA:  CT chest for staging colon cancer EXAM: CT CHEST WITH CONTRAST TECHNIQUE: Multidetector CT imaging of the chest was performed during intravenous contrast administration. RADIATION DOSE REDUCTION: This exam was performed according to the departmental dose-optimization program which includes automated exposure control, adjustment of the mA and/or kV according to patient size and/or use of iterative reconstruction technique. CONTRAST:  145m OMNIPAQUE IOHEXOL 300 MG/ML  SOLN COMPARISON:  None Available. FINDINGS: Cardiovascular: Cardiomegaly. No pericardial effusion. Coronary artery and aortic atherosclerotic calcification. Mediastinum/Nodes: Subcentimeter precarinal nodes are favored reactive. Normal esophagus. Central airways patent. Lungs/Pleura: Small bilateral pleural effusions and associated atelectasis. Mild diffuse bronchial wall thickening. Patchy air trapping in the upper lungs. Upper Abdomen: Hepatic steatosis. No acute abnormality in the upper abdomen. Musculoskeletal: No chest wall abnormality. No acute or  significant osseous findings. IMPRESSION: 1. Small bilateral pleural effusions and associated atelectasis. 2. No evidence of metastatic disease in the chest. Electronically Signed   By: TPlacido SouM.D.   On: 06/03/2022 21:53    Scheduled Meds:  flecainide  50 mg Oral Q12H   furosemide  10 mg Oral BID   gabapentin  1,200 mg Oral QHS   icosapent Ethyl  2 g Oral BID   irbesartan  300 mg Oral Daily   levothyroxine  75 mcg Oral Q breakfast   metoprolol succinate  50 mg Oral Daily   pantoprazole  40 mg Oral Daily   ramelteon  8 mg Oral QHS   sodium chloride flush  3 mL Intravenous Q12H   traZODone  100 mg Oral QHS   venlafaxine XR  225 mg Oral Daily   Continuous Infusions:   LOS: 10 days    RDarliss Cheney MD [facility]  If 7PM-7AM, please contact night-coverage [email or web address] 06/05/2022, 11:19 AM

## 2022-06-05 NOTE — Progress Notes (Signed)
Mobility Specialist Progress Note:   06/05/22 1359  Mobility  Activity Ambulated with assistance in hallway  Level of Assistance Standby assist, set-up cues, supervision of patient - no hands on  Assistive Device  (hall rails)  Distance Ambulated (ft) 180 ft  Activity Response Tolerated well  $Mobility charge 1 Mobility   Pt received in bed willing to participate in mobility. No complaints of pain. Required a couple short standing breaks. Left in bed with call bell in reach and all needs met.   [facility] Lynora Dymond Mobility Specialist

## 2022-06-06 DIAGNOSIS — D649 Anemia, unspecified: Secondary | ICD-10-CM | POA: Diagnosis not present

## 2022-06-06 LAB — SURGICAL PATHOLOGY

## 2022-06-06 NOTE — Progress Notes (Signed)
PROGRESS NOTE    [name]  NWG:[id number] DOB: 12/10/1950 DOA: 05/26/2022 PCP: Silverio Decamp, MD   Brief Narrative:  71 year old with history of hypertension, hyperlipidemia, chronic A-fib on Eliquis status post ablation, hypothyroidism GERD and anxiety presented to the ER with low hemoglobin.  Patient recently suffering from multiple abdominal symptoms ongoing nausea vomiting poor appetite and diarrhea since 7/9.   Previous hospitalization 7/9-7/17, admitted to [facility] with nausea vomiting and diarrhea, CT scan consistent with right-sided colitis and pneumatosis coli in the proximal hepatic flexure.  Initially treated with IV antibiotics, changed to Augmentin and discharged home where she completed her antibiotic course.     7/24 - (current admission) -noted to have bright red blood per rectum, instructed to discontinue Eliquis in the outpatient setting but with worsening symptoms of fatigue and weakness presented to the ED for symptoms noted to be profoundly anemic requiring transfusion, GI consulted. -Underwent upper GI endoscopy and colonoscopy on 06/02/2022.    Assessment & Plan:   Acute symptomatic blood loss anemia due to GI bleed: -Suspected infectious versus ischemic colitis -Hemoglobin 6.6 at intake status post 2 unit PRBC on 05/26/2022, with no further bleeding;   Repeat CT abd/pelvis shows minimally worsening colitis and due to persistent symptoms she underwent upper GI endoscopy which shows low-grade of narrowing Schatzki ring, small hiatal hernia.  Colonoscopy shows obstructing large mass found in the hepatic flexure.  Zosyn discontinued.  Biopsies were taken.  Result is pending.  General surgery consulted.  CT chest ruled out metastatic disease.  General surgery plans for colectomy but waiting for final path report of the polyps.  Potential surgery is scheduled for Tuesday.  Patient aware. -H&H dropped from 10.1-8.7.  Continue to monitor.  Transfuse  as needed.   Paroxysmal atrial fibrillation: -Currently in sinus rhythm, Eliquis on hold given above GI bleed and now patient is scheduled for potential surgery on Tuesday so we will continue to hold anticoagulation for now.   Essential hypertension:  -Continue metoprolol, irbesartan and Lasix   Hypothyroidism:  -Euthyroid on Synthroid replacement.   Acute hypoxic respiratory failure/atelectasis: Was requiring 2 L of oxygen yesterday, feels better, she is off of oxy all gen and on room air.  Continue with incentive spirometry.  Chronic insomnia/anxiety/depression: -Continue home meds ramelteon, trazodone, Effexor  GERD: Continue PPI  AKI: Resolved  Hypokalemia: Resolved  Rosacea?:  Redness around cheeks comes and goes.  Likely stress-induced.  No treatment indicated.  Bilateral lower extremity edema: Likely chronic.  She is on Lasix 10 mg p.o. twice daily at home which I increased to 20 mg p.o. daily on 06/05/2022.  DVT prophylaxis: SCD Code Status: Full code Family Communication:  None present at bedside.  Plan of care discussed with patient in length and she verbalized understanding and agreed with it. Disposition Plan: Might need SNF postsurgery.  Consultants:  GI  Procedures:  EGD Colonoscopy  Antimicrobials:  Zosyn  Status is: Inpatient     Subjective:  Patient seen and examined.  No new complaint.  Abdominal pain is improving.  Objective: Vitals:   06/05/22 1952 06/06/22 0041 06/06/22 0548 06/06/22 0815  BP: (!) 155/84 (!) 161/74 (!) 181/67 (!) 166/86  Pulse: 66 77 83 81  Resp: '20 20 18 18  '$ Temp: (!) 97.5 F (36.4 C) 98.2 F (36.8 C) 97.8 F (36.6 C) 97.8 F (36.6 C)  TempSrc: Oral Oral Oral Oral  SpO2: 99% 97% 94% 94%  Weight:      Height:  Intake/Output Summary (Last 24 hours) at 06/06/2022 1051 Last data filed at 06/06/2022 0700 Gross per 24 hour  Intake 0 ml  Output 400 ml  Net -400 ml    Filed Weights   05/26/22 1043  Weight:  111.2 kg    Examination:  General exam: Appears calm and comfortable, obese Respiratory system: Clear to auscultation. Respiratory effort normal. Cardiovascular system: S1 & S2 heard, RRR. No JVD, murmurs, rubs, gallops or clicks.  +1-2 pitting edema bilateral lower extremity. Gastrointestinal system: Abdomen is nondistended, soft and nontender. No organomegaly or masses felt. Normal bowel sounds heard. Central nervous system: Alert and oriented. No focal neurological deficits. Extremities: Symmetric 5 x 5 power. Skin: No rashes, lesions or ulcers.  Psychiatry: Judgement and insight appear normal. Mood & affect appropriate.    Data Reviewed: I have personally reviewed following labs and imaging studies  CBC: Recent Labs  Lab 05/31/22 0605 06/01/22 0148 06/02/22 0259 06/03/22 0330 06/04/22 0200  WBC  --   --  3.6* 4.2 4.1  HGB 9.1* 8.3* 10.1* 8.7* 8.8*  HCT 29.8* 27.7* 32.6* 28.9* 28.7*  MCV  --   --  98.5 101.4* 100.0  PLT  --   --  131* 217 527    Basic Metabolic Panel: Recent Labs  Lab 05/31/22 0605 06/01/22 0148 06/02/22 0259 06/03/22 0330 06/04/22 0200  NA 140 141 141 140 140  K 4.0 4.4 3.9 3.6 3.6  CL 106 107 104 106 103  CO2 '26 27 28 26 31  '$ GLUCOSE 101* 98 88 118* 91  BUN 7* 7* 8 6* 6*  CREATININE 0.87 0.96 0.89 0.82 0.82  CALCIUM 8.5* 8.5* 8.7* 8.7* 8.9    GFR: Estimated Creatinine Clearance: 75.4 mL/min (by C-G formula based on SCr of 0.82 mg/dL). Liver Function Tests: No results for input(s): "AST", "ALT", "ALKPHOS", "BILITOT", "PROT", "ALBUMIN" in the last 168 hours.  No results for input(s): "LIPASE", "AMYLASE" in the last 168 hours.  No results for input(s): "AMMONIA" in the last 168 hours. Coagulation Profile: No results for input(s): "INR", "PROTIME" in the last 168 hours. Cardiac Enzymes: No results for input(s): "CKTOTAL", "CKMB", "CKMBINDEX", "TROPONINI" in the last 168 hours. BNP (last 3 results) No results for input(s): "PROBNP" in the  last 8760 hours. HbA1C: No results for input(s): "HGBA1C" in the last 72 hours. CBG: No results for input(s): "GLUCAP" in the last 168 hours. Lipid Profile: No results for input(s): "CHOL", "HDL", "LDLCALC", "TRIG", "CHOLHDL", "LDLDIRECT" in the last 72 hours. Thyroid Function Tests: No results for input(s): "TSH", "T4TOTAL", "FREET4", "T3FREE", "THYROIDAB" in the last 72 hours. Anemia Panel: No results for input(s): "VITAMINB12", "FOLATE", "FERRITIN", "TIBC", "IRON", "RETICCTPCT" in the last 72 hours. Sepsis Labs: No results for input(s): "PROCALCITON", "LATICACIDVEN" in the last 168 hours.   No results found for this or any previous visit (from the past 240 hour(s)).    Radiology Studies: ECHOCARDIOGRAM COMPLETE  Result Date: 06/05/2022    ECHOCARDIOGRAM REPORT   Patient Name:   CHEVELLA PEARCE Date of Exam: 06/04/2022 Medical Rec #:  [id number]    Height:       63.0 in Accession #:    [id number]   Weight:       245.1 lb Date of Birth:  Oct 04, 1951   BSA:          2.108 m Patient Age:    55 years     BP:           125/61 mmHg Patient Gender:  F            HR:           57 bpm. Exam Location:  Inpatient Procedure: 2D Echo, Cardiac Doppler, Color Doppler and Intracardiac            Opacification Agent Indications:    Dyspnea  History:        Patient has no prior history of Echocardiogram examinations.                 Arrythmias:Atrial Fibrillation; Risk Factors:Hypertension and                 Dyslipidemia. GERD. AKI.  Sonographer:    Clayton Lefort RDCS (AE) Referring Phys: Michell Heinrich [facility]  Sonographer Comments: Patient is morbidly obese. Image acquisition challenging due to patient body habitus. IMPRESSIONS  1. Left ventricular ejection fraction, by estimation, is 60 to 65%. The left ventricle has normal function. The left ventricle has no regional wall motion abnormalities. There is mild left ventricular hypertrophy. Left ventricular diastolic parameters were normal. There is the interventricular septum  is flattened in diastole ('D' shaped left ventricle), consistent with right ventricular volume overload.  2. Right ventricular systolic function is normal. The right ventricular size is moderately enlarged. There is mildly elevated pulmonary artery systolic pressure. The estimated right ventricular systolic pressure is 98.3 mmHg.  3. The mitral valve is normal in structure. Mild mitral valve regurgitation. No evidence of mitral stenosis.  4. Tricuspid valve regurgitation is moderate.  5. The aortic valve is grossly normal. There is mild calcification of the aortic valve. Aortic valve regurgitation is not visualized. No aortic stenosis is present.  6. The inferior vena cava is dilated in size with <50% respiratory variability, suggesting right atrial pressure of 15 mmHg. FINDINGS  Left Ventricle: Left ventricular ejection fraction, by estimation, is 60 to 65%. The left ventricle has normal function. The left ventricle has no regional wall motion abnormalities. Definity contrast agent was given IV to delineate the left ventricular  endocardial borders. The left ventricular internal cavity size was normal in size. There is mild left ventricular hypertrophy. The interventricular septum is flattened in diastole ('D' shaped left ventricle), consistent with right ventricular volume overload. Left ventricular diastolic parameters were normal. Right Ventricle: The right ventricular size is moderately enlarged. No increase in right ventricular wall thickness. Right ventricular systolic function is normal. There is mildly elevated pulmonary artery systolic pressure. The tricuspid regurgitant velocity is 2.72 m/s, and with an assumed right atrial pressure of 15 mmHg, the estimated right ventricular systolic pressure is 38.2 mmHg. Left Atrium: Left atrial size was normal in size. Right Atrium: Right atrial size was normal in size. Pericardium: Trivial pericardial effusion is present. Mitral Valve: The mitral valve is normal in  structure. Mild mitral valve regurgitation. No evidence of mitral valve stenosis. Tricuspid Valve: The tricuspid valve is normal in structure. Tricuspid valve regurgitation is moderate . No evidence of tricuspid stenosis. Aortic Valve: The aortic valve is grossly normal. There is mild calcification of the aortic valve. Aortic valve regurgitation is not visualized. No aortic stenosis is present. Aortic valve mean gradient measures 4.0 mmHg. Aortic valve peak gradient measures 8.8 mmHg. Aortic valve area, by VTI measures 2.10 cm. Pulmonic Valve: The pulmonic valve was normal in structure. Pulmonic valve regurgitation is trivial. No evidence of pulmonic stenosis. Aorta: The aortic root is normal in size and structure. Venous: The inferior vena cava is dilated in size with less than 50%  respiratory variability, suggesting right atrial pressure of 15 mmHg. IAS/Shunts: The interatrial septum was not well visualized.  LEFT VENTRICLE PLAX 2D LVIDd:         4.10 cm   Diastology LVIDs:         2.50 cm   LV e' medial:    8.16 cm/s LV PW:         1.20 cm   LV E/e' medial:  14.7 LV IVS:        1.10 cm   LV e' lateral:   8.27 cm/s LVOT diam:     2.00 cm   LV E/e' lateral: 14.5 LV SV:         69 LV SV Index:   33 LVOT Area:     3.14 cm  RIGHT VENTRICLE             IVC RV Basal diam:  3.40 cm     IVC diam: 2.20 cm RV S prime:     11.60 cm/s TAPSE (M-mode): 2.4 cm LEFT ATRIUM             Index        RIGHT ATRIUM           Index LA diam:        3.70 cm 1.75 cm/m   RA Area:     17.80 cm LA Vol (A2C):   48.7 ml 23.10 ml/m  RA Volume:   50.90 ml  24.14 ml/m LA Vol (A4C):   72.7 ml 34.48 ml/m LA Biplane Vol: 66.0 ml 31.30 ml/m  AORTIC VALVE AV Area (Vmax):    2.08 cm AV Area (Vmean):   2.05 cm AV Area (VTI):     2.10 cm AV Vmax:           148.00 cm/s AV Vmean:          94.400 cm/s AV VTI:            0.327 m AV Peak Grad:      8.8 mmHg AV Mean Grad:      4.0 mmHg LVOT Vmax:         98.00 cm/s LVOT Vmean:        61.600 cm/s  LVOT VTI:          0.219 m LVOT/AV VTI ratio: 0.67  AORTA Ao Root diam: 3.20 cm Ao Asc diam:  3.50 cm MITRAL VALVE                TRICUSPID VALVE MV Area (PHT): 2.62 cm     TR Peak grad:   29.6 mmHg MV Decel Time: 290 msec     TR Vmax:        272.00 cm/s MV E velocity: 120.00 cm/s MV A velocity: 40.00 cm/s   SHUNTS MV E/A ratio:  3.00         Systemic VTI:  0.22 m                             Systemic Diam: 2.00 cm Cherlynn Kaiser MD Electronically signed by Cherlynn Kaiser MD Signature Date/Time: 06/05/2022/6:42:10 AM    Final     Scheduled Meds:  flecainide  50 mg Oral Q12H   furosemide  20 mg Oral BID   gabapentin  1,200 mg Oral QHS   icosapent Ethyl  2 g Oral BID   irbesartan  300 mg Oral Daily   levothyroxine  75  mcg Oral Q breakfast   metoprolol succinate  50 mg Oral Daily   pantoprazole  40 mg Oral Daily   ramelteon  8 mg Oral QHS   sodium chloride flush  3 mL Intravenous Q12H   traZODone  100 mg Oral QHS   venlafaxine XR  225 mg Oral Daily   Continuous Infusions:   LOS: 11 days    Darliss Cheney, MD [facility]  If 7PM-7AM, please contact night-coverage [email or web address] 06/06/2022, 10:51 AM

## 2022-06-06 NOTE — Plan of Care (Signed)
  Problem: Clinical Measurements: Goal: Respiratory complications will improve Outcome: Progressing Goal: Cardiovascular complication will be avoided Outcome: Progressing   

## 2022-06-06 NOTE — Progress Notes (Signed)
Path resulted as below.   FINAL MICROSCOPIC DIAGNOSIS:   A. COLON, TRANSVERSE AND DESCENDING, POLYPECTOMY:  Tubular adenoma.  Negative for high-grade dysplasia.   B. 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  A small fragment of normal colonic mucosa is present.  Please see comment.   Comment: The patient's colonoscopy report is reviewed.  The specimen B  labeled as ulcerated mass at 80 cm.  does not contain any portions of a  neoplasm.  In view of the obstructing mass described in the colonoscopy,  re-biopsy should be considered to rule out a neoplastic process.   Reviewed with the patient and her family. Recommend proceeding with laparoscopic assisted right colectomy, possible open right colectomy, tentatively tomorrow. The planned procedure and material risks were discussed with the patient. Risks include but are not limited to anesthesia (MI, CVA, prolonged intubation, aspiration, death), pain, bleeding, infection, scarring, hernia, damage to surrounding structures (blood vessels/nerves/viscus/organs/ureter), DVT/PE, ileus, leak from anastomosis and possible need for stoma/ileostomy. We also discussed typical post-operative care including the possible need for rehab/snf if necessary. The patient's questions were answered to their satisfaction, they voiced understanding and elected to proceed with surgery.   Jillyn Ledger, PA-C

## 2022-06-06 NOTE — Progress Notes (Signed)
[facility] Progress Note  4 Days Post-Op  Subjective: CC: anxiety  Mild R sided abd pain. States she is passing gas and having non-bloody BMs. Tolerating CLD.   Objective: Vital signs in last 24 hours: Temp:  [97.4 F (36.3 C)-98.2 F (36.8 C)] 97.8 F (36.6 C) (08/07 0815) Pulse Rate:  [66-83] 81 (08/07 0815) Resp:  [18-20] 18 (08/07 0815) BP: (146-181)/(67-96) 166/86 (08/07 0815) SpO2:  [94 %-99 %] 94 % (08/07 0815) Last BM Date : 06/06/22  Intake/Output from previous day: 08/06 0701 - 08/07 0700 In: 0  Out: 400 [Urine:400] Intake/Output this shift: No intake/output data recorded.  PE: Gen:  Alert, NAD, appears uncomfortable Card:  Regular rate and rhythm Pulm:  slightly labored ORA Abd: Soft, mild distention,  tenderness to palpation R hemi-abdomen/RLQ without guarding or rebound tenderness. No hernias. Skin: warm and dry, no rashes  Psych: A&Ox3   Lab Results:  Recent Labs    06/04/22 0200  WBC 4.1  HGB 8.8*  HCT 28.7*  PLT 216   BMET Recent Labs    06/04/22 0200  NA 140  K 3.6  CL 103  CO2 31  GLUCOSE 91  BUN 6*  CREATININE 0.82  CALCIUM 8.9   PT/INR No results for input(s): "LABPROT", "INR" in the last 72 hours. CMP     Component Value Date/Time   NA 140 06/04/2022 0200   K 3.6 06/04/2022 0200   CL 103 06/04/2022 0200   CO2 31 06/04/2022 0200   GLUCOSE 91 06/04/2022 0200   BUN 6 (L) 06/04/2022 0200   CREATININE 0.82 06/04/2022 0200   CREATININE 0.85 05/25/2022 0000   CALCIUM 8.9 06/04/2022 0200   PROT 5.5 (L) 05/29/2022 1634   ALBUMIN 2.8 (L) 05/29/2022 1634   AST 40 05/29/2022 1634   ALT 27 05/29/2022 1634   ALKPHOS 78 05/29/2022 1634   BILITOT 0.9 05/29/2022 1634   GFRNONAA >60 06/04/2022 0200   GFRNONAA 64 03/11/2020 1202   GFRAA 74 03/11/2020 1202   Lipase     Component Value Date/Time   LIPASE 25 05/29/2022 1634       Studies/Results: ECHOCARDIOGRAM COMPLETE  Result Date: 06/05/2022    ECHOCARDIOGRAM  REPORT   Patient Name:   [name] Date of Exam: 06/04/2022 Medical Rec #:  [id number]    Height:       63.0 in Accession #:    [id number]   Weight:       245.1 lb Date of Birth:  1951/10/31   BSA:          2.108 m Patient Age:    71 years     BP:           125/61 mmHg Patient Gender: F            HR:           57 bpm. Exam Location:  Inpatient Procedure: 2D Echo, Cardiac Doppler, Color Doppler and Intracardiac            Opacification Agent Indications:    Dyspnea  History:        Patient has no prior history of Echocardiogram examinations.                 Arrythmias:Atrial Fibrillation; Risk Factors:Hypertension and                 Dyslipidemia. GERD. AKI.  Sonographer:    Clayton Lefort RDCS (AE) Referring Phys: Michell Heinrich [facility]  Sonographer Comments: Patient is morbidly obese. Image acquisition challenging due to patient body habitus. IMPRESSIONS  1. Left ventricular ejection fraction, by estimation, is 60 to 65%. The left ventricle has normal function. The left ventricle has no regional wall motion abnormalities. There is mild left ventricular hypertrophy. Left ventricular diastolic parameters were normal. There is the interventricular septum is flattened in diastole ('D' shaped left ventricle), consistent with right ventricular volume overload.  2. Right ventricular systolic function is normal. The right ventricular size is moderately enlarged. There is mildly elevated pulmonary artery systolic pressure. The estimated right ventricular systolic pressure is 93.2 mmHg.  3. The mitral valve is normal in structure. Mild mitral valve regurgitation. No evidence of mitral stenosis.  4. Tricuspid valve regurgitation is moderate.  5. The aortic valve is grossly normal. There is mild calcification of the aortic valve. Aortic valve regurgitation is not visualized. No aortic stenosis is present.  6. The inferior vena cava is dilated in size with <50% respiratory variability, suggesting right atrial pressure of 15 mmHg.  FINDINGS  Left Ventricle: Left ventricular ejection fraction, by estimation, is 60 to 65%. The left ventricle has normal function. The left ventricle has no regional wall motion abnormalities. Definity contrast agent was given IV to delineate the left ventricular  endocardial borders. The left ventricular internal cavity size was normal in size. There is mild left ventricular hypertrophy. The interventricular septum is flattened in diastole ('D' shaped left ventricle), consistent with right ventricular volume overload. Left ventricular diastolic parameters were normal. Right Ventricle: The right ventricular size is moderately enlarged. No increase in right ventricular wall thickness. Right ventricular systolic function is normal. There is mildly elevated pulmonary artery systolic pressure. The tricuspid regurgitant velocity is 2.72 m/s, and with an assumed right atrial pressure of 15 mmHg, the estimated right ventricular systolic pressure is 67.1 mmHg. Left Atrium: Left atrial size was normal in size. Right Atrium: Right atrial size was normal in size. Pericardium: Trivial pericardial effusion is present. Mitral Valve: The mitral valve is normal in structure. Mild mitral valve regurgitation. No evidence of mitral valve stenosis. Tricuspid Valve: The tricuspid valve is normal in structure. Tricuspid valve regurgitation is moderate . No evidence of tricuspid stenosis. Aortic Valve: The aortic valve is grossly normal. There is mild calcification of the aortic valve. Aortic valve regurgitation is not visualized. No aortic stenosis is present. Aortic valve mean gradient measures 4.0 mmHg. Aortic valve peak gradient measures 8.8 mmHg. Aortic valve area, by VTI measures 2.10 cm. Pulmonic Valve: The pulmonic valve was normal in structure. Pulmonic valve regurgitation is trivial. No evidence of pulmonic stenosis. Aorta: The aortic root is normal in size and structure. Venous: The inferior vena cava is dilated in size with  less than 50% respiratory variability, suggesting right atrial pressure of 15 mmHg. IAS/Shunts: The interatrial septum was not well visualized.  LEFT VENTRICLE PLAX 2D LVIDd:         4.10 cm   Diastology LVIDs:         2.50 cm   LV e' medial:    8.16 cm/s LV PW:         1.20 cm   LV E/e' medial:  14.7 LV IVS:        1.10 cm   LV e' lateral:   8.27 cm/s LVOT diam:     2.00 cm   LV E/e' lateral: 14.5 LV SV:         69 LV SV Index:   33 LVOT  Area:     3.14 cm  RIGHT VENTRICLE             IVC RV Basal diam:  3.40 cm     IVC diam: 2.20 cm RV S prime:     11.60 cm/s TAPSE (M-mode): 2.4 cm LEFT ATRIUM             Index        RIGHT ATRIUM           Index LA diam:        3.70 cm 1.75 cm/m   RA Area:     17.80 cm LA Vol (A2C):   48.7 ml 23.10 ml/m  RA Volume:   50.90 ml  24.14 ml/m LA Vol (A4C):   72.7 ml 34.48 ml/m LA Biplane Vol: 66.0 ml 31.30 ml/m  AORTIC VALVE AV Area (Vmax):    2.08 cm AV Area (Vmean):   2.05 cm AV Area (VTI):     2.10 cm AV Vmax:           148.00 cm/s AV Vmean:          94.400 cm/s AV VTI:            0.327 m AV Peak Grad:      8.8 mmHg AV Mean Grad:      4.0 mmHg LVOT Vmax:         98.00 cm/s LVOT Vmean:        61.600 cm/s LVOT VTI:          0.219 m LVOT/AV VTI ratio: 0.67  AORTA Ao Root diam: 3.20 cm Ao Asc diam:  3.50 cm MITRAL VALVE                TRICUSPID VALVE MV Area (PHT): 2.62 cm     TR Peak grad:   29.6 mmHg MV Decel Time: 290 msec     TR Vmax:        272.00 cm/s MV E velocity: 120.00 cm/s MV A velocity: 40.00 cm/s   SHUNTS MV E/A ratio:  3.00         Systemic VTI:  0.22 m                             Systemic Diam: 2.00 cm Cherlynn Kaiser MD Electronically signed by Cherlynn Kaiser MD Signature Date/Time: 06/05/2022/6:42:10 AM    Final     Anti-infectives: Anti-infectives (From admission, onward)    Start     Dose/Rate Route Frequency Ordered Stop   05/27/22 1030  piperacillin-tazobactam (ZOSYN) IVPB 3.375 g        3.375 g 12.5 mL/hr over 240 Minutes Intravenous Every 8 hours  05/27/22 0943 06/03/22 1359        Assessment/Plan  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 Mild LVH. - will need surgical resection of mass, await pathology results in order to complete surgical planning.   ABL anemia - hgb 6.6 on admission, s/p 2u pRBC 05/26/22. Hgb 8.8 today, stable.   FEN - Would keep on CLD VTE - SCDs, hgb stable -  ok for chemical DVT ppx  ID - None indicated from our standpoint   HTN HLD A. Fib s/p ablation on Eliquis at baseline - currently held Hypothyroidism    LOS: 11 days   I reviewed nursing notes, Consultant GI notes, hospitalist notes, last 24 h vitals and pain scores, last 48 h intake and output, last 24 h labs and trends, and last 24 h imaging results.  Obie Dredge, PA-C [facility] Please see Amion for pager number during day hours 7:00am-4:30pm

## 2022-06-06 NOTE — Progress Notes (Signed)
Mobility Specialist: Progress Note   06/06/22 1009  Mobility  Activity Ambulated with assistance in hallway  Level of Assistance Standby assist, set-up cues, supervision of patient - no hands on  Assistive Device None  Distance Ambulated (ft) 150 ft  Activity Response Tolerated well  $Mobility charge 1 Mobility   Post-Mobility: 100 HR, 93% SpO2  Pt received in the bed and agreeable to mobility. C/o some anxiety and mild SOB during session. Pt returned to bed after session with call bell and phone at her side.   [facility] [name] Mobility Specialist Mobility Specialist [address]: [phone]

## 2022-06-06 NOTE — Progress Notes (Signed)
Physical Therapy Treatment Patient Details Name: [name] MRN: [id number] DOB: May 10, 1951 Today's Date: 06/06/2022   History of Present Illness 71 yo female with onset of abd pain, N/V/D. and rectal bleeding was admitted on 7/27, MD concerned for diverticulitis vs mass. has pleural effusion and atelectasis, Hgb low with 2 units PRBC's transfused.  Holding her Eliquis.  PMHx:  hypertension, hyperlipidemia, chronic A-fib on Eliquis status post ablation, hypothyroidism GERD and anxiety    PT Comments    Pt continues to make slow but steady progress towards acute goals, however pt continues to be limited by weakness, decreased endurance and DOE. Pt demonstrating ambulation in hall without AD, however pt reaching for rail for steadying support as well as HHA when rail not present. Pt without LOB but noted difficulties maintaining balance throughout ambulation. Encouraged pt to continue OOB mobility as tolerated. Pt continues to benefit from skilled PT services to progress toward functional mobility goals.    Recommendations for follow up therapy are one component of a multi-disciplinary discharge planning process, led by the attending physician.  Recommendations may be updated based on patient status, additional functional criteria and insurance authorization.  Follow Up Recommendations  Home health PT     Assistance Recommended at Discharge Frequent or constant Supervision/Assistance  Patient can return home with the following A little help with walking and/or transfers;A little help with bathing/dressing/bathroom;Assistance with cooking/housework;Direct supervision/assist for medications management;Assist for transportation;Help with stairs or ramp for entrance   Equipment Recommendations  Rolling walker (2 wheels)    Recommendations for Other Services       Precautions / Restrictions Precautions Precautions: Fall Precaution Comments: watch O2 Restrictions Weight Bearing  Restrictions: No     Mobility  Bed Mobility Overal bed mobility: Needs Assistance Bed Mobility: Supine to Sit, Sit to Supine     Supine to sit: Supervision Sit to supine: Supervision   General bed mobility comments: supervision for all aspects    Transfers Overall transfer level: Needs assistance Equipment used: None Transfers: Sit to/from Stand Sit to Stand: Supervision           General transfer comment: no AD    Ambulation/Gait Ambulation/Gait assistance: Min guard, Min assist Gait Distance (Feet): 180 Feet Assistive device: None, 1 person hand held assist Gait Pattern/deviations: Step-through pattern, Decreased stride length, Wide base of support Gait velocity: reduced     General Gait Details: pt is slow but steady without AD, multiple standing recovery breaks needed. pt reaching for rail throughout to steady as pt with increased fatigue this session, HHA for short distance where rail not Occupational hygienist    Modified Rankin (Stroke Patients Only)       Balance Overall balance assessment: Needs assistance Sitting-balance support: Feet supported Sitting balance-Leahy Scale: Fair     Standing balance support: Single extremity supported, During functional activity Standing balance-Leahy Scale: Fair                              Cognition Arousal/Alertness: Awake/alert Behavior During Therapy: WFL for tasks assessed/performed Overall Cognitive Status: Within Functional Limits for tasks assessed                                          Exercises Other Exercises  Other Exercises: encouraged pt to continue ambulation and OOB for increased distance than just to BR and back to bed    General Comments        Pertinent Vitals/Pain      Home Living                          Prior Function            PT Goals (current goals can now be found in the care plan section)  Acute Rehab PT Goals Patient Stated Goal: to get home and feel better PT Goal Formulation: With patient Time For Goal Achievement: 06/13/22    Frequency    Min 3X/week      PT Plan      Co-evaluation              AM-PAC PT "6 Clicks" Mobility   Outcome Measure  Help needed turning from your back to your side while in a flat bed without using bedrails?: A Little Help needed moving from lying on your back to sitting on the side of a flat bed without using bedrails?: A Little Help needed moving to and from a bed to a chair (including a wheelchair)?: A Little Help needed standing up from a chair using your arms (e.g., wheelchair or bedside chair)?: A Little Help needed to walk in hospital room?: A Little Help needed climbing 3-5 steps with a railing? : A Lot 6 Click Score: 17    End of Session   Activity Tolerance: Patient tolerated treatment well;Patient limited by fatigue Patient left: in bed;with call bell/phone within reach Nurse Communication: Mobility status PT Visit Diagnosis: Unsteadiness on feet (R26.81);Muscle weakness (generalized) (M62.81);Dizziness and giddiness (R42)     Time: 0630-1601 PT Time Calculation (min) (ACUTE ONLY): 18 min  Charges:  $Gait Training: 8-22 mins                     Emileigh Kellett R. PTA Acute Rehabilitation Services Office: [place] 06/06/2022, 2:52 PM

## 2022-06-07 ENCOUNTER — Inpatient Hospital Stay (HOSPITAL_COMMUNITY): Payer: Medicare Other | Admitting: Anesthesiology

## 2022-06-07 ENCOUNTER — Encounter (HOSPITAL_COMMUNITY)
Admission: EM | Disposition: A | Discharge status: Home Health | Payer: Self-pay | Source: Home / Self Care | Attending: Family Medicine

## 2022-06-07 ENCOUNTER — Encounter (HOSPITAL_COMMUNITY): Payer: Self-pay | Admitting: Internal Medicine

## 2022-06-07 DIAGNOSIS — D638 Anemia in other chronic diseases classified elsewhere: Secondary | ICD-10-CM

## 2022-06-07 DIAGNOSIS — K6389 Other specified diseases of intestine: Secondary | ICD-10-CM | POA: Diagnosis not present

## 2022-06-07 DIAGNOSIS — I10 Essential (primary) hypertension: Secondary | ICD-10-CM | POA: Diagnosis not present

## 2022-06-07 DIAGNOSIS — E039 Hypothyroidism, unspecified: Secondary | ICD-10-CM

## 2022-06-07 DIAGNOSIS — D649 Anemia, unspecified: Secondary | ICD-10-CM | POA: Diagnosis not present

## 2022-06-07 HISTORY — PX: LAPAROSCOPIC PARTIAL COLECTOMY: SHX5907

## 2022-06-07 LAB — SURGICAL PCR SCREEN
MRSA, PCR: NEGATIVE
Staphylococcus aureus: NEGATIVE

## 2022-06-07 SURGERY — LAPAROSCOPIC PARTIAL COLECTOMY
Anesthesia: General

## 2022-06-07 MED ORDER — FENTANYL CITRATE (PF) 250 MCG/5ML IJ SOLN
INTRAMUSCULAR | Status: AC
  Filled 2022-06-07: qty 5

## 2022-06-07 MED ORDER — BOOST / RESOURCE BREEZE PO LIQD CUSTOM
1.0000 | Freq: Three times a day (TID) | ORAL | Status: DC
  Administered 2022-06-07 – 2022-06-13 (×12): 1 via ORAL

## 2022-06-07 MED ORDER — LACTATED RINGERS IV SOLN: INTRAVENOUS | Status: DC

## 2022-06-07 MED ORDER — MIDAZOLAM HCL 2 MG/2ML IJ SOLN
INTRAMUSCULAR | Status: DC | PRN
  Administered 2022-06-07: 2 mg via INTRAVENOUS

## 2022-06-07 MED ORDER — PROMETHAZINE HCL 25 MG/ML IJ SOLN: 6.2500 mg | INTRAMUSCULAR | Status: DC | PRN

## 2022-06-07 MED ORDER — LACTATED RINGERS IV SOLN: INTRAVENOUS | Status: DC | PRN

## 2022-06-07 MED ORDER — BUPIVACAINE HCL 0.25 % IJ SOLN
INTRAMUSCULAR | Status: DC | PRN
  Administered 2022-06-07: 7 mL

## 2022-06-07 MED ORDER — OXYCODONE HCL 5 MG/5ML PO SOLN: 5.0000 mg | Freq: Once | ORAL | Status: DC | PRN

## 2022-06-07 MED ORDER — SUGAMMADEX SODIUM 200 MG/2ML IV SOLN
INTRAVENOUS | Status: DC | PRN
  Administered 2022-06-07: 400 mg via INTRAVENOUS

## 2022-06-07 MED ORDER — OXYCODONE HCL 5 MG PO TABS: 5.0000 mg | ORAL_TABLET | Freq: Once | ORAL | Status: DC | PRN

## 2022-06-07 MED ORDER — SODIUM CHLORIDE 0.9 % IR SOLN
Status: DC | PRN
  Administered 2022-06-07: 3000 mL

## 2022-06-07 MED ORDER — FENTANYL CITRATE (PF) 250 MCG/5ML IJ SOLN
INTRAMUSCULAR | Status: DC | PRN
  Administered 2022-06-07: 50 ug via INTRAVENOUS
  Administered 2022-06-07: 100 ug via INTRAVENOUS

## 2022-06-07 MED ORDER — PHENYLEPHRINE HCL-NACL 20-0.9 MG/250ML-% IV SOLN
INTRAVENOUS | Status: DC | PRN
  Administered 2022-06-07: 25 ug/min via INTRAVENOUS

## 2022-06-07 MED ORDER — ROCURONIUM BROMIDE 10 MG/ML (PF) SYRINGE
PREFILLED_SYRINGE | INTRAVENOUS | Status: DC | PRN
  Administered 2022-06-07: 100 mg via INTRAVENOUS

## 2022-06-07 MED ORDER — CEFOTETAN DISODIUM 1 G IJ SOLR
1.0000 g | Freq: Two times a day (BID) | INTRAMUSCULAR | Status: AC
Start: 2022-06-07 — End: 2022-06-08
  Administered 2022-06-07 – 2022-06-08 (×2): 1 g via INTRAVENOUS
  Filled 2022-06-07 (×2): qty 1

## 2022-06-07 MED ORDER — OXYCODONE HCL 5 MG PO TABS
5.0000 mg | ORAL_TABLET | ORAL | Status: DC | PRN
  Administered 2022-06-07 – 2022-06-08 (×4): 10 mg via ORAL
  Filled 2022-06-07 (×4): qty 2

## 2022-06-07 MED ORDER — PROPOFOL 10 MG/ML IV BOLUS
INTRAVENOUS | Status: DC | PRN
  Administered 2022-06-07: 150 mg via INTRAVENOUS

## 2022-06-07 MED ORDER — DEXAMETHASONE SODIUM PHOSPHATE 10 MG/ML IJ SOLN
INTRAMUSCULAR | Status: DC | PRN
  Administered 2022-06-07: 10 mg via INTRAVENOUS

## 2022-06-07 MED ORDER — ONDANSETRON HCL 4 MG/2ML IJ SOLN
INTRAMUSCULAR | Status: DC | PRN
  Administered 2022-06-07: 4 mg via INTRAVENOUS

## 2022-06-07 MED ORDER — LIDOCAINE 2% (20 MG/ML) 5 ML SYRINGE
INTRAMUSCULAR | Status: DC | PRN
  Administered 2022-06-07: 100 mg via INTRAVENOUS

## 2022-06-07 MED ORDER — SODIUM CHLORIDE 0.9 % IV SOLN: INTRAVENOUS | Status: DC | PRN

## 2022-06-07 MED ORDER — PROPOFOL 10 MG/ML IV BOLUS
INTRAVENOUS | Status: AC
  Filled 2022-06-07: qty 20

## 2022-06-07 MED ORDER — BUPIVACAINE-EPINEPHRINE (PF) 0.25% -1:200000 IJ SOLN
INTRAMUSCULAR | Status: AC
  Filled 2022-06-07: qty 30

## 2022-06-07 MED ORDER — ACETAMINOPHEN 500 MG PO TABS: 1000.0000 mg | ORAL_TABLET | Freq: Three times a day (TID) | ORAL | Status: DC | PRN

## 2022-06-07 MED ORDER — ONDANSETRON HCL 4 MG/2ML IJ SOLN
INTRAMUSCULAR | Status: AC
  Filled 2022-06-07: qty 2

## 2022-06-07 MED ORDER — MIDAZOLAM HCL 2 MG/2ML IJ SOLN
INTRAMUSCULAR | Status: AC
  Filled 2022-06-07: qty 2

## 2022-06-07 MED ORDER — ENOXAPARIN SODIUM 40 MG/0.4ML IJ SOSY
40.0000 mg | PREFILLED_SYRINGE | INTRAMUSCULAR | Status: DC
  Administered 2022-06-08 – 2022-06-09 (×2): 40 mg via SUBCUTANEOUS
  Filled 2022-06-07 (×2): qty 0.4

## 2022-06-07 MED ORDER — HYDROMORPHONE HCL 1 MG/ML IJ SOLN
INTRAMUSCULAR | Status: AC
  Filled 2022-06-07: qty 1

## 2022-06-07 MED ORDER — EPHEDRINE SULFATE-NACL 50-0.9 MG/10ML-% IV SOSY
PREFILLED_SYRINGE | INTRAVENOUS | Status: DC | PRN
  Administered 2022-06-07 (×2): 5 mg via INTRAVENOUS

## 2022-06-07 MED ORDER — AMISULPRIDE (ANTIEMETIC) 5 MG/2ML IV SOLN: 10.0000 mg | Freq: Once | INTRAVENOUS | Status: DC | PRN

## 2022-06-07 MED ORDER — CHLORHEXIDINE GLUCONATE 0.12 % MT SOLN
15.0000 mL | OROMUCOSAL | Status: AC
  Administered 2022-06-07: 15 mL via OROMUCOSAL

## 2022-06-07 MED ORDER — SODIUM CHLORIDE 0.9 % IV SOLN
2.0000 g | INTRAVENOUS | Status: AC
  Administered 2022-06-07: 2 g via INTRAVENOUS
  Filled 2022-06-07 (×2): qty 2

## 2022-06-07 MED ORDER — HYDROMORPHONE HCL 1 MG/ML IJ SOLN
0.2500 mg | INTRAMUSCULAR | Status: DC | PRN
  Administered 2022-06-07 (×2): 0.25 mg via INTRAVENOUS

## 2022-06-07 MED ORDER — LIDOCAINE 2% (20 MG/ML) 5 ML SYRINGE
INTRAMUSCULAR | Status: AC
  Filled 2022-06-07: qty 5

## 2022-06-07 MED ORDER — DEXAMETHASONE SODIUM PHOSPHATE 10 MG/ML IJ SOLN
INTRAMUSCULAR | Status: AC
  Filled 2022-06-07: qty 1

## 2022-06-07 MED ORDER — MORPHINE SULFATE (PF) 2 MG/ML IV SOLN
2.0000 mg | INTRAVENOUS | Status: DC | PRN
  Administered 2022-06-07 – 2022-06-08 (×3): 2 mg via INTRAVENOUS
  Filled 2022-06-07 (×3): qty 1

## 2022-06-07 SURGICAL SUPPLY — 82 items
APPLIER CLIP ROT 10 11.4 M/L (STAPLE)
BAG COUNTER SPONGE SURGICOUNT (BAG) ×2 IMPLANT
BLADE CLIPPER SURG (BLADE) IMPLANT
CANISTER SUCT 3000ML PPV (MISCELLANEOUS) ×2 IMPLANT
CELLS DAT CNTRL 66122 CELL SVR (MISCELLANEOUS) ×1 IMPLANT
CHLORAPREP W/TINT 26 (MISCELLANEOUS) ×2 IMPLANT
CLIP APPLIE ROT 10 11.4 M/L (STAPLE) IMPLANT
COVER MAYO STAND STRL (DRAPES) ×2 IMPLANT
COVER SURGICAL LIGHT HANDLE (MISCELLANEOUS) ×4 IMPLANT
DEFOGGER SCOPE WARMER CLEARIFY (MISCELLANEOUS) ×2 IMPLANT
DRAPE HALF SHEET 40X57 (DRAPES) IMPLANT
DRAPE UTILITY XL STRL (DRAPES) ×2 IMPLANT
DRAPE WARM FLUID 44X44 (DRAPES) ×2 IMPLANT
DRSG OPSITE POSTOP 4X10 (GAUZE/BANDAGES/DRESSINGS) IMPLANT
DRSG OPSITE POSTOP 4X8 (GAUZE/BANDAGES/DRESSINGS) ×1 IMPLANT
ELECT BLADE 6.5 EXT (BLADE) ×2 IMPLANT
ELECT CAUTERY BLADE 6.4 (BLADE) ×2 IMPLANT
ELECT REM PT RETURN 9FT ADLT (ELECTROSURGICAL) ×2
ELECTRODE REM PT RTRN 9FT ADLT (ELECTROSURGICAL) ×1 IMPLANT
GEL ULTRASOUND 20GR AQUASONIC (MISCELLANEOUS) IMPLANT
GLOVE BIO SURGEON STRL SZ7.5 (GLOVE) ×4 IMPLANT
GLOVE BIOGEL PI IND STRL 8 (GLOVE) ×2 IMPLANT
GLOVE BIOGEL PI INDICATOR 8 (GLOVE) ×4
GLOVE SURG SYN 7.5  E (GLOVE) ×4
GLOVE SURG SYN 7.5 E (GLOVE) ×2 IMPLANT
GLOVE SURG SYN 7.5 PF PI (GLOVE) ×2 IMPLANT
GOWN STRL REUS W/ TWL LRG LVL3 (GOWN DISPOSABLE) ×6 IMPLANT
GOWN STRL REUS W/ TWL XL LVL3 (GOWN DISPOSABLE) ×2 IMPLANT
GOWN STRL REUS W/TWL LRG LVL3 (GOWN DISPOSABLE) ×12
GOWN STRL REUS W/TWL XL LVL3 (GOWN DISPOSABLE) ×4
IRRIG SUCT STRYKERFLOW 2 WTIP (MISCELLANEOUS) ×2
IRRIGATION SUCT STRKRFLW 2 WTP (MISCELLANEOUS) IMPLANT
KIT BASIN OR (CUSTOM PROCEDURE TRAY) ×2 IMPLANT
KIT SIGMOIDOSCOPE (SET/KITS/TRAYS/PACK) IMPLANT
KIT TURNOVER KIT B (KITS) ×2 IMPLANT
LEGGING LITHOTOMY PAIR STRL (DRAPES) IMPLANT
LIGASURE IMPACT 36 18CM CVD LR (INSTRUMENTS) IMPLANT
LIGASURE VESSEL 5MM BLUNT TIP (ELECTROSURGICAL) ×1 IMPLANT
NDL INSUFFLATION 14GA 120MM (NEEDLE) IMPLANT
NEEDLE INSUFFLATION 14GA 120MM (NEEDLE) ×2 IMPLANT
NS IRRIG 1000ML POUR BTL (IV SOLUTION) ×4 IMPLANT
PAD ARMBOARD 7.5X6 YLW CONV (MISCELLANEOUS) ×4 IMPLANT
PENCIL SMOKE EVACUATOR (MISCELLANEOUS) ×4 IMPLANT
POUCH LAPAROSCOPIC INSTRUMENT (MISCELLANEOUS) ×2 IMPLANT
RELOAD PROXIMATE 75MM BLUE (ENDOMECHANICALS) ×6 IMPLANT
RELOAD STAPLE 75 3.8 BLU REG (ENDOMECHANICALS) IMPLANT
RETRACTOR WND ALEXIS 18 MED (MISCELLANEOUS) IMPLANT
RTRCTR WOUND ALEXIS 18CM MED (MISCELLANEOUS) ×2
SCISSORS LAP 5X35 DISP (ENDOMECHANICALS) ×2 IMPLANT
SET IRRIG TUBING LAPAROSCOPIC (IRRIGATION / IRRIGATOR) IMPLANT
SET TUBE SMOKE EVAC HIGH FLOW (TUBING) ×2 IMPLANT
SHEARS HARMONIC ACE PLUS 36CM (ENDOMECHANICALS) ×2 IMPLANT
SLEEVE ENDOPATH XCEL 5M (ENDOMECHANICALS) ×2 IMPLANT
SLEEVE Z-THREAD 5X100MM (TROCAR) ×2 IMPLANT
SPECIMEN JAR LARGE (MISCELLANEOUS) ×2 IMPLANT
STAPLER PROXIMATE 75MM BLUE (STAPLE) ×1 IMPLANT
STAPLER VISISTAT 35W (STAPLE) ×2 IMPLANT
SURGILUBE 2OZ TUBE FLIPTOP (MISCELLANEOUS) IMPLANT
SUT PDS AB 1 CT  36 (SUTURE) ×6
SUT PDS AB 1 CT 36 (SUTURE) ×2 IMPLANT
SUT PROLENE 2 0 CT2 30 (SUTURE) IMPLANT
SUT PROLENE 2 0 KS (SUTURE) IMPLANT
SUT SILK 2 0 (SUTURE) ×2
SUT SILK 2 0 SH CR/8 (SUTURE) ×2 IMPLANT
SUT SILK 2-0 18XBRD TIE 12 (SUTURE) ×1 IMPLANT
SUT SILK 3 0 (SUTURE) ×2
SUT SILK 3 0 SH CR/8 (SUTURE) ×2 IMPLANT
SUT SILK 3-0 18XBRD TIE 12 (SUTURE) ×1 IMPLANT
SYR BULB IRRIG 60ML STRL (SYRINGE) ×2 IMPLANT
SYS LAPSCP GELPORT 120MM (MISCELLANEOUS)
SYSTEM LAPSCP GELPORT 120MM (MISCELLANEOUS) IMPLANT
TOWEL GREEN STERILE (TOWEL DISPOSABLE) ×4 IMPLANT
TRAY FOLEY MTR SLVR 16FR STAT (SET/KITS/TRAYS/PACK) ×2 IMPLANT
TRAY LAPAROSCOPIC MC (CUSTOM PROCEDURE TRAY) ×2 IMPLANT
TROCAR XCEL 12X100 BLDLESS (ENDOMECHANICALS) IMPLANT
TROCAR XCEL BLUNT TIP 100MML (ENDOMECHANICALS) IMPLANT
TROCAR XCEL NON-BLD 11X100MML (ENDOMECHANICALS) IMPLANT
TROCAR Z-THREAD OPTICAL 5X100M (TROCAR) ×3 IMPLANT
TUBE CONNECTING 12X1/4 (SUCTIONS) ×4 IMPLANT
WARMER LAPAROSCOPE (MISCELLANEOUS) ×2 IMPLANT
WATER STERILE IRR 1000ML POUR (IV SOLUTION) ×2 IMPLANT
YANKAUER SUCT BULB TIP NO VENT (SUCTIONS) ×4 IMPLANT

## 2022-06-07 NOTE — Progress Notes (Signed)
PROGRESS NOTE    [name]  DXI:[id number] DOB: 07-19-1951 DOA: 05/26/2022 PCP: Silverio Decamp, MD   Brief Narrative:  71 year old with history of hypertension, hyperlipidemia, chronic A-fib on Eliquis status post ablation, hypothyroidism GERD and anxiety presented to the ER with low hemoglobin.  Patient recently suffering from multiple abdominal symptoms ongoing nausea vomiting poor appetite and diarrhea since 7/9.   Previous hospitalization 7/9-7/17, admitted to [place] with nausea vomiting and diarrhea, CT scan consistent with right-sided colitis and pneumatosis coli in the proximal hepatic flexure.  Initially treated with IV antibiotics, changed to Augmentin and discharged home where she completed her antibiotic course.     7/24 - (current admission) -noted to have bright red blood per rectum, instructed to discontinue Eliquis in the outpatient setting but with worsening symptoms of fatigue and weakness presented to the ED for symptoms noted to be profoundly anemic requiring transfusion, GI consulted. -Underwent upper GI endoscopy and colonoscopy on 06/02/2022.  Found to have colonic mass and underwent laparoscopic-assisted right colectomy on 06/07/2022.    Assessment & Plan:   Acute symptomatic blood loss anemia due to GI bleed: -Suspected infectious versus ischemic colitis -Hemoglobin 6.6 at intake status post 2 unit PRBC on 05/26/2022, with no further bleeding;   Repeat CT abd/pelvis shows minimally worsening colitis and due to persistent symptoms she underwent upper GI endoscopy which shows low-grade of narrowing Schatzki ring, small hiatal hernia.  Colonoscopy shows obstructing large mass found in the hepatic flexure.  Zosyn discontinued.  Biopsies were taken.  General surgery consulted.  CT chest ruled out metastatic disease.  Patient underwent laparoscopic-assisted right colectomy on 06/07/2022 by Dr. Rosendo Gros.   Paroxysmal atrial fibrillation: -Currently in  sinus rhythm, Eliquis on hold given above GI bleed and now that she has had surgery.  Reassessed patient and resume on anticoagulation once cleared by general surgery.   Essential hypertension:  -Continue metoprolol, irbesartan and Lasix   Hypothyroidism:  -Euthyroid on Synthroid replacement.   Acute hypoxic respiratory failure/atelectasis: Was requiring 2 L of oxygen yesterday, feels better, she is off of oxy all gen and on room air.  Continue with incentive spirometry.  Chronic insomnia/anxiety/depression: -Continue home meds ramelteon, trazodone, Effexor  GERD: Continue PPI  AKI: Resolved  Hypokalemia: Resolved  Rosacea?:  Redness around cheeks comes and goes.  Likely stress-induced.  No treatment indicated.  Bilateral lower extremity edema: Likely chronic.  She is on Lasix 10 mg p.o. twice daily at home which I increased to 20 mg p.o. daily on 06/05/2022.  DVT prophylaxis: SCD Code Status: Full code Family Communication:  None present at bedside.  Plan of care discussed with patient in length and she verbalized understanding and agreed with it. Disposition Plan: Will be discharged when cleared by general surgery.  Consultants:  GI  Procedures:  EGD Colonoscopy  Antimicrobials:  Zosyn  Status is: Inpatient     Subjective:  Patient seen and examined early morning today before she went for the surgery.  She had no new complaints.  Objective: Vitals:   06/07/22 1130 06/07/22 1145 06/07/22 1216 06/07/22 1443  BP: 126/60 125/61 138/71 (!) 145/77  Pulse: 71 71 72 76  Resp: '14 15 15   '$ Temp:  98 F (36.7 C) 97.8 F (36.6 C)   TempSrc:   Axillary   SpO2: 94% 94% 97%   Weight:      Height:        Intake/Output Summary (Last 24 hours) at 06/07/2022 1521 Last data  filed at 06/07/2022 1055 Gross per 24 hour  Intake 1600 ml  Output 250 ml  Net 1350 ml    Filed Weights   05/26/22 1043 06/07/22 0739  Weight: 111.2 kg 104.3 kg    Examination:  General exam:  Appears calm and comfortable, obese Respiratory system: Clear to auscultation. Respiratory effort normal. Cardiovascular system: S1 & S2 heard, RRR. No JVD, murmurs, rubs, gallops or clicks.  +2 pitting edema bilateral lower extremity. Gastrointestinal system: Abdomen is nondistended, soft and mild right lower quadrant tenderness. No organomegaly or masses felt. Normal bowel sounds heard. Central nervous system: Alert and oriented. No focal neurological deficits. Extremities: Symmetric 5 x 5 power. Skin: No rashes, lesions or ulcers.  Psychiatry: Judgement and insight appear normal. Mood & affect appropriate.   Data Reviewed: I have personally reviewed following labs and imaging studies  CBC: Recent Labs  Lab 06/01/22 0148 06/02/22 0259 06/03/22 0330 06/04/22 0200  WBC  --  3.6* 4.2 4.1  HGB 8.3* 10.1* 8.7* 8.8*  HCT 27.7* 32.6* 28.9* 28.7*  MCV  --  98.5 101.4* 100.0  PLT  --  131* 217 509    Basic Metabolic Panel: Recent Labs  Lab 06/01/22 0148 06/02/22 0259 06/03/22 0330 06/04/22 0200  NA 141 141 140 140  K 4.4 3.9 3.6 3.6  CL 107 104 106 103  CO2 '27 28 26 31  '$ GLUCOSE 98 88 118* 91  BUN 7* 8 6* 6*  CREATININE 0.96 0.89 0.82 0.82  CALCIUM 8.5* 8.7* 8.7* 8.9    GFR: Estimated Creatinine Clearance: 72.7 mL/min (by C-G formula based on SCr of 0.82 mg/dL). Liver Function Tests: No results for input(s): "AST", "ALT", "ALKPHOS", "BILITOT", "PROT", "ALBUMIN" in the last 168 hours.  No results for input(s): "LIPASE", "AMYLASE" in the last 168 hours.  No results for input(s): "AMMONIA" in the last 168 hours. Coagulation Profile: No results for input(s): "INR", "PROTIME" in the last 168 hours. Cardiac Enzymes: No results for input(s): "CKTOTAL", "CKMB", "CKMBINDEX", "TROPONINI" in the last 168 hours. BNP (last 3 results) No results for input(s): "PROBNP" in the last 8760 hours. HbA1C: No results for input(s): "HGBA1C" in the last 72 hours. CBG: No results for  input(s): "GLUCAP" in the last 168 hours. Lipid Profile: No results for input(s): "CHOL", "HDL", "LDLCALC", "TRIG", "CHOLHDL", "LDLDIRECT" in the last 72 hours. Thyroid Function Tests: No results for input(s): "TSH", "T4TOTAL", "FREET4", "T3FREE", "THYROIDAB" in the last 72 hours. Anemia Panel: No results for input(s): "VITAMINB12", "FOLATE", "FERRITIN", "TIBC", "IRON", "RETICCTPCT" in the last 72 hours. Sepsis Labs: No results for input(s): "PROCALCITON", "LATICACIDVEN" in the last 168 hours.   Recent Results (from the past 240 hour(s))  Surgical pcr screen     Status: None   Collection Time: 06/07/22  3:45 AM   Specimen: Nasal Mucosa; Nasal Swab  Result Value Ref Range Status   MRSA, PCR NEGATIVE NEGATIVE Final   Staphylococcus aureus NEGATIVE NEGATIVE Final    Comment: (NOTE) The Xpert SA Assay (FDA approved for NASAL specimens in patients 51 years of age and older), is one component of a comprehensive surveillance program. It is not intended to diagnose infection nor to guide or monitor treatment. Performed at [facility], [place] [address], [place], [place] [address]       Radiology Studies: No results found.  Scheduled Meds:  [START ON 06/08/2022] enoxaparin (LOVENOX) injection  40 mg Subcutaneous Q24H   feeding supplement  1 Container Oral TID BM   flecainide  50 mg Oral Q12H   furosemide  20 mg Oral BID   gabapentin  1,200 mg Oral QHS   icosapent Ethyl  2 g Oral BID   irbesartan  300 mg Oral Daily   levothyroxine  75 mcg Oral Q breakfast   metoprolol succinate  50 mg Oral Daily   pantoprazole  40 mg Oral Daily   ramelteon  8 mg Oral QHS   sodium chloride flush  3 mL Intravenous Q12H   traZODone  100 mg Oral QHS   venlafaxine XR  225 mg Oral Daily   Continuous Infusions:   LOS: 12 days    Darliss Cheney, MD [facility]  If 7PM-7AM, please contact night-coverage [email or web address] 06/07/2022, 3:21 PM

## 2022-06-07 NOTE — Progress Notes (Signed)
Day of Surgery   Subjective/Chief Complaint: Pt with no acute changes overnight   Objective: Vital signs in last 24 hours: Temp:  [97.7 F (36.5 C)-98.3 F (36.8 C)] 98.3 F (36.8 C) (08/08 0739) Pulse Rate:  [57-75] 75 (08/08 0739) Resp:  [17-18] 18 (08/08 0739) BP: (136-206)/(60-85) 206/85 (08/08 0739) SpO2:  [92 %-97 %] 94 % (08/08 0739) Weight:  [104.3 kg] 104.3 kg (08/08 0739) Last BM Date : 06/06/22  Intake/Output from previous day: No intake/output data recorded. Intake/Output this shift: No intake/output data recorded.  PE:  Constitutional: No acute distress, conversant, appears states age. Eyes: Anicteric sclerae, moist conjunctiva, no lid lag Lungs: Clear to auscultation bilaterally, normal respiratory effort CV: regular rate and rhythm, no murmurs, no peripheral edema, pedal pulses 2+ GI: Soft, no masses or hepatosplenomegaly, non-tender to palpation Skin: No rashes, palpation reveals normal turgor Psychiatric: appropriate judgment and insight, oriented to person, place, and time   No results found.  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 Mild LVH.    ABL anemia - hgb 6.6 on admission, s/p 2u pRBC 05/26/22. Hgb 8.8 today, stable.    FEN - Will restart FLD after  surgery VTE - SCDs, hgb stable - ok for chemical DVT ppx  ID - periopertive abx prophylactic    HTN HLD A. Fib s/p ablation on Eliquis at baseline - currently held Hypothyroidism   LOS: 12 days    Ralene Ok 06/07/2022

## 2022-06-07 NOTE — Op Note (Signed)
06/07/2022  10:43 AM  PATIENT:  [name]  71 y.o. female  PRE-OPERATIVE DIAGNOSIS:  Partially Obstructing Colon Mass  POST-OPERATIVE DIAGNOSIS:  Partially Obstructing Colon Mass  PROCEDURE:  Procedure(s): LAPAROSCOPIC ASSISTED RIGHT COLECTOMY  SURGEON:  Surgeon(s) and Role:    Ralene Ok, MD - Primary  PHYSICIAN ASSISTANT: Alferd Apa, PA-C   ANESTHESIA:   local and general  EBL:  50cc   BLOOD ADMINISTERED:none  DRAINS: none   LOCAL MEDICATIONS USED:  BUPIVICAINE   SPECIMEN:  Source of Specimen: Right and proximal transverse colon  DISPOSITION OF SPECIMEN:  PATHOLOGY  COUNTS:  YES  TOURNIQUET:  * No tourniquets in log *  DICTATION: .Dragon Dictation  Indication procedure: Patient is a 71 year old female comes in secondary to lower GI bleed.  Patient underwent endoscopy and was found to have a large hepatic flexure mass.  This was biopsied.  This found to be pseudopolyp in the biopsy portions that were received.  However secondary to her bleeding with the mass we proceeded to the operating room for a right hemicolectomy.  Findings: The tattoo was at the right hepatic flexure.  Patient went right hemicolectomy with ileal colonic anastomosis.  Details of procedure: After the patient was consented she was taken back to the OR and placed in supine position bilateral SCDs placed.  She underwent general endotracheal intubation.  Patient was then prepped and draped in standard fashion.  A timeout was called and all facts verified.  A reasonable technique was used to insufflate the abdomen to 15 mmHg in the left lower quadrant.  Subsequent to this a 5 Miller trocar and cannula placed intra-abdominal.  There was no injury to any intra-abdominal organs.  At this time the patient was positioned.  A 5 mm trocar was placed in the left upper quadrant, infraumbilical area.  At this time I was able to retract the colon cephalad.  There was an easily seen hepatic  flexure tattoo.  At this time I proceeded to retract the colon cephalad.  I created a mesenteric window just anterior to the duodenum.  I continued in the medial to lateral direction secondary to the amount of adipose tissue I decided to change to a lateral to medial direction.  The terminal ileum was identified.  At this time a mesenteric window was created.  I continued to free the lateral attachments of the right colon by incising the white line of Toldt distally.  I took this up to the hepatic flexure.  A portion of proximal to the middle colic artery was chosen as the distal transection area.  The omentum was taken off the colon laparoscopically.  At this time I was able to medialize the colon medially.  At this time #10 blade was used to create an upper midline incision.  Cautery to maintain hemostasis and dissection was taken down to the linea alba.  Peritoneum was entered bluntly.  At this time the [facility] wound retractor was then placed.  I was able to mobilize the transverse colon medially.  The appendix was seen the retroperitoneal attachments were incised.  This allowed me to present the cecum and right colon up to the wound.  There was some retroperitoneal attachments at the hepatic flexure.  These were incised with LigaSure.  At this time I was able to visualize the duodenum.  We were able to protect this area in the entire case.  At this time 29 GIA stapler was used to transect the terminal ileum and the  distal colon proximal to the middle colic artery.  At this time electrodevice was used to ligate the mesentery.  This was done at the base of the mesentery.  The specimen was not sent to pathology.  This time the corner of the staple lines were each removed.  53 GIA stapler was then used to create an anastomosis x 2.  The staple lines were offset.  A 3-0 silk was placed on apex stitch.  The omentum was brought over the staple line.  The abdomen was then irrigated out with sterile saline.  At  this time we proceeded with the colon protocol, gowns and gloves and instruments were changed.  At this time #1 PDS suture was used to reapproximate the skin in a running fashion x 2.  The skin was then closed with staples.  Trocar sites were also closed with staples.  A honeycomb dressing was then placed over the midline wound.  Patient tolerated procedure well was taken to the recovery room in stable condition.     PLAN OF CARE: Admit to inpatient   PATIENT DISPOSITION:  PACU - hemodynamically stable.   Delay start of Pharmacological VTE agent (>24hrs) due to surgical blood loss or risk of bleeding: yes

## 2022-06-07 NOTE — Anesthesia Preprocedure Evaluation (Signed)
Anesthesia Evaluation  Patient identified by MRN, date of birth, ID band Patient awake    Reviewed: Allergy & Precautions, NPO status , Patient's Chart, lab work & pertinent test results  History of Anesthesia Complications Negative for: history of anesthetic complications  Airway Mallampati: II  TM Distance: >3 FB Neck ROM: Full    Dental no notable dental hx.    Pulmonary neg pulmonary ROS,    Pulmonary exam normal breath sounds clear to auscultation       Cardiovascular hypertension, Pt. on medications Normal cardiovascular exam+ dysrhythmias Atrial Fibrillation  Rhythm:Regular Rate:Normal     Neuro/Psych Anxiety Depression negative neurological ROS     GI/Hepatic Neg liver ROS, GERD  ,  Endo/Other  Hypothyroidism Morbid obesity  Renal/GU negative Renal ROS  negative genitourinary   Musculoskeletal  (+) Arthritis , Osteoarthritis,    Abdominal (+) + obese,   Peds  Hematology  (+) Blood dyscrasia, anemia ,   Anesthesia Other Findings   Reproductive/Obstetrics                             Anesthesia Physical  Anesthesia Plan  ASA: 3  Anesthesia Plan: General   Post-op Pain Management: Dilaudid IV, Gabapentin PO (pre-op)* and Tylenol PO (pre-op)*   Induction: Intravenous  PONV Risk Score and Plan: 3 and Treatment may vary due to age or medical condition, Ondansetron, Dexamethasone and Midazolam  Airway Management Planned: Oral ETT  Additional Equipment: None  Intra-op Plan:   Post-operative Plan: Extubation in OR  Informed Consent: I have reviewed the patients History and Physical, chart, labs and discussed the procedure including the risks, benefits and alternatives for the proposed anesthesia with the patient or authorized representative who has indicated his/her understanding and acceptance.       Plan Discussed with:   Anesthesia Plan Comments:          Anesthesia Quick Evaluation

## 2022-06-07 NOTE — Progress Notes (Signed)
Patient received from PACU. Vitals monitored incision assess , scant drainage in abdominal dressing. Marland Kitchen

## 2022-06-07 NOTE — Anesthesia Procedure Notes (Signed)
Procedure Name: Intubation Date/Time: 06/07/2022 9:07 AM  Performed by: Janene Harvey, CRNAPre-anesthesia Checklist: Patient identified, Emergency Drugs available, Suction available and Patient being monitored Patient Re-evaluated:Patient Re-evaluated prior to induction Oxygen Delivery Method: Circle System Utilized Preoxygenation: Pre-oxygenation with 100% oxygen Induction Type: IV induction Ventilation: Mask ventilation without difficulty Laryngoscope Size: Mac and 4 Grade View: Grade I Tube type: Oral Tube size: 7.0 mm Number of attempts: 1 Airway Equipment and Method: Stylet and Oral airway Placement Confirmation: ETT inserted through vocal cords under direct vision, positive ETCO2 and breath sounds checked- equal and bilateral Secured at: 21 cm Tube secured with: Tape Dental Injury: Teeth and Oropharynx as per pre-operative assessment

## 2022-06-07 NOTE — Anesthesia Postprocedure Evaluation (Signed)
Anesthesia Post Note  Patient: PRANAVI AURE  Procedure(s) Performed: LAPAROSCOPIC PARTIAL COLECTOMY; POSSIBLE OPEN PARTIAL COLECTOMY     Patient location during evaluation: PACU Anesthesia Type: General Level of consciousness: awake and alert Pain management: pain level controlled Vital Signs Assessment: post-procedure vital signs reviewed and stable Respiratory status: spontaneous breathing, nonlabored ventilation and respiratory function stable Cardiovascular status: blood pressure returned to baseline and stable Postop Assessment: no apparent nausea or vomiting Anesthetic complications: no   No notable events documented.  Last Vitals:  Vitals:   06/07/22 1145 06/07/22 1216  BP: 125/61 138/71  Pulse: 71 72  Resp: 15 15  Temp: 36.7 C 36.6 C  SpO2: 94% 97%    Last Pain:  Vitals:   06/07/22 1216  TempSrc: Axillary  PainSc:                  Lynda Rainwater

## 2022-06-07 NOTE — Transfer of Care (Signed)
Immediate Anesthesia Transfer of Care Note  Patient: [name]  Procedure(s) Performed: LAPAROSCOPIC PARTIAL COLECTOMY; POSSIBLE OPEN PARTIAL COLECTOMY  Patient Location: PACU  Anesthesia Type:General  Level of Consciousness: drowsy and patient cooperative  Airway & Oxygen Therapy: Patient Spontanous Breathing and Patient connected to face mask oxygen  Post-op Assessment: Report given to RN and Post -op Vital signs reviewed and stable  Post vital signs: Reviewed and stable  Last Vitals:  Vitals Value Taken Time  BP 137/66 06/07/22 1100  Temp 36.7 C 06/07/22 1100  Pulse 70 06/07/22 1106  Resp 19 06/07/22 1106  SpO2 93 % 06/07/22 1106  Vitals shown include unvalidated device data.  Last Pain:  Vitals:   06/07/22 1100  TempSrc:   PainSc: Asleep         Complications: No notable events documented.

## 2022-06-08 ENCOUNTER — Encounter (HOSPITAL_COMMUNITY): Payer: Self-pay | Admitting: General Surgery

## 2022-06-08 DIAGNOSIS — I482 Chronic atrial fibrillation, unspecified: Secondary | ICD-10-CM | POA: Diagnosis not present

## 2022-06-08 DIAGNOSIS — D649 Anemia, unspecified: Secondary | ICD-10-CM | POA: Diagnosis not present

## 2022-06-08 DIAGNOSIS — K6389 Other specified diseases of intestine: Secondary | ICD-10-CM

## 2022-06-08 DIAGNOSIS — D62 Acute posthemorrhagic anemia: Secondary | ICD-10-CM | POA: Diagnosis not present

## 2022-06-08 LAB — COMPREHENSIVE METABOLIC PANEL
ALT: 17 U/L (ref 0–44)
AST: 28 U/L (ref 15–41)
Albumin: 2.1 g/dL — ABNORMAL LOW (ref 3.5–5.0)
Alkaline Phosphatase: 70 U/L (ref 38–126)
Anion gap: 7 (ref 5–15)
BUN: 11 mg/dL (ref 8–23)
CO2: 29 mmol/L (ref 22–32)
Calcium: 8.1 mg/dL — ABNORMAL LOW (ref 8.9–10.3)
Chloride: 101 mmol/L (ref 98–111)
Creatinine, Ser: 1.34 mg/dL — ABNORMAL HIGH (ref 0.44–1.00)
GFR, Estimated: 42 mL/min — ABNORMAL LOW (ref >60)
Glucose, Bld: 128 mg/dL — ABNORMAL HIGH (ref 70–99)
Potassium: 3.3 mmol/L — ABNORMAL LOW (ref 3.5–5.1)
Sodium: 137 mmol/L (ref 135–145)
Total Bilirubin: 0.7 mg/dL (ref 0.3–1.2)
Total Protein: 4.5 g/dL — ABNORMAL LOW (ref 6.5–8.1)

## 2022-06-08 LAB — CBC
HCT: 27.9 % — ABNORMAL LOW (ref 36.0–46.0)
HCT: 25.5 % — ABNORMAL LOW (ref 36.0–46.0)
Hemoglobin: 8.7 g/dL — ABNORMAL LOW (ref 12.0–15.0)
Hemoglobin: 7.7 g/dL — ABNORMAL LOW (ref 12.0–15.0)
MCH: 30.4 pg (ref 26.0–34.0)
MCH: 30.3 pg (ref 26.0–34.0)
MCHC: 31.2 g/dL (ref 30.0–36.0)
MCHC: 30.2 g/dL (ref 30.0–36.0)
MCV: 97.6 fL (ref 80.0–100.0)
MCV: 100.4 fL — ABNORMAL HIGH (ref 80.0–100.0)
Platelets: 209 10*3/uL (ref 150–400)
Platelets: 182 10*3/uL (ref 150–400)
RBC: 2.86 MIL/uL — ABNORMAL LOW (ref 3.87–5.11)
RBC: 2.54 MIL/uL — ABNORMAL LOW (ref 3.87–5.11)
RDW: 17.2 % — ABNORMAL HIGH (ref 11.5–15.5)
RDW: 17.7 % — ABNORMAL HIGH (ref 11.5–15.5)
WBC: 7.1 10*3/uL (ref 4.0–10.5)
WBC: 7.4 10*3/uL (ref 4.0–10.5)
nRBC: 0 % (ref 0.0–0.2)
nRBC: 0 % (ref 0.0–0.2)

## 2022-06-08 LAB — BASIC METABOLIC PANEL
Anion gap: 10 (ref 5–15)
BUN: 5 mg/dL — ABNORMAL LOW (ref 8–23)
CO2: 31 mmol/L (ref 22–32)
Calcium: 8.4 mg/dL — ABNORMAL LOW (ref 8.9–10.3)
Chloride: 101 mmol/L (ref 98–111)
Creatinine, Ser: 0.75 mg/dL (ref 0.44–1.00)
GFR, Estimated: >60 mL/min (ref >60)
Glucose, Bld: 116 mg/dL — ABNORMAL HIGH (ref 70–99)
Potassium: 3.4 mmol/L — ABNORMAL LOW (ref 3.5–5.1)
Sodium: 142 mmol/L (ref 135–145)

## 2022-06-08 LAB — GLUCOSE, CAPILLARY: Glucose-Capillary: 126 mg/dL — ABNORMAL HIGH (ref 70–99)

## 2022-06-08 LAB — MAGNESIUM: Magnesium: 1.8 mg/dL (ref 1.7–2.4)

## 2022-06-08 MED ORDER — LACTATED RINGERS IV BOLUS
1000.0000 mL | Freq: Once | INTRAVENOUS | Status: AC
  Administered 2022-06-08: 1000 mL via INTRAVENOUS

## 2022-06-08 MED ORDER — BUTALBITAL-APAP-CAFFEINE 50-325-40 MG PO TABS: 1.0000 | ORAL_TABLET | Freq: Four times a day (QID) | ORAL | Status: DC | PRN

## 2022-06-08 MED ORDER — POTASSIUM CHLORIDE 10 MEQ/100ML IV SOLN
10.0000 meq | INTRAVENOUS | Status: AC
  Administered 2022-06-08 – 2022-06-09 (×4): 10 meq via INTRAVENOUS
  Filled 2022-06-08 (×4): qty 100

## 2022-06-08 MED ORDER — NALOXONE HCL 0.4 MG/ML IJ SOLN
INTRAMUSCULAR | Status: AC
  Filled 2022-06-08: qty 1

## 2022-06-08 MED ORDER — NALOXONE HCL 0.4 MG/ML IJ SOLN
0.4000 mg | INTRAMUSCULAR | Status: DC | PRN
Start: 2022-06-08 — End: 2022-06-14
  Administered 2022-06-08: 0.4 mg via INTRAVENOUS

## 2022-06-08 MED ORDER — POTASSIUM CHLORIDE 20 MEQ PO PACK
40.0000 meq | PACK | Freq: Once | ORAL | Status: AC
Start: 2022-06-08 — End: 2022-06-08
  Administered 2022-06-08: 40 meq via ORAL
  Filled 2022-06-08: qty 2

## 2022-06-08 MED ORDER — ACETAMINOPHEN 500 MG PO TABS
1000.0000 mg | ORAL_TABLET | Freq: Three times a day (TID) | ORAL | Status: DC
  Administered 2022-06-08 – 2022-06-13 (×14): 1000 mg via ORAL
  Filled 2022-06-08 (×14): qty 2

## 2022-06-08 NOTE — Progress Notes (Signed)
Occupational Therapy Treatment Patient Details Name: [name] MRN: [id number] DOB: 1951-07-15 Today's Date: 06/08/2022   History of present illness 71 yo female with onset of abd pain, N/V/D. and rectal bleeding was admitted on 7/27,Status post laparoscopic assisted right colectomy by Dr. Rosendo Gros on 8/8.  PMHx:  hypertension, hyperlipidemia, chronic A-fib on Eliquis status post ablation, hypothyroidism GERD and anxiety   OT comments  Pt with increased pain soreness s/p partial colectomy R sided on 8/8. Pt feeling better today and reports pain to be expected. Pt tolerating session fairly well for bed mobility with supervisionA and use of rail and step pivot to recliner with minguardA with RW. Pt set-upA for grooming unsupported in sitting. Pt education provided for energy conservation. Pt able to state 2 techniques to assist with OOB ADL or mobility. Pt willing to mobilize more, but pain too severe with second sit to stand to initiate more steps. Pt requiring 2-3L O2 with exertion to increase O2 sats  >90%; 1L <88% O2. Pt would greatly benefit from continued OT skilled services. OT following acutely.   Recommendations for follow up therapy are one component of a multi-disciplinary discharge planning process, led by the attending physician.  Recommendations may be updated based on patient status, additional functional criteria and insurance authorization.    Follow Up Recommendations  Home health OT    Assistance Recommended at Discharge Intermittent Supervision/Assistance  Patient can return home with the following  A little help with walking and/or transfers;A little help with bathing/dressing/bathroom;Assistance with cooking/housework;Direct supervision/assist for medications management;Direct supervision/assist for financial management;Assist for transportation;Help with stairs or ramp for entrance   Equipment Recommendations  BSC/3in1    Recommendations for Other Services       Precautions / Restrictions Precautions Precautions: Fall Precaution Comments: watch O2 Restrictions Weight Bearing Restrictions: No       Mobility Bed Mobility Overal bed mobility: Needs Assistance Bed Mobility: Supine to Sit     Supine to sit: Supervision     General bed mobility comments: supervision for all aspects    Transfers Overall transfer level: Needs assistance Equipment used: Rolling walker (2 wheels) Transfers: Sit to/from Stand Sit to Stand: Supervision           General transfer comment: no AD     Balance Overall balance assessment: Needs assistance Sitting-balance support: Feet supported Sitting balance-Leahy Scale: Fair         Standing balance comment: standing briefly; pain in stomach region                           ADL either performed or assessed with clinical judgement   ADL Overall ADL's : Needs assistance/impaired     Grooming: Set up;Sitting;Cueing for safety                               Functional mobility during ADLs: Min guard;Cueing for safety General ADL Comments: Pt feeling better today and reports pain to be expected. Pt tolerating session fairly well for bed mobility and step pivot to recliner. Pt set-upA for grooming unsupported in sitting. pt education provided for energy conservation. Pt able to state 2 techniques to assist with OOB ADL or mobility. Pt willing to mobilize more, but pain too severe with second sit to stand to initiate more steps.    Extremity/Trunk Assessment Upper Extremity Assessment Upper Extremity Assessment: Generalized weakness   Lower Extremity  Assessment Lower Extremity Assessment: Generalized weakness RLE Deficits / Details: edema LLE Deficits / Details: edema        Vision   Vision Assessment?: No apparent visual deficits   Perception     Praxis      Cognition Arousal/Alertness: Awake/alert Behavior During Therapy: WFL for tasks  assessed/performed Overall Cognitive Status: Within Functional Limits for tasks assessed                                          Exercises      Shoulder Instructions       General Comments Pt requiring 2-3L O2 with exertion to increase O2 sats  >90%; 1L <88% O2.    Pertinent Vitals/ Pain       Pain Assessment Pain Assessment: No/denies pain  Home Living                                          Prior Functioning/Environment              Frequency  Min 2X/week        Progress Toward Goals  OT Goals(current goals can now be found in the care plan section)  Progress towards OT goals: Progressing toward goals  Acute Rehab OT Goals Patient Stated Goal: to eventually go home OT Goal Formulation: With patient Time For Goal Achievement: 06/13/22 Potential to Achieve Goals: Good  Plan Discharge plan remains appropriate    Co-evaluation                 AM-PAC OT "6 Clicks" Daily Activity     Outcome Measure   Help from another person eating meals?: None Help from another person taking care of personal grooming?: A Little Help from another person toileting, which includes using toliet, bedpan, or urinal?: A Lot Help from another person bathing (including washing, rinsing, drying)?: A Lot Help from another person to put on and taking off regular upper body clothing?: None Help from another person to put on and taking off regular lower body clothing?: A Lot 6 Click Score: 17    End of Session Equipment Utilized During Treatment: Oxygen  OT Visit Diagnosis: Unsteadiness on feet (R26.81);Pain Pain - part of body:  (lower stomach at incision)   Activity Tolerance Patient limited by fatigue   Patient Left in chair;with call bell/phone within reach   Nurse Communication Mobility status        Time: 8119-1478 OT Time Calculation (min): 36 min  Charges: OT General Charges $OT Visit: 1 Visit OT Treatments $Self  Care/Home Management : 8-22 mins $Therapeutic Activity: 8-22 mins  Jefferey Pica, OTR/L Acute Rehabilitation Services Office: [phone]   VHQIONG  C 06/08/2022, 1:54 PM

## 2022-06-08 NOTE — Discharge Instructions (Signed)
[facility]      [facility] [phone]  OPEN ABDOMINAL SURGERY: POST OP INSTRUCTIONS  Always review your discharge instruction sheet given to you by the facility where your surgery was performed.  IF YOU HAVE DISABILITY OR FAMILY LEAVE FORMS, YOU MUST BRING THEM TO THE OFFICE FOR PROCESSING.  PLEASE DO NOT GIVE THEM TO YOUR DOCTOR.  A prescription for pain medication may be given to you upon discharge.  Take your pain medication as prescribed, if needed.  If narcotic pain medicine is not needed, then you may take acetaminophen (Tylenol) or ibuprofen (Advil) as needed. Take your usually prescribed medications unless otherwise directed. If you need a refill on your pain medication, please contact your pharmacy. They will contact our office to request authorization.  Prescriptions will not be filled after 5pm or on week-ends. You should follow a light diet the first few days after arrival home, such as soup and crackers, pudding, etc.unless your doctor has advised otherwise. A high-fiber, low fat diet can be resumed as tolerated.   Be sure to include lots of fluids daily. Most patients will experience some swelling and bruising on the chest and neck area.  Ice packs will help.  Swelling and bruising can take several days to resolve Most patients will experience some swelling and bruising in the area of the incision. Ice pack will help. Swelling and bruising can take several days to resolve..  It is common to experience some constipation if taking pain medication after surgery.  Increasing fluid intake and taking a stool softener will usually help or prevent this problem from occurring.  A mild laxative (Milk of Magnesia or Miralax) should be taken according to package directions if there are no bowel movements after 48 hours.  You may have steri-strips (small skin tapes) in place directly over the incision.  These strips should be left on the skin for 7-10 days.  If your surgeon used skin  glue on the incision, you may shower in 24 hours.  The glue will flake off over the next 2-3 weeks.  Any sutures or staples will be removed at the office during your follow-up visit. You may find that a light gauze bandage over your incision may keep your staples from being rubbed or pulled. You may shower and replace the bandage daily. ACTIVITIES:  You may resume regular (light) daily activities beginning the next day--such as daily self-care, walking, climbing stairs--gradually increasing activities as tolerated.  You may have sexual intercourse when it is comfortable.  Refrain from any heavy lifting or straining until approved by your doctor. You may drive when you no longer are taking prescription pain medication, you can comfortably wear a seatbelt, and you can safely maneuver your car and apply brakes Return to Work: ___________________________________ You should see your doctor in the office for a follow-up appointment approximately two weeks after your surgery.  Make sure that you call for this appointment within a day or two after you arrive home to insure a convenient appointment time. OTHER INSTRUCTIONS:  _____________________________________________________________ _____________________________________________________________  WHEN TO CALL YOUR DOCTOR: Fever over 101.0 Inability to urinate Nausea and/or vomiting Extreme swelling or bruising Continued bleeding from incision. Increased pain, redness, or drainage from the incision. Difficulty swallowing or breathing Muscle cramping or spasms. Numbness or tingling in hands or feet or around lips.  The clinic staff is available to answer your questions during regular business hours.  Please don't hesitate to call and ask to speak to one of   the nurses if you have concerns.  For further questions, please visit [email or web address]  

## 2022-06-08 NOTE — Progress Notes (Signed)
PROGRESS NOTE    [name]  QQV:[id number] DOB: 1951/05/01 DOA: 05/26/2022 PCP: Silverio Decamp, MD   Brief Narrative:  71 year old with history of hypertension, hyperlipidemia, chronic A-fib on Eliquis status post ablation, hypothyroidism GERD and anxiety presented to the ER with low hemoglobin.  Patient recently suffering from multiple abdominal symptoms ongoing nausea vomiting poor appetite and diarrhea since 7/9.   Previous hospitalization 7/9-7/17, admitted to [place] with nausea vomiting and diarrhea, CT scan consistent with right-sided colitis and pneumatosis coli in the proximal hepatic flexure.  Initially treated with IV antibiotics, changed to Augmentin and discharged home where she completed her antibiotic course.     7/24 - (current admission) -noted to have bright red blood per rectum, instructed to discontinue Eliquis in the outpatient setting but with worsening symptoms of fatigue and weakness presented to the ED for symptoms noted to be profoundly anemic requiring transfusion, GI consulted. -Underwent upper GI endoscopy and colonoscopy on 06/02/2022.  Found to have colonic mass and underwent laparoscopic-assisted right colectomy on 06/07/2022.    Assessment & Plan:   Acute symptomatic blood loss anemia due to GI bleed: -Suspected infectious versus ischemic colitis -Hemoglobin 6.6 at intake status post 2 unit PRBC on 05/26/2022, with no further bleeding;   Repeat CT abd/pelvis shows minimally worsening colitis and due to persistent symptoms she underwent upper GI endoscopy which shows low-grade of narrowing Schatzki ring, small hiatal hernia.  Colonoscopy shows obstructing large mass found in the hepatic flexure.  Zosyn discontinued.  Biopsies were taken.  General surgery consulted.  CT chest ruled out metastatic disease.  Patient underwent laparoscopic-assisted right colectomy on 06/07/2022 by Dr. Rosendo Gros.    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 Rosendo Gros on 8/8 -Postop care by general surgery, continue with clear liquid diet  Paroxysmal atrial fibrillation: -Currently in sinus rhythm, Eliquis on hold given above GI bleed and now that she has had surgery.  Reassessed patient and resume on anticoagulation once cleared by general surgery.   Essential hypertension:  -Continue metoprolol, irbesartan and Lasix   Hypothyroidism:  -Euthyroid on Synthroid replacement.   Acute hypoxic respiratory failure/atelectasis: Was requiring 2 L of oxygen yesterday, feels better, she is off of oxy all gen and on room air.  Continue with incentive spirometry.  Chronic insomnia/anxiety/depression: -Continue home meds ramelteon, trazodone, Effexor  GERD: Continue PPI  AKI: Resolved  Hypokalemia: Resolved  Rosacea?:  Redness around cheeks comes and goes.  Likely stress-induced.  No treatment indicated.  Bilateral lower extremity edema: Likely chronic.  She is on Lasix 10 mg p.o. twice daily at home which I increased to 20 mg p.o. daily on 06/05/2022.  DVT prophylaxis: SCD, lovenox Code Status: Full code Family Communication:  None present at bedside.  Plan of care discussed with patient in length and she verbalized understanding and agreed with it. Disposition Plan: Will be discharged when cleared by general surgery.  Consultants:  GI General Surgery  Procedures:  EGD Colonoscopy  Antimicrobials:  Zosyn  Status is: Inpatient     Subjective:  Does report some nausea, no vomiting, reports some abdominal pain controlled with oxycodone  Objective: Vitals:   06/08/22 0300 06/08/22 0733 06/08/22 0923 06/08/22 1045  BP: (!) 142/62 (!) 149/75  135/63 Marland Kitchen)  118/56  Pulse: 87  72   Resp: '16 18  19  '$ Temp: 98.7 F (37.1 C) 98.1 F (36.7 C)  98 F (36.7 C)  TempSrc: Axillary Oral  Oral  SpO2: 90% 92%  90%  Weight:      Height:        Intake/Output Summary (Last 24 hours) at 06/08/2022 1318 Last data filed at 06/08/2022 0645 Gross per 24 hour  Intake --  Output 200 ml  Net -200 ml   Filed Weights   05/26/22 1043 06/07/22 0739  Weight: 111.2 kg 104.3 kg    Examination:  Awake Alert, Oriented X 3, No new F.N deficits, Normal affect Symmetrical Chest wall movement, Good air movement bilaterally, CTAB RRR,No Gallops,Rubs or new Murmurs, No Parasternal Heave +ve B.Sounds, Abd Soft, expected postoperative tenderness, midline wound covered with honeycomb mesh  no Cyanosis, Clubbing or edema, No new Rash or bruise    Data Reviewed: I have personally reviewed following labs and imaging studies  CBC: Recent Labs  Lab 06/02/22 0259 06/03/22 0330 06/04/22 0200 06/08/22 0711  WBC 3.6* 4.2 4.1 7.1  HGB 10.1* 8.7* 8.8* 8.7*  HCT 32.6* 28.9* 28.7* 27.9*  MCV 98.5 101.4* 100.0 97.6  PLT 131* 217 216 938   Basic Metabolic Panel: Recent Labs  Lab 06/02/22 0259 06/03/22 0330 06/04/22 0200 06/08/22 0711  NA 141 140 140 142  K 3.9 3.6 3.6 3.4*  CL 104 106 103 101  CO2 '28 26 31 31  '$ GLUCOSE 88 118* 91 116*  BUN 8 6* 6* 5*  CREATININE 0.89 0.82 0.82 0.75  CALCIUM 8.7* 8.7* 8.9 8.4*   GFR: Estimated Creatinine Clearance: 74.5 mL/min (by C-G formula based on SCr of 0.75 mg/dL). Liver Function Tests: No results for input(s): "AST", "ALT", "ALKPHOS", "BILITOT", "PROT", "ALBUMIN" in the last 168 hours.  No results for input(s): "LIPASE", "AMYLASE" in the last 168 hours.  No results for input(s): "AMMONIA" in the last 168 hours. Coagulation Profile: No results for input(s): "INR", "PROTIME" in the last 168 hours. Cardiac Enzymes: No results for input(s): "CKTOTAL", "CKMB", "CKMBINDEX", "TROPONINI" in the last 168  hours. BNP (last 3 results) No results for input(s): "PROBNP" in the last 8760 hours. HbA1C: No results for input(s): "HGBA1C" in the last 72 hours. CBG: No results for input(s): "GLUCAP" in the last 168 hours. Lipid Profile: No results for input(s): "CHOL", "HDL", "LDLCALC", "TRIG", "CHOLHDL", "LDLDIRECT" in the last 72 hours. Thyroid Function Tests: No results for input(s): "TSH", "T4TOTAL", "FREET4", "T3FREE", "THYROIDAB" in the last 72 hours. Anemia Panel: No results for input(s): "VITAMINB12", "FOLATE", "FERRITIN", "TIBC", "IRON", "RETICCTPCT" in the last 72 hours. Sepsis Labs: No results for input(s): "PROCALCITON", "LATICACIDVEN" in the last 168 hours.   Recent Results (from the past 240 hour(s))  Surgical pcr screen     Status: None   Collection Time: 06/07/22  3:45 AM   Specimen: Nasal Mucosa; Nasal Swab  Result Value Ref Range Status   MRSA, PCR NEGATIVE NEGATIVE Final   Staphylococcus aureus NEGATIVE NEGATIVE Final    Comment: (NOTE) The Xpert SA Assay (FDA approved for NASAL specimens in patients 38 years of age and older), is one component of a comprehensive surveillance program. It is not intended to diagnose infection nor to guide or monitor treatment. Performed at [facility], [place] [address], [place], [place] [address]       Radiology Studies: No results found.  Scheduled Meds:  acetaminophen  1,000 mg Oral Q8H  enoxaparin (LOVENOX) injection  40 mg Subcutaneous Q24H   feeding supplement  1 Container Oral TID BM   flecainide  50 mg Oral Q12H   furosemide  20 mg Oral BID   gabapentin  1,200 mg Oral QHS   icosapent Ethyl  2 g Oral BID   irbesartan  300 mg Oral Daily   levothyroxine  75 mcg Oral Q breakfast   metoprolol succinate  50 mg Oral Daily   pantoprazole  40 mg Oral Daily   ramelteon  8 mg Oral QHS   sodium chloride flush  3 mL Intravenous Q12H   traZODone  100 mg Oral QHS   venlafaxine XR  225 mg Oral Daily   Continuous  Infusions:   LOS: 13 days    Phillips Climes, MD [facility]  If 7PM-7AM, please contact night-coverage [email or web address] 06/08/2022, 1:18 PM

## 2022-06-08 NOTE — Plan of Care (Signed)

## 2022-06-08 NOTE — Progress Notes (Signed)
Physical Therapy Treatment Patient Details Name: [name] MRN: [id number] DOB: 1951/09/27 Today's Date: 06/08/2022   History of Present Illness 71 yo female with onset of abd pain, N/V/D. and rectal bleeding was admitted on 7/27,Status post laparoscopic assisted right colectomy by Dr. Rosendo Gros on 8/8.  PMHx:  hypertension, hyperlipidemia, chronic A-fib on Eliquis status post ablation, hypothyroidism GERD and anxiety    PT Comments    Pt received OOB in chair on arrival and agreeable to session, however limited by abdominal pain and general fatigue. Pt demonstrating ambulation with RW for increased stability this session s/p partial colectomy R sided on 8/8 with up to min assist to manage RW negotiating obstacles in room. Distance and session limited by pt fatigue and pain. Encouraged pt to continue mobility OOB as tolerated and educated pt on log roll technique and other abdominal precautions for comfort with pt and daughter verbalizing understanding. Pt continues to benefit from skilled PT services to progress toward functional mobility goals.    Recommendations for follow up therapy are one component of a multi-disciplinary discharge planning process, led by the attending physician.  Recommendations may be updated based on patient status, additional functional criteria and insurance authorization.  Follow Up Recommendations  Home health PT     Assistance Recommended at Discharge Frequent or constant Supervision/Assistance  Patient can return home with the following A little help with walking and/or transfers;A little help with bathing/dressing/bathroom;Assistance with cooking/housework;Direct supervision/assist for medications management;Assist for transportation;Help with stairs or ramp for entrance   Equipment Recommendations  Rolling walker (2 wheels)    Recommendations for Other Services       Precautions / Restrictions Precautions Precautions: Fall;Other (comment) Precaution  Comments: watch O2, abdominal for comfort Restrictions Weight Bearing Restrictions: No     Mobility  Bed Mobility Overal bed mobility: Needs Assistance             General bed mobility comments: pt OOB in recliner pre and post session, educated and demonstrated log roll technique for abdominal comfrot    Transfers Overall transfer level: Needs assistance Equipment used: Rolling walker (2 wheels) Transfers: Sit to/from Stand Sit to Stand: Min guard           General transfer comment: min guard for safety    Ambulation/Gait Ambulation/Gait assistance: Min guard, Min assist Gait Distance (Feet): 30 Feet (x2) Assistive device: Rolling walker (2 wheels) Gait Pattern/deviations: Step-through pattern, Antalgic Gait velocity: decr     General Gait Details: slow antalgic gait with RW, distance limited secondary to pain and pt stated tolerance   Stairs             Wheelchair Mobility    Modified Rankin (Stroke Patients Only)       Balance Overall balance assessment: Needs assistance Sitting-balance support: Feet supported Sitting balance-Leahy Scale: Fair     Standing balance support: Bilateral upper extremity supported Standing balance-Leahy Scale: Poor Standing balance comment: BUE reliance on RW                            Cognition Arousal/Alertness: Awake/alert Behavior During Therapy: WFL for tasks assessed/performed Overall Cognitive Status: Within Functional Limits for tasks assessed                                          Exercises  General Comments General comments (skin integrity, edema, etc.): SpO2 >90% on 2L      Pertinent Vitals/Pain Pain Assessment Pain Assessment: Faces Faces Pain Scale: Hurts little more Pain Location: abdomen Pain Descriptors / Indicators: Sore Pain Intervention(s): Monitored during session, Limited activity within patient's tolerance    Home Living                           Prior Function            PT Goals (current goals can now be found in the care plan section) Acute Rehab PT Goals PT Goal Formulation: With patient Time For Goal Achievement: 06/13/22    Frequency    Min 3X/week      PT Plan      Co-evaluation              AM-PAC PT "6 Clicks" Mobility   Outcome Measure  Help needed turning from your back to your side while in a flat bed without using bedrails?: A Little Help needed moving from lying on your back to sitting on the side of a flat bed without using bedrails?: A Little Help needed moving to and from a bed to a chair (including a wheelchair)?: A Little Help needed standing up from a chair using your arms (e.g., wheelchair or bedside chair)?: A Little Help needed to walk in hospital room?: A Little Help needed climbing 3-5 steps with a railing? : A Lot 6 Click Score: 17    End of Session Equipment Utilized During Treatment: Oxygen Activity Tolerance: Patient tolerated treatment well;Patient limited by pain Patient left: in chair;with call bell/phone within reach;with family/visitor present Nurse Communication: Mobility status PT Visit Diagnosis: Unsteadiness on feet (R26.81);Muscle weakness (generalized) (M62.81);Dizziness and giddiness (R42)     Time: 8938-1017 PT Time Calculation (min) (ACUTE ONLY): 21 min  Charges:  $Therapeutic Activity: 8-22 mins                     [name] R. PTA Acute Rehabilitation Services Office: [phone]    [name] 06/08/2022, 2:52 PM

## 2022-06-08 NOTE — Progress Notes (Signed)
Dr. Velia Meyer notified that after Bolus BP is still 80/46, 85/53. Updated MD on lab results, potassium 3.3, Bun and Creatinine elevated than this am. H and H 7.7 and 25.5. Also notified MD that bladder scan shoed 63m, no urine output since 0915. MD asked for last 24 Hr I/o, updated. MD ok with holding pt's Pm meds bc she is extremely drowsy. Including Tambocor, HR 52. Patient will talk but fall right back to sleep. Updated Md with last 24 hr intake of pain medication as he requested. New order received for potassium replacement and 1 more lit of bolus.  Patient sleeping in bed with eyes closed, responds to voice and stated she feels fine and just sleepy. Call bell within reach. Care continues.

## 2022-06-08 NOTE — Progress Notes (Signed)
[facility] Progress Note  1 Day Post-Op  Subjective: CC:  C/o abd pain, worse with movement. She is currently up in chair with OT. Tolerating FLD. Denies flatus/BM yet.  Objective: Vital signs in last 24 hours: Temp:  [97.8 F (36.6 C)-98.7 F (37.1 C)] 98.1 F (36.7 C) (08/09 0733) Pulse Rate:  [69-87] 72 (08/09 0923) Resp:  [14-20] 18 (08/09 0733) BP: (125-164)/(60-87) 135/63 (08/09 0923) SpO2:  [90 %-97 %] 92 % (08/09 0733) Last BM Date : 06/06/22  Intake/Output from previous day: 08/08 0701 - 08/09 0700 In: 1600 [I.V.:1500; IV Piggyback:100] Out: 450 [Urine:200; Blood:250] Intake/Output this shift: No intake/output data recorded.  PE: Gen:  Alert, NAD, appears uncomfortable Card:  Regular rate and rhythm Pulm:  slightly labored ORA Abd: Soft, mild distention, midline incision c/d/I w honeycomb in place, port sites dressings clean and dry, appropriately tender Skin: warm and dry, no rashes  Psych: A&Ox3   Lab Results:  Recent Labs    06/08/22 0711  WBC 7.1  HGB 8.7*  HCT 27.9*  PLT 209   BMET Recent Labs    06/08/22 0711  NA 142  K 3.4*  CL 101  CO2 31  GLUCOSE 116*  BUN 5*  CREATININE 0.75  CALCIUM 8.4*   PT/INR No results for input(s): "LABPROT", "INR" in the last 72 hours. CMP     Component Value Date/Time   NA 142 06/08/2022 0711   K 3.4 (L) 06/08/2022 0711   CL 101 06/08/2022 0711   CO2 31 06/08/2022 0711   GLUCOSE 116 (H) 06/08/2022 0711   BUN 5 (L) 06/08/2022 0711   CREATININE 0.75 06/08/2022 0711   CREATININE 0.85 05/25/2022 0000   CALCIUM 8.4 (L) 06/08/2022 0711   PROT 5.5 (L) 05/29/2022 1634   ALBUMIN 2.8 (L) 05/29/2022 1634   AST 40 05/29/2022 1634   ALT 27 05/29/2022 1634   ALKPHOS 78 05/29/2022 1634   BILITOT 0.9 05/29/2022 1634   GFRNONAA >60 06/08/2022 0711   GFRNONAA 64 03/11/2020 1202   GFRAA 74 03/11/2020 1202   Lipase     Component Value Date/Time   LIPASE 25 05/29/2022 1634        Studies/Results: No results found.  Anti-infectives: Anti-infectives (From admission, onward)    Start     Dose/Rate Route Frequency Ordered Stop   06/07/22 2200  cefoTEtan (CEFOTAN) 1 g in sodium chloride 0.9 % 100 mL IVPB        1 g 200 mL/hr over 30 Minutes Intravenous Every 12 hours 06/07/22 1216 06/08/22 1015   06/07/22 0830  cefoTEtan (CEFOTAN) 2 g in sodium chloride 0.9 % 100 mL IVPB        2 g 200 mL/hr over 30 Minutes Intravenous On call to O.R. 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 COLON, TRANSVERSE AND DESCENDING, POLYPECTOMY:  Tubular adenoma.  Negative for high-grade dysplasia.   B. 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  A small fragment of normal colonic mucosa is present.  Please see comment.   - CEA  0.9, no evidence of metastasis on CT C/A/P   POD#1 S/P LAPAROSCOPIC ASSISTED RIGHT COLECTOMY 06/07/22 Dr. Rosendo Gros - afebrile, VSS, hgb stable - await surgical path, currently no evidence of malignancy  - continue FLD and await bowel function - lovenox daily - abx to stop today - PT/OT   ABL anemia - hgb 6.6 on admission, s/p 2u pRBC 05/26/22. Hgb 8.8 today, stable.   FEN - Would keep on CLD VTE - SCDs, lovenox ID - cefotetan   HTN HLD A. Fib s/p ablation on Eliquis at baseline - currently held Hypothyroidism    LOS: 13 days   I reviewed nursing notes, Consultant GI notes, hospitalist notes, last 24 h vitals and pain scores, last 48 h intake and output, last 24 h labs and trends, and last 24 h imaging  results.  Obie Dredge, PA-C [facility] Please see Amion for pager number during day hours 7:00am-4:30pm

## 2022-06-08 NOTE — Progress Notes (Addendum)
Patient's BP was 81/86, 15-20 min after Trendelenburg position, it was 88/53. On call MD Dr. Velia Meyer was paged and notified. Also updated MD on patient being very drowsy all day, not responding much to narcan. Patient is pale, bradycardic in 50's. Bp and HR was not low during the day. Also notified that patient had no urine output since this am and bladder scan does now show anything. Also notified pt has generalised edema, that's her baseline. Got order for IV bolus, stat labs and stat EKG.  Patient resting in bed, drowsy but responds well, she states she cannot keep her eyes open. Call bell within reach. Care continues.

## 2022-06-09 DIAGNOSIS — D62 Acute posthemorrhagic anemia: Secondary | ICD-10-CM | POA: Diagnosis not present

## 2022-06-09 DIAGNOSIS — D649 Anemia, unspecified: Secondary | ICD-10-CM | POA: Diagnosis not present

## 2022-06-09 DIAGNOSIS — I959 Hypotension, unspecified: Secondary | ICD-10-CM

## 2022-06-09 DIAGNOSIS — N179 Acute kidney failure, unspecified: Secondary | ICD-10-CM | POA: Diagnosis not present

## 2022-06-09 LAB — CBC
HCT: 26.1 % — ABNORMAL LOW (ref 36.0–46.0)
Hemoglobin: 8 g/dL — ABNORMAL LOW (ref 12.0–15.0)
MCH: 30.9 pg (ref 26.0–34.0)
MCHC: 30.7 g/dL (ref 30.0–36.0)
MCV: 100.8 fL — ABNORMAL HIGH (ref 80.0–100.0)
Platelets: 187 10*3/uL (ref 150–400)
RBC: 2.59 MIL/uL — ABNORMAL LOW (ref 3.87–5.11)
RDW: 17.7 % — ABNORMAL HIGH (ref 11.5–15.5)
WBC: 7.2 10*3/uL (ref 4.0–10.5)
nRBC: 0 % (ref 0.0–0.2)

## 2022-06-09 LAB — BASIC METABOLIC PANEL WITH GFR
Anion gap: 6 (ref 5–15)
BUN: 11 mg/dL (ref 8–23)
CO2: 29 mmol/L (ref 22–32)
Calcium: 8.2 mg/dL — ABNORMAL LOW (ref 8.9–10.3)
Chloride: 102 mmol/L (ref 98–111)
Creatinine, Ser: 1.36 mg/dL — ABNORMAL HIGH (ref 0.44–1.00)
GFR, Estimated: 42 mL/min — ABNORMAL LOW
Glucose, Bld: 106 mg/dL — ABNORMAL HIGH (ref 70–99)
Potassium: 3.8 mmol/L (ref 3.5–5.1)
Sodium: 137 mmol/L (ref 135–145)

## 2022-06-09 LAB — SURGICAL PATHOLOGY

## 2022-06-09 LAB — MAGNESIUM: Magnesium: 1.8 mg/dL (ref 1.7–2.4)

## 2022-06-09 LAB — PREPARE RBC (CROSSMATCH)

## 2022-06-09 LAB — PHOSPHORUS: Phosphorus: 3.1 mg/dL (ref 2.5–4.6)

## 2022-06-09 MED ORDER — LACTATED RINGERS IV SOLN: INTRAVENOUS | Status: DC

## 2022-06-09 MED ORDER — OXYCODONE HCL 5 MG PO TABS
5.0000 mg | ORAL_TABLET | ORAL | Status: DC | PRN
  Administered 2022-06-10 – 2022-06-13 (×12): 5 mg via ORAL
  Filled 2022-06-09 (×12): qty 1

## 2022-06-09 MED ORDER — FUROSEMIDE 10 MG/ML IJ SOLN
20.0000 mg | Freq: Once | INTRAMUSCULAR | Status: AC
Start: 2022-06-09 — End: 2022-06-09
  Administered 2022-06-09: 20 mg via INTRAVENOUS
  Filled 2022-06-09: qty 2

## 2022-06-09 MED ORDER — SODIUM CHLORIDE 0.9% IV SOLUTION
Freq: Once | INTRAVENOUS | Status: AC
Start: 2022-06-09 — End: 2022-06-09

## 2022-06-09 MED ORDER — MIDODRINE HCL 5 MG PO TABS
5.0000 mg | ORAL_TABLET | Freq: Three times a day (TID) | ORAL | Status: DC
  Administered 2022-06-09 – 2022-06-10 (×4): 5 mg via ORAL
  Filled 2022-06-09 (×4): qty 1

## 2022-06-09 NOTE — Progress Notes (Signed)
TR[facility] night cross cover note:   I was notified by RN of the patient's soft blood pressures, initially in the 80s over 50s in this patient who underwent right hemicolectomy on 06/08/2022 in the setting of presenting colitis.  Other vital signs appear stable, although HR running a little bit lower, into the high 50's bpm. This is relative to systolic blood pressure in the 90s around 1600 on 06/08/2022.  Patient drowsy, although this appears unchanged from earlier.  She had received a dose of Narcan earlier in the day, without any significant reported improvement in these findings.  Per my discussions with RN, 24-hour I's and O's notable for the following: Ins: 400 cc.  Out: 200 cc of urine as well as an unmeasured urine output around 915 on the morning of 06/08/2022.  I ordered stat CMP, serum magnesium level, and CBC, with preliminary findings notable for serum potassium level 3.3.  Subsequently ordered 40 meq of IV potassium chloride in addition to a total of 2 L of LR bolus, following which blood pressure has improved to 108/65.  I also ordered EKG to further evaluate.  Following the aforementioned 2 L LR bolus, I have placed order for continuous LR at 125 cc/h.     Babs Bertin, DO Hospitalist

## 2022-06-09 NOTE — Progress Notes (Signed)
[facility] Progress Note  2 Days Post-Op  Subjective: CC:  Pain controlled, no new complaints. Denies flatus or BM yet. Tolerating FLD. States she walked in the hall yesterday.   Per chart review pt had soft blood pressures and some somnolence last night -- she got 2, 1,000 mL LR boluses yesterday. She also got narcan but has not been taking much narcotic.   Objective: Vital signs in last 24 hours: Temp:  [97.4 F (36.3 C)-98 F (36.7 C)] 97.7 F (36.5 C) (08/10 0717) Pulse Rate:  [24-72] 53 (08/10 0717) Resp:  [10-20] 19 (08/10 0717) BP: (80-135)/(42-69) 96/53 (08/10 0717) SpO2:  [90 %-100 %] 96 % (08/10 0717) Last BM Date : 06/06/22  Intake/Output from previous day: 08/09 0701 - 08/10 0700 In: 3247.6 [P.O.:240; I.V.:2595.6; IV Piggyback:412.1] Out: -  Intake/Output this shift: No intake/output data recorded.  PE: Gen:  Alert, NAD, appears uncomfortable Card:  Regular rate and rhythm Pulm:  slightly labored ORA Abd: Soft, mild upper abd distention, midline incision c/d/I w honeycomb in place, port sites dressings clean and dry, appropriately tender Skin: warm and dry, no rashes  Psych: A&Ox3   Lab Results:  Recent Labs    06/08/22 2153 06/09/22 0549  WBC 7.4 7.2  HGB 7.7* 8.0*  HCT 25.5* 26.1*  PLT 182 187   BMET Recent Labs    06/08/22 2153 06/09/22 0549  NA 137 137  K 3.3* 3.8  CL 101 102  CO2 29 29  GLUCOSE 128* 106*  BUN 11 11  CREATININE 1.34* 1.36*  CALCIUM 8.1* 8.2*   PT/INR No results for input(s): "LABPROT", "INR" in the last 72 hours. CMP     Component Value Date/Time   NA 137 06/09/2022 0549   K 3.8 06/09/2022 0549   CL 102 06/09/2022 0549   CO2 29 06/09/2022 0549   GLUCOSE 106 (H) 06/09/2022 0549   BUN 11 06/09/2022 0549   CREATININE 1.36 (H) 06/09/2022 0549   CREATININE 0.85 05/25/2022 0000   CALCIUM 8.2 (L) 06/09/2022 0549   PROT 4.5 (L) 06/08/2022 2153   ALBUMIN 2.1 (L) 06/08/2022 2153   AST 28 06/08/2022 2153    ALT 17 06/08/2022 2153   ALKPHOS 70 06/08/2022 2153   BILITOT 0.7 06/08/2022 2153   GFRNONAA 42 (L) 06/09/2022 0549   GFRNONAA 64 03/11/2020 1202   GFRAA 74 03/11/2020 1202   Lipase     Component Value Date/Time   LIPASE 25 05/29/2022 1634       Studies/Results: No results found.  Anti-infectives: Anti-infectives (From admission, onward)    Start     Dose/Rate Route Frequency Ordered Stop   06/07/22 2200  cefoTEtan (CEFOTAN) 1 g in sodium chloride 0.9 % 100 mL IVPB        1 g 200 mL/hr over 30 Minutes Intravenous Every 12 hours 06/07/22 1216 06/08/22 1015   06/07/22 0830  cefoTEtan (CEFOTAN) 2 g in sodium chloride 0.9 % 100 mL IVPB        2 g 200 mL/hr over 30 Minutes Intravenous On call to O.R. 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  COLON, TRANSVERSE AND DESCENDING, POLYPECTOMY:  Tubular adenoma.  Negative for high-grade dysplasia.   B. 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  A small fragment of normal colonic mucosa is present.  Please see comment.   - CEA 0.9, no evidence of metastasis on CT C/A/P   POD#2 S/P LAPAROSCOPIC ASSISTED RIGHT COLECTOMY 06/07/22 Dr. Rosendo Gros - afebrile, VSS, hgb stable  - await surgical path, currently no evidence of malignancy  - continue FLD and await bowel function - lovenox daily - PT/OT   FEN - FLD, ensure VTE - SCDs, lovenox ID - cefotetan  ABL anemia - hgb 6.6 on admission, s/p 2u pRBC 05/26/22. Hgb 8.8> 7.7 > 8.0, stable.  AKI - Cr 0.75 > 1.34 > 1.36 today. UOP appears marginal but possible pt  voided without it being documented. Per primary team. Sinus bracycardia - per primary team HTN HLD A. Fib s/p ablation on Eliquis at baseline - currently held Hypothyroidism    LOS: 14 days   I reviewed nursing notes, Consultant GI notes, hospitalist notes, last 24 h vitals and pain scores, last 48 h intake and output, last 24 h labs and trends, and last 24 h imaging results.  Obie Dredge, PA-C [facility] Please see Amion for pager number during day hours 7:00am-4:30pm

## 2022-06-09 NOTE — Progress Notes (Addendum)
Mobility Specialist - Progress Note   Pre-mobility: 60bpm HR, 120/45 (68) BP, 98% SpO2  Post-mobility: 61bpm HR, 115/69 (84) BP, 94%SPO2   06/09/22 1539  Mobility  Activity Ambulated with assistance in room  Level of Assistance Minimal assist, patient does 75% or more  Assistive Device Front wheel walker  Distance Ambulated (ft) 60 ft (71f x 2)  Activity Response Tolerated well  $Mobility charge 1 Mobility   Pt was found in bed and was agreeable to mobilize in the room. C/o a level 8/10 pain on her right abdomin and around her front. Min-A from sit to stand and contact guard while ambulating. Was able to walk from bed to door x2 with a break in between. Returned to bed with all necessities in reach and RN in room. RN was notified of session.  BFerd HibbsMobility Specialist

## 2022-06-09 NOTE — Care Management Important Message (Signed)
Important Message  Patient Details  Name: [name] MRN: [id number] Date of Birth: Nov 21, 1950   Medicare Important Message Given:  Yes     [name] 06/09/2022, 8:02 AM

## 2022-06-09 NOTE — Progress Notes (Signed)
Updated on call MD dr. Velia Meyer with am labs. Notified pt's bladder scan around 4am was 85 cc with no urge to urinate,around 5 am pt had wet bed pad and gown. Bladder scan showed 33m. Patient has large abdomen, not sure if bladder scan is accurate. Pt does not complain of discomfort or urge to urinate. Last BP 96/56. Pt keeps saying she is fine and I am worrying too much. Still drowsy. MD stated he is going to pass on to day shift hospitalist. .

## 2022-06-09 NOTE — Progress Notes (Addendum)
PROGRESS NOTE    [name]  UYQ:[id number] DOB: October 15, 1951 DOA: 05/26/2022 PCP: Silverio Decamp, MD   Brief Narrative:   71 year old with history of hypertension, hyperlipidemia, chronic A-fib on Eliquis status post ablation, hypothyroidism GERD and anxiety presented to the ER with low hemoglobin.  Patient recently suffering from multiple abdominal symptoms ongoing nausea vomiting poor appetite and diarrhea since 7/9.   Previous hospitalization 7/9-7/17, admitted to [facility] with nausea vomiting and diarrhea, CT scan consistent with right-sided colitis and pneumatosis coli in the proximal hepatic flexure.  Initially treated with IV antibiotics, changed to Augmentin and discharged home where she completed her antibiotic course.     7/24 - (current admission) -noted to have bright red blood per rectum, instructed to discontinue Eliquis in the outpatient setting but with worsening symptoms of fatigue and weakness presented to the ED for symptoms noted to be profoundly anemic requiring transfusion, GI consulted. -Underwent upper GI endoscopy and colonoscopy on 06/02/2022.  Found to have colonic mass and underwent laparoscopic-assisted right colectomy on 06/07/2022.    Assessment & Plan:   Acute symptomatic blood loss anemia due to GI bleed: -Suspected infectious versus ischemic colitis -Hemoglobin 6.6 at intake status post 2 unit PRBC on 05/26/2022, with no further bleeding;  -  Repeat CT abd/pelvis shows minimally worsening colitis and due to persistent symptoms she underwent upper GI endoscopy which shows low-grade of narrowing Schatzki ring, small hiatal hernia.  Colonoscopy shows obstructing large mass found in the hepatic flexure.  Zosyn discontinued.  Biopsies were taken.  General surgery consulted.  CT chest ruled out metastatic disease.  Patient underwent laparoscopic-assisted right colectomy on 06/07/2022 by Dr. Rosendo Gros. -Continue to monitor CBC closely, transfuse as  needed    Partially obstructing colonic mass at the hepatic flexure  - s/p colonoscopy 06/02/22 w/ hepatic flexure mass, polyp in transverse and descending colon; path showing tubular adenoma, negative for high-grade dysplasia. -CEA 0.9, no evidence of metastasis on CT chest/abdomen/pelvis -Status post laparoscopic assisted right colectomy by Dr. Rosendo Gros on 8/8 -Postop care by general surgery, continue with full liquid diet -Awaiting final results of pathology  Paroxysmal atrial fibrillation: -Currently in sinus rhythm, Eliquis on hold given above GI bleed and now that she has had surgery.  Reassessed patient and resume on anticoagulation once cleared by general surgery.   Essential hypertension:  -Given soft blood pressure and bradycardia will hold metoprolol, as well she was hypotensive so we will hold irbesartan as well   Hypothyroidism:  -Euthyroid on Synthroid replacement.   Acute hypoxic respiratory failure/atelectasis: Was requiring 2 L of oxygen yesterday, feels better, she is off of oxy all gen and on room air.  Continue with incentive spirometry.  Chronic insomnia/anxiety/depression: -Continue home meds ramelteon, trazodone, Effexor  GERD: Continue PPI  AKI: Resolved  Hypokalemia: Resolved  Rosacea?:  Redness around cheeks comes and goes.  Likely stress-induced.  No treatment indicated.  Bilateral lower extremity edema: Likely chronic.  She is on Lasix 10 mg p.o. twice daily at home which I increased to 20 mg p.o. daily on 06/05/2022.  Hypotension/bradycardia -Blood pressure has been soft overnight where she required multiple fluid boluses, and IV fluid at 125 cc/h, I will DC today, and will hold her metoprolol and verapamil, will start on midodrine. -Will transfuse 1 unit PRBC given patient is symptomatic with low blood pressure, increased lethargy.  Urinary retention - Will insert Foley catheter especially she will need close ins and outs in the setting of  hypotension  DVT prophylaxis: SCD, lovenox Code Status: Full code Family Communication: Discussed with daughter at bedside 06/08/2022 Disposition Plan: Will be discharged when cleared by general surgery.  Consultants:  GI General Surgery  Procedures:  EGD Colonoscopy  Antimicrobials:  Zosyn  Status is: Inpatient     Subjective:  Does report some nausea, no vomiting, reports some abdominal pain controlled with oxycodone  Objective: Vitals:   06/09/22 0544 06/09/22 0600 06/09/22 0717 06/09/22 1035  BP: (!) 101/54 (!) 106/53 (!) 96/53 (!) 103/58  Pulse: 60 (!) 56 (!) 53   Resp: '17 13 19   '$ Temp:   97.7 F (36.5 C)   TempSrc:   Oral   SpO2: 96% 96% 96%   Weight:      Height:        Intake/Output Summary (Last 24 hours) at 06/09/2022 1116 Last data filed at 06/09/2022 0600 Gross per 24 hour  Intake 3247.63 ml  Output --  Net 3247.63 ml   Filed Weights   05/26/22 1043 06/07/22 0739  Weight: 111.2 kg 104.3 kg    Examination:  Somnolent, but wakes up and answer questions appropriately Symmetrical Chest wall movement, Good air movement bilaterally, CTAB RRR,No Gallops,Rubs or new Murmurs, No Parasternal Heave  +ve B.Sounds, Abd Soft, expected postoperative tenderness, midline wound covered with honeycomb mesh  no Cyanosis, Clubbing, +1 edema.  Data Reviewed: I have personally reviewed following labs and imaging studies  CBC: Recent Labs  Lab 06/03/22 0330 06/04/22 0200 06/08/22 0711 06/08/22 2153 06/09/22 0549  WBC 4.2 4.1 7.1 7.4 7.2  HGB 8.7* 8.8* 8.7* 7.7* 8.0*  HCT 28.9* 28.7* 27.9* 25.5* 26.1*  MCV 101.4* 100.0 97.6 100.4* 100.8*  PLT 217 216 209 182 456   Basic Metabolic Panel: Recent Labs  Lab 06/03/22 0330 06/04/22 0200 06/08/22 0711 06/08/22 2153 06/09/22 0549  NA 140 140 142 137 137  K 3.6 3.6 3.4* 3.3* 3.8  CL 106 103 101 101 102  CO2 '26 31 31 29 29  '$ GLUCOSE 118* 91 116* 128* 106*  BUN 6* 6* 5* 11 11  CREATININE 0.82 0.82 0.75  1.34* 1.36*  CALCIUM 8.7* 8.9 8.4* 8.1* 8.2*  MG  --   --   --  1.8 1.8  PHOS  --   --   --   --  3.1   GFR: Estimated Creatinine Clearance: 43.8 mL/min (A) (by C-G formula based on SCr of 1.36 mg/dL (H)). Liver Function Tests: Recent Labs  Lab 06/08/22 2153  AST 28  ALT 17  ALKPHOS 70  BILITOT 0.7  PROT 4.5*  ALBUMIN 2.1*    No results for input(s): "LIPASE", "AMYLASE" in the last 168 hours.  No results for input(s): "AMMONIA" in the last 168 hours. Coagulation Profile: No results for input(s): "INR", "PROTIME" in the last 168 hours. Cardiac Enzymes: No results for input(s): "CKTOTAL", "CKMB", "CKMBINDEX", "TROPONINI" in the last 168 hours. BNP (last 3 results) No results for input(s): "PROBNP" in the last 8760 hours. HbA1C: No results for input(s): "HGBA1C" in the last 72 hours. CBG: Recent Labs  Lab 06/08/22 1745  GLUCAP 126*   Lipid Profile: No results for input(s): "CHOL", "HDL", "LDLCALC", "TRIG", "CHOLHDL", "LDLDIRECT" in the last 72 hours. Thyroid Function Tests: No results for input(s): "TSH", "T4TOTAL", "FREET4", "T3FREE", "THYROIDAB" in the last 72 hours. Anemia Panel: No results for input(s): "VITAMINB12", "FOLATE", "FERRITIN", "TIBC", "IRON", "RETICCTPCT" in the last 72 hours. Sepsis Labs: No results for input(s): "PROCALCITON", "LATICACIDVEN" in the last 168  hours.   Recent Results (from the past 240 hour(s))  Surgical pcr screen     Status: None   Collection Time: 06/07/22  3:45 AM   Specimen: Nasal Mucosa; Nasal Swab  Result Value Ref Range Status   MRSA, PCR NEGATIVE NEGATIVE Final   Staphylococcus aureus NEGATIVE NEGATIVE Final    Comment: (NOTE) The Xpert SA Assay (FDA approved for NASAL specimens in patients 72 years of age and older), is one component of a comprehensive surveillance program. It is not intended to diagnose infection nor to guide or monitor treatment. Performed at [facility], [place] [address], [place],  [place] [address]       Radiology Studies: No results found.  Scheduled Meds:  acetaminophen  1,000 mg Oral Q8H   enoxaparin (LOVENOX) injection  40 mg Subcutaneous Q24H   feeding supplement  1 Container Oral TID BM   flecainide  50 mg Oral Q12H   furosemide  20 mg Oral BID   gabapentin  1,200 mg Oral QHS   icosapent Ethyl  2 g Oral BID   levothyroxine  75 mcg Oral Q breakfast   midodrine  5 mg Oral TID WC   pantoprazole  40 mg Oral Daily   ramelteon  8 mg Oral QHS   sodium chloride flush  3 mL Intravenous Q12H   traZODone  100 mg Oral QHS   venlafaxine XR  225 mg Oral Daily   Continuous Infusions:   LOS: 14 days    Phillips Climes, MD [facility]  If 7PM-7AM, please contact night-coverage [email or web address] 06/09/2022, 11:16 AM

## 2022-06-09 NOTE — Progress Notes (Signed)
Updated Dr. Velia Meyer pt's Bp is 108/65 after 2nd bolus, still no urine. Order received for Continuous IVF. Pt resting in bed with eyes closed, pt said she is still very sleepy. Easily aroused  and answers all questions. Care continues.

## 2022-06-10 ENCOUNTER — Inpatient Hospital Stay (HOSPITAL_COMMUNITY): Payer: Medicare Other

## 2022-06-10 DIAGNOSIS — D649 Anemia, unspecified: Secondary | ICD-10-CM | POA: Diagnosis not present

## 2022-06-10 DIAGNOSIS — D62 Acute posthemorrhagic anemia: Secondary | ICD-10-CM | POA: Diagnosis not present

## 2022-06-10 DIAGNOSIS — I480 Paroxysmal atrial fibrillation: Secondary | ICD-10-CM | POA: Diagnosis not present

## 2022-06-10 LAB — BASIC METABOLIC PANEL
Anion gap: 5 (ref 5–15)
BUN: 9 mg/dL (ref 8–23)
CO2: 28 mmol/L (ref 22–32)
Calcium: 8 mg/dL — ABNORMAL LOW (ref 8.9–10.3)
Chloride: 104 mmol/L (ref 98–111)
Creatinine, Ser: 0.9 mg/dL (ref 0.44–1.00)
GFR, Estimated: >60 mL/min (ref >60)
Glucose, Bld: 107 mg/dL — ABNORMAL HIGH (ref 70–99)
Potassium: 3.5 mmol/L (ref 3.5–5.1)
Sodium: 137 mmol/L (ref 135–145)

## 2022-06-10 LAB — CBC
HCT: 28.5 % — ABNORMAL LOW (ref 36.0–46.0)
Hemoglobin: 8.9 g/dL — ABNORMAL LOW (ref 12.0–15.0)
MCH: 30.3 pg (ref 26.0–34.0)
MCHC: 31.2 g/dL (ref 30.0–36.0)
MCV: 96.9 fL (ref 80.0–100.0)
Platelets: 175 10*3/uL (ref 150–400)
RBC: 2.94 MIL/uL — ABNORMAL LOW (ref 3.87–5.11)
RDW: 18.7 % — ABNORMAL HIGH (ref 11.5–15.5)
WBC: 5.5 10*3/uL (ref 4.0–10.5)
nRBC: 0 % (ref 0.0–0.2)

## 2022-06-10 LAB — TYPE AND SCREEN
ABO/RH(D): A NEG
Antibody Screen: NEGATIVE
Unit division: 0

## 2022-06-10 LAB — BPAM RBC
Blood Product Expiration Date: 202309022359
ISSUE DATE / TIME: 202308101208
Unit Type and Rh: 600

## 2022-06-10 MED ORDER — METOPROLOL SUCCINATE ER 50 MG PO TB24
50.0000 mg | ORAL_TABLET | Freq: Every day | ORAL | Status: DC
  Administered 2022-06-10 – 2022-06-13 (×4): 50 mg via ORAL
  Filled 2022-06-10 (×4): qty 1

## 2022-06-10 MED ORDER — CHLORHEXIDINE GLUCONATE CLOTH 2 % EX PADS: 6.0000 | MEDICATED_PAD | Freq: Every day | CUTANEOUS | Status: DC

## 2022-06-10 MED ORDER — ENOXAPARIN SODIUM 100 MG/ML IJ SOSY
100.0000 mg | PREFILLED_SYRINGE | Freq: Two times a day (BID) | INTRAMUSCULAR | Status: DC
  Administered 2022-06-10 (×2): 100 mg via SUBCUTANEOUS
  Filled 2022-06-10 (×4): qty 1

## 2022-06-10 NOTE — Progress Notes (Signed)
ANTICOAGULATION CONSULT NOTE - Initial Consult  Pharmacy Consult for Lovenox Indication: atrial fibrillation  No Known Allergies  Patient Measurements: Height: '5\' 3"'$  (160 cm) Weight: 104.3 kg (230 lb) IBW/kg (Calculated) : 52.4  Vital Signs: Temp: 97.9 F (36.6 C) (08/11 0347) Temp Source: Axillary (08/11 0347) BP: 108/56 (08/11 0347) Pulse Rate: 68 (08/11 0635)  Labs: Recent Labs    06/08/22 2153 06/09/22 0549 06/10/22 0250  HGB 7.7* 8.0* 8.9*  HCT 25.5* 26.1* 28.5*  PLT 182 187 175  CREATININE 1.34* 1.36* 0.90    Estimated Creatinine Clearance: 66.3 mL/min (by C-G formula based on SCr of 0.9 mg/dL).   Medical History: Past Medical History:  Diagnosis Date   Anxiety    Atrial fibrillation (HCC)    Depression    GERD (gastroesophageal reflux disease)    Hypertension    Plaque psoriasis    Thyroid disease     Medications:  No current facility-administered medications on file prior to encounter.   Current Outpatient Medications on File Prior to Encounter  Medication Sig Dispense Refill   amoxicillin-clavulanate (AUGMENTIN) 875-125 MG tablet Take 1 tablet by mouth 2 (two) times daily.     aspirin EC 81 MG tablet Take 1 tablet (81 mg total) by mouth daily. 90 tablet 3   flecainide (TAMBOCOR) 50 MG tablet Take 50 mg by mouth 2 (two) times a day.     furosemide (LASIX) 20 MG tablet Take 0.5 tablets (10 mg total) by mouth daily. 30 tablet 3   gabapentin (NEURONTIN) 600 MG tablet Take 2 tablets (1,200 mg total) by mouth at bedtime. 180 tablet 3   icosapent Ethyl (VASCEPA) 1 g capsule Take 2 capsules (2 g total) by mouth 2 (two) times daily. 360 capsule 3   levothyroxine (SYNTHROID) 75 MCG tablet Take 1 tablet (75 mcg total) by mouth daily with breakfast. 90 tablet 3   metoprolol succinate (TOPROL-XL) 50 MG 24 hr tablet TAKE ONE TABLET BY MOUTH DAILY (Patient taking differently: Take 50 mg by mouth daily.) 90 tablet 3   Multiple Vitamins-Minerals (MULTIVITAMIN WITH  MINERALS) tablet Take 1 tablet by mouth daily.     ondansetron (ZOFRAN-ODT) 8 MG disintegrating tablet Take 1 tablet (8 mg total) by mouth every 8 (eight) hours as needed for nausea. 20 tablet 3   oxyCODONE-acetaminophen (PERCOCET/ROXICET) 5-325 MG tablet Take 1 tablet by mouth every 8 (eight) hours as needed. 90 tablet 0   pantoprazole (PROTONIX) 40 MG tablet TAKE ONE TABLET BY MOUTH DAILY (Patient taking differently: Take 40 mg by mouth daily.) 90 tablet 3   ramelteon (ROZEREM) 8 MG tablet 1 tablet p.o. at [place] 90 tablet 3   traZODone (DESYREL) 100 MG tablet Take 1 tablet (100 mg total) by mouth at bedtime. 90 tablet 3   valsartan-hydrochlorothiazide (DIOVAN-HCT) 320-25 MG tablet Take 0.5 tablets by mouth daily.     venlafaxine XR (EFFEXOR-XR) 75 MG 24 hr capsule Take 3 capsules (225 mg total) by mouth daily. 90 capsule 11   AMBULATORY NON FORMULARY MEDICATION Medication Name: Rollator 1 each 0   AMBULATORY NON FORMULARY MEDICATION Motorized scooter for use daily 1 each 0   AMBULATORY NON FORMULARY MEDICATION Shower seat for use daily 1 each 0     Assessment: 71 y.o. female with h/o Afib, Eliquis on hold, for Lovenox  Goal of Therapy:  Anti-Xa level 0.6-1 units/ml 4hrs after LMWH dose given Monitor platelets by anticoagulation protocol: Yes   Plan:  Lovenox 100 mg SQ q12h  Nilda Riggs  [place] 06/10/2022,7:22 AM

## 2022-06-10 NOTE — Progress Notes (Signed)
Physical Therapy Treatment Patient Details Name: [name] MRN: [id number] DOB: [birth year] Today's Date: 06/10/2022   History of Present Illness 71 yo female with onset of abd pain, N/V/D. and rectal bleeding was admitted on 7/27,Status post laparoscopic assisted right colectomy by Dr. Rosendo Gros on 8/8.  PMHx:  hypertension, hyperlipidemia, chronic A-fib on Eliquis status post ablation, hypothyroidism GERD and anxiety    PT Comments    Pt received supine and agreeable to session with slow but steady progress towards acute goals, however pt continues to be limited by abdominal pain despite pre medication. Pt able to come to sitting EOB with log roll technique for comfort with supervision and light cues to initiate. Pt with stable rise to RW and able to demonstrate stable ambulation with RW for increased distance with light cues . Pt agreeable to time up in chair at end of session. Pt continues to benefit from skilled PT services to progress toward functional mobility goals.    Recommendations for follow up therapy are one component of a multi-disciplinary discharge planning process, led by the attending physician.  Recommendations may be updated based on patient status, additional functional criteria and insurance authorization.  Follow Up Recommendations  Home health PT     Assistance Recommended at Discharge Frequent or constant Supervision/Assistance  Patient can return home with the following A little help with walking and/or transfers;A little help with bathing/dressing/bathroom;Assistance with cooking/housework;Direct supervision/assist for medications management;Assist for transportation;Help with stairs or ramp for entrance   Equipment Recommendations  Rolling walker (2 wheels)    Recommendations for Other Services       Precautions / Restrictions Precautions Precautions: Fall;Other (comment) Precaution Comments: watch O2, abdominal for comfort Restrictions Weight Bearing  Restrictions: No     Mobility  Bed Mobility Overal bed mobility: Needs Assistance Bed Mobility: Supine to Sit     Supine to sit: Supervision     General bed mobility comments: increased time and use of rail, cues for log roll technique for comfrot    Transfers Overall transfer level: Needs assistance Equipment used: Rolling walker (2 wheels) Transfers: Sit to/from Stand Sit to Stand: Min guard           General transfer comment: min guard for safety    Ambulation/Gait Ambulation/Gait assistance: Min guard, Min assist Gait Distance (Feet): 60 Feet Assistive device: Rolling walker (2 wheels) Gait Pattern/deviations: Step-through pattern, Antalgic Gait velocity: decr     General Gait Details: slow antalgic gait with RW, distance limited secondary to pain   Stairs             Wheelchair Mobility    Modified Rankin (Stroke Patients Only)       Balance Overall balance assessment: Needs assistance Sitting-balance support: Feet supported Sitting balance-Leahy Scale: Fair     Standing balance support: Bilateral upper extremity supported Standing balance-Leahy Scale: Poor Standing balance comment: BUE reliance on RW                            Cognition Arousal/Alertness: Awake/alert Behavior During Therapy: WFL for tasks assessed/performed Overall Cognitive Status: Within Functional Limits for tasks assessed                                          Exercises      General Comments General comments (skin integrity, edema, etc.): SpO2  variable on RA with poor pleth,SpO2 89% at rest in recliner at end of session, replaced 2L SpO2 92%      Pertinent Vitals/Pain Pain Assessment Pain Assessment: Faces Faces Pain Scale: Hurts little more Pain Location: abdomen Pain Descriptors / Indicators: Sore Pain Intervention(s): Premedicated before session, Monitored during session, Limited activity within patient's tolerance     Home Living                          Prior Function            PT Goals (current goals can now be found in the care plan section) Acute Rehab PT Goals Patient Stated Goal: to get home and feel better PT Goal Formulation: With patient Time For Goal Achievement: 06/13/22    Frequency    Min 3X/week      PT Plan      Co-evaluation              AM-PAC PT "6 Clicks" Mobility   Outcome Measure  Help needed turning from your back to your side while in a flat bed without using bedrails?: A Little Help needed moving from lying on your back to sitting on the side of a flat bed without using bedrails?: A Little Help needed moving to and from a bed to a chair (including a wheelchair)?: A Little Help needed standing up from a chair using your arms (e.g., wheelchair or bedside chair)?: A Little Help needed to walk in hospital room?: A Little Help needed climbing 3-5 steps with a railing? : A Lot 6 Click Score: 17    End of Session Equipment Utilized During Treatment: Oxygen Activity Tolerance: Patient tolerated treatment well;Patient limited by pain Patient left: in chair;with call bell/phone within reach Nurse Communication: Mobility status PT Visit Diagnosis: Unsteadiness on feet (R26.81);Muscle weakness (generalized) (M62.81);Dizziness and giddiness (R42)     Time: 5732-2025 PT Time Calculation (min) (ACUTE ONLY): 23 min  Charges:  $Gait Training: 8-22 mins $Therapeutic Activity: 8-22 mins                     Daliya Parchment R. PTA Acute Rehabilitation Services Office: [place] 06/10/2022, 12:36 PM

## 2022-06-10 NOTE — Progress Notes (Addendum)
PROGRESS NOTE    [name]  GUR:[id number] DOB: 28-Feb-1951 DOA: 05/26/2022 PCP: Silverio Decamp, MD   Brief Narrative:   71 year old with history of hypertension, hyperlipidemia, chronic A-fib on Eliquis status post ablation, hypothyroidism GERD and anxiety presented to the ER with low hemoglobin.  Patient recently suffering from multiple abdominal symptoms ongoing nausea vomiting poor appetite and diarrhea since 7/9.   Previous hospitalization 7/9-7/17, admitted to [place] with nausea vomiting and diarrhea, CT scan consistent with right-sided colitis and pneumatosis coli in the proximal hepatic flexure.  Initially treated with IV antibiotics, changed to Augmentin and discharged home where she completed her antibiotic course.  7/24 - (current admission) -noted to have bright red blood per rectum, instructed to discontinue Eliquis in the outpatient setting but with worsening symptoms of fatigue and weakness presented to the ED for symptoms noted to be profoundly anemic requiring transfusion, GI consulted. -Underwent upper GI endoscopy and colonoscopy on 06/02/2022.  Found to have colonic mass and underwent laparoscopic-assisted right colectomy on 06/07/2022.    Assessment & Plan:   Acute symptomatic blood loss anemia due to GI bleed: -Suspected infectious versus ischemic colitis -Hemoglobin 6.6 at intake status post 2 unit PRBC on 05/26/2022, with no further bleeding;  -  Repeat CT abd/pelvis shows minimally worsening colitis and due to persistent symptoms she underwent upper GI endoscopy which shows low-grade of narrowing Schatzki ring, small hiatal hernia.  Colonoscopy shows obstructing large mass found in the hepatic flexure.  Zosyn discontinued.  Biopsies were taken.  General surgery consulted.  CT chest ruled out metastatic disease.  Patient underwent laparoscopic-assisted right colectomy on 06/07/2022 by Dr. Rosendo Gros. 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 -CEA 0.9, no evidence of metastasis on CT chest/abdomen/pelvis -Status post laparoscopic assisted right colectomy by Dr. Rosendo Gros on 8/8 -Postop care by general surgery, continue with full liquid diet -Final surgical pathology with no evidence of malignancy  Paroxysmal atrial fibrillation: -Eliquis has been held initially due to GI bleed, surgery, okay to resume anticoagulation, currently on full dose Lovenox hopefully can be switched switched to Eliquis in 1 to 2 days. -Sent bradycardic, on flecainide and metoprolol, metoprolol has been discontinued she remains bradycardic, will consult cardiology regarding further recommendation concerning low heart rate and flecainide   Essential hypertension:  -Soft blood pressure, metoprolol discontinued due to bradycardia and soft blood pressure, blood pressure improved, bradycardia has improved as well    Hypothyroidism:  -Euthyroid on Synthroid replacement.   Acute hypoxic respiratory failure/atelectasis: Was requiring 2 L of oxygen yesterday, feels better, she is off of oxy all gen and on room air.  Continue with incentive spirometry.  Chronic insomnia/anxiety/depression: -Continue home meds ramelteon, trazodone, Effexor  GERD: Continue PPI  AKI: Resolved  Hypokalemia: Resolved  Rosacea?:  Redness around cheeks comes and goes.  Likely stress-induced.  No treatment indicated.  Bilateral lower extremity edema: Likely chronic.  She is on Lasix 10 mg p.o. twice daily at home which I increased to 20 mg p.o. daily on 06/05/2022.  Hypotension/bradycardia -Received multiple fluid boluses, has been discontinued given some evidence of  volume overload . -Sinew to hold Avapro and metoprolol. -Rate has improved, she  remains on flecainide -Blood pressure has improved as well after starting midodrine.  Urinary retention -Foley catheter inserted yesterday, will attempt voiding trial when she is more ambulatory  DVT prophylaxis: SCD, lovenox Code Status: Full code Family Communication: D/W son by phone Disposition Plan: likely in 2 to 3 days  Consultants:  GI General Surgery Cardiology  Procedures:  EGD Colonoscopy  Antimicrobials:  Zosyn  Status is: Inpatient     Subjective:  Nausea has improved, asking if we can advance her diet, some expected postoperative pain    Objective: Vitals:   06/10/22 0347 06/10/22 0635 06/10/22 0925 06/10/22 1223  BP: (!) 108/56  (!) 141/69 (!) 148/68  Pulse: (!) 53 68 66 65  Resp: '12  17 16  '$ Temp: 97.9 F (36.6 C)  98.6 F (37 C) 97.8 F (36.6 C)  TempSrc: Axillary  Oral Oral  SpO2: 100%  100% 93%  Weight:      Height:        Intake/Output Summary (Last 24 hours) at 06/10/2022 1351 Last data filed at 06/10/2022 1220 Gross per 24 hour  Intake 360 ml  Output 3000 ml  Net -2640 ml   Filed Weights   05/26/22 1043 06/07/22 0739  Weight: 111.2 kg 104.3 kg    Examination:  Awake Alert, Oriented X 3, No new F.N deficits, Normal affect Symmetrical Chest wall movement, Good air movement bilaterally, CTAB IRR  IRR ,No Gallops,Rubs or new Murmurs, No Parasternal Heave +ve B.Sounds, Abd Soft, expected postoperative tenderness, midline wound covered with honeycomb mesh  no Cyanosis, Clubbing, +1 edema.  Data Reviewed: I have personally reviewed following labs and imaging studies  CBC: Recent Labs  Lab 06/04/22 0200 06/08/22 0711 06/08/22 2153 06/09/22 0549 06/10/22 0250  WBC 4.1 7.1 7.4 7.2 5.5  HGB 8.8* 8.7* 7.7* 8.0* 8.9*  HCT 28.7* 27.9* 25.5* 26.1* 28.5*  MCV 100.0 97.6 100.4* 100.8* 96.9  PLT 216 209 182 187 956   Basic Metabolic Panel: Recent  Labs  Lab 06/04/22 0200 06/08/22 0711 06/08/22 2153 06/09/22 0549 06/10/22 0250  NA 140 142 137 137 137  K 3.6 3.4* 3.3* 3.8 3.5  CL 103 101 101 102 104  CO2 '31 31 29 29 28  '$ GLUCOSE 91 116* 128* 106* 107*  BUN 6* 5* '11 11 9  '$ CREATININE 0.82 0.75 1.34* 1.36* 0.90  CALCIUM  she completed her antibiotic course.     7/24 - (current admission) -noted to have bright red blood per rectum, instructed to discontinue Eliquis in the outpatient setting but with worsening symptoms of fatigue and weakness presented to the ED for symptoms noted to be profoundly anemic requiring transfusion, GI consulted. -Underwent upper GI endoscopy and colonoscopy on 06/02/2022.  Found to have colonic mass and underwent laparoscopic-assisted right colectomy on 06/07/2022.    Assessment & Plan:   Acute symptomatic blood loss anemia due to GI bleed: -Suspected infectious versus ischemic colitis -Hemoglobin 6.6 at intake status post 2 unit PRBC on 05/26/2022, with no further bleeding;  -  Repeat CT abd/pelvis shows minimally worsening colitis and due to persistent symptoms she underwent upper GI endoscopy which shows low-grade of narrowing Schatzki ring, small hiatal hernia.  Colonoscopy shows obstructing large mass found in the hepatic flexure.  Zosyn discontinued.  Biopsies were taken.  General surgery consulted.  CT chest ruled out metastatic disease.  Patient underwent laparoscopic-assisted right colectomy on 06/07/2022 by Dr. Rosendo Gros. 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 -CEA 0.9, no evidence of metastasis on CT chest/abdomen/pelvis -Status post laparoscopic assisted right colectomy by Dr. Rosendo Gros on 8/8 -Postop care by general surgery, continue with full liquid diet -Final surgical pathology with no evidence of malignancy  Paroxysmal atrial fibrillation: -Eliquis has been held initially due to GI bleed, surgery, okay to resume anticoagulation, currently on full dose Lovenox hopefully can be switched switched to Eliquis in 1 to 2 days. -Sent bradycardic, on flecainide and metoprolol, metoprolol has been discontinued she remains bradycardic, will consult cardiology regarding further recommendation concerning low heart rate and flecainide   Essential hypertension:  -Soft blood pressure, metoprolol discontinued due to bradycardia and soft blood pressure, blood pressure improved, bradycardia has improved as well    Hypothyroidism:  -Euthyroid on Synthroid replacement.   Acute hypoxic respiratory failure/atelectasis: Was requiring 2 L of oxygen yesterday, feels better, she is off of oxy all gen and on room air.  Continue with incentive spirometry.  Chronic insomnia/anxiety/depression: -Continue home meds ramelteon, trazodone, Effexor  GERD: Continue PPI  AKI: Resolved  Hypokalemia: Resolved  Rosacea?:  Redness around cheeks comes and goes.  Likely stress-induced.  No treatment indicated.  Bilateral lower extremity edema: Likely chronic.  She is on Lasix 10 mg p.o. twice daily at home which I increased to 20 mg p.o. daily on 06/05/2022.  Hypotension/bradycardia -Received multiple fluid boluses, has been discontinued given some evidence of  volume overload . -Sinew to hold Avapro and metoprolol. -Rate has improved, she  remains on flecainide -Blood pressure has improved as well after starting midodrine.  Urinary retention -Foley catheter inserted yesterday, will attempt voiding trial when she is more ambulatory  DVT prophylaxis: SCD, lovenox Code Status: Full code Family Communication: D/W son by phone Disposition Plan: likely in 2 to 3 days  Consultants:  GI General Surgery Cardiology  Procedures:  EGD Colonoscopy  Antimicrobials:  Zosyn  Status is: Inpatient     Subjective:  Nausea has improved, asking if we can advance her diet, some expected postoperative pain    Objective: Vitals:   06/10/22 0347 06/10/22 0635 06/10/22 0925 06/10/22 1223  BP: (!) 108/56  (!) 141/69 (!) 148/68  Pulse: (!) 53 68 66 65  Resp: '12  17 16  '$ Temp: 97.9 F (36.6 C)  98.6 F (37 C) 97.8 F (36.6 C)  TempSrc: Axillary  Oral Oral  SpO2: 100%  100% 93%  Weight:      Height:        Intake/Output Summary (Last 24 hours) at 06/10/2022 1351 Last data filed at 06/10/2022 1220 Gross per 24 hour  Intake 360 ml  Output 3000 ml  Net -2640 ml   Filed Weights   05/26/22 1043 06/07/22 0739  Weight: 111.2 kg 104.3 kg    Examination:  Awake Alert, Oriented X 3, No new F.N deficits, Normal affect Symmetrical Chest wall movement, Good air movement bilaterally, CTAB IRR  IRR ,No Gallops,Rubs or new Murmurs, No Parasternal Heave +ve B.Sounds, Abd Soft, expected postoperative tenderness, midline wound covered with honeycomb mesh  no Cyanosis, Clubbing, +1 edema.  Data Reviewed: I have personally reviewed following labs and imaging studies  CBC: Recent Labs  Lab 06/04/22 0200 06/08/22 0711 06/08/22 2153 06/09/22 0549 06/10/22 0250  WBC 4.1 7.1 7.4 7.2 5.5  HGB 8.8* 8.7* 7.7* 8.0* 8.9*  HCT 28.7* 27.9* 25.5* 26.1* 28.5*  MCV 100.0 97.6 100.4* 100.8* 96.9  PLT 216 209 182 187 956   Basic Metabolic Panel: Recent  Labs  Lab 06/04/22 0200 06/08/22 0711 06/08/22 2153 06/09/22 0549 06/10/22 0250  NA 140 142 137 137 137  K 3.6 3.4* 3.3* 3.8 3.5  CL 103 101 101 102 104  CO2 '31 31 29 29 28  '$ GLUCOSE 91 116* 128* 106* 107*  BUN 6* 5* '11 11 9  '$ CREATININE 0.82 0.75 1.34* 1.36* 0.90  CALCIUM 8.9 8.4* 8.1* 8.2* 8.0*  MG  --   --  1.8 1.8  --   PHOS  --   --   --  3.1  --    GFR: Estimated Creatinine Clearance: 66.3 mL/min (by C-G formula based on SCr of 0.9 mg/dL). Liver Function Tests: Recent Labs  Lab 06/08/22 2153  AST 28  ALT 17  ALKPHOS 70  BILITOT 0.7  PROT 4.5*  ALBUMIN 2.1*    No results for input(s): "LIPASE", "AMYLASE" in the last 168 hours.  No results for input(s): "AMMONIA" in the last 168 hours. Coagulation Profile: No results for input(s): "INR", "PROTIME" in the last 168 hours. Cardiac Enzymes: No results for input(s): "CKTOTAL", "CKMB", "CKMBINDEX", "TROPONINI" in the last 168 hours. BNP (last 3 results) No results for input(s): "PROBNP" in the last 8760 hours. HbA1C: No results for input(s): "HGBA1C" in the last 72 hours. CBG: Recent Labs  Lab 06/08/22 1745  GLUCAP 126*   Lipid Profile: No results for input(s): "CHOL", "HDL", "LDLCALC", "TRIG", "CHOLHDL", "LDLDIRECT" in the last 72 hours. Thyroid Function Tests: No results for input(s): "TSH", "T4TOTAL", "FREET4", "T3FREE", "  THYROIDAB" in the last 72 hours. Anemia Panel: No results for input(s): "VITAMINB12", "FOLATE", "FERRITIN", "TIBC", "IRON", "RETICCTPCT" in the last 72 hours. Sepsis Labs: No results for input(s): "PROCALCITON", "LATICACIDVEN" in the last 168 hours.   Recent Results (from the past 240 hour(s))  Surgical pcr screen     Status: None   Collection Time: 06/07/22  3:45 AM   Specimen: Nasal Mucosa; Nasal Swab  Result Value Ref Range Status   MRSA, PCR NEGATIVE NEGATIVE Final   Staphylococcus aureus NEGATIVE NEGATIVE Final    Comment: (NOTE) The Xpert SA Assay (FDA approved for NASAL  specimens in patients 18 years of age and older), is one component of a comprehensive surveillance program. It is not intended to diagnose infection nor to guide or monitor treatment. Performed at [facility], [place] [address], [place], [place] [address]       Radiology Studies: No results found.  Scheduled Meds:  acetaminophen  1,000 mg Oral Q8H   enoxaparin (LOVENOX) injection  100 mg Subcutaneous BID   feeding supplement  1 Container Oral TID BM   flecainide  50 mg Oral Q12H   furosemide  20 mg Oral BID   gabapentin  1,200 mg Oral QHS   icosapent Ethyl  2 g Oral BID   levothyroxine  75 mcg Oral Q breakfast   metoprolol succinate  50 mg Oral Daily   pantoprazole  40 mg Oral Daily   ramelteon  8 mg Oral QHS   sodium chloride flush  3 mL Intravenous Q12H   traZODone  100 mg Oral QHS   venlafaxine XR  225 mg Oral Daily   Continuous Infusions:   LOS: 15 days    Phillips Climes, MD [facility]  If 7PM-7AM, please contact night-coverage [email or web address] 06/10/2022, 1:51 PM

## 2022-06-10 NOTE — Progress Notes (Signed)
[facility] Progress Note  3 Days Post-Op  Subjective: CC:  NAEO. More alert today. C/o abd pain. +flatus. +BM.   Objective: Vital signs in last 24 hours: Temp:  [97.6 F (36.4 C)-98.6 F (37 C)] 98.6 F (37 C) (08/11 0925) Pulse Rate:  [53-68] 66 (08/11 0925) Resp:  [12-20] 17 (08/11 0925) BP: (103-141)/(45-70) 141/69 (08/11 0925) SpO2:  [95 %-100 %] 100 % (08/11 0925) Last BM Date : 06/09/22  Intake/Output from previous day: 08/10 0701 - 08/11 0700 In: 120 [P.O.:120] Out: 2350 [Urine:2350] Intake/Output this shift: Total I/O In: 240 [P.O.:240] Out: -   PE: Gen:  Alert, NAD, appears uncomfortable Card:  Regular rate and rhythm Pulm:  slightly labored ORA Abd: Soft, nondistended, midline incision c/d/i Skin: warm and dry, no rashes  Psych: A&Ox3   Lab Results:  Recent Labs    06/09/22 0549 06/10/22 0250  WBC 7.2 5.5  HGB 8.0* 8.9*  HCT 26.1* 28.5*  PLT 187 175   BMET Recent Labs    06/09/22 0549 06/10/22 0250  NA 137 137  K 3.8 3.5  CL 102 104  CO2 29 28  GLUCOSE 106* 107*  BUN 11 9  CREATININE 1.36* 0.90  CALCIUM 8.2* 8.0*   PT/INR No results for input(s): "LABPROT", "INR" in the last 72 hours. CMP     Component Value Date/Time   NA 137 06/10/2022 0250   K 3.5 06/10/2022 0250   CL 104 06/10/2022 0250   CO2 28 06/10/2022 0250   GLUCOSE 107 (H) 06/10/2022 0250   BUN 9 06/10/2022 0250   CREATININE 0.90 06/10/2022 0250   CREATININE 0.85 05/25/2022 0000   CALCIUM 8.0 (L) 06/10/2022 0250   PROT 4.5 (L) 06/08/2022 2153   ALBUMIN 2.1 (L) 06/08/2022 2153   AST 28 06/08/2022 2153   ALT 17 06/08/2022 2153   ALKPHOS 70 06/08/2022 2153   BILITOT 0.7 06/08/2022 2153   GFRNONAA >60 06/10/2022 0250   GFRNONAA 64 03/11/2020 1202   GFRAA 74 03/11/2020 1202   Lipase     Component Value Date/Time   LIPASE 25 05/29/2022 1634       Studies/Results: No results found.  Anti-infectives: Anti-infectives (From admission, onward)     Start     Dose/Rate Route Frequency Ordered Stop   06/07/22 2200  cefoTEtan (CEFOTAN) 1 g in sodium chloride 0.9 % 100 mL IVPB        1 g 200 mL/hr over 30 Minutes Intravenous Every 12 hours 06/07/22 1216 06/08/22 1015   06/07/22 0830  cefoTEtan (CEFOTAN) 2 g in sodium chloride 0.9 % 100 mL IVPB        2 g 200 mL/hr over 30 Minutes Intravenous On call to O.R. 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 COLON, TRANSVERSE AND DESCENDING, POLYPECTOMY:  Tubular adenoma.  Negative for high-grade dysplasia.   B. 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  A small fragment of normal colonic mucosa is present.  Please see comment.   - CEA 0.9, no evidence of metastasis on CT  C/A/P   POD#3 S/P LAPAROSCOPIC ASSISTED RIGHT COLECTOMY 06/07/22 Dr. Rosendo Gros - afebrile, VSS, hgb stable  - final surgical path negative for malignancy. I told the patient these results.  - now passing gas, +BM, more alert. Advance to soft diet. - Ok to resume DOAC - PT/OT - stable for discharge from [facility] perspective once tolerating soft diet. PT/OT recommending Home health. Pt states her daughter and son may be able to provide intermittent supervision.  FEN - FLD, ensure VTE - SCDs, lovenox ID - cefotetan  ABL anemia - hgb 6.6 on admission, s/p 2u pRBC 05/26/22. Hgb 8.8> 7.7 > 8.0, stable.  AKI - Cr 0.75 > 1.34 > 1.36 > 0.9 today.  Sinus bracycardia HTN HLD A. Fib s/p ablation on Eliquis at baseline  Hypothyroidism    LOS: 15 days   I reviewed nursing notes, Consultant GI notes,  hospitalist notes, last 24 h vitals and pain scores, last 48 h intake and output, last 24 h labs and trends, and last 24 h imaging results.  Obie Dredge, PA-C [facility] Please see Amion for pager number during day hours 7:00am-4:30pm

## 2022-06-10 NOTE — Consult Note (Addendum)
Cardiology Consultation:   Patient ID: BOBIE CARIS MRN: [id number]; DOB: 11-01-1950  Admit date: 05/26/2022 Date of Consult: 06/10/2022  PCP:  Silverio Decamp, MD   [facility] Providers Cardiologist:  None   [facility]  Patient Profile:   PAULINA MUCHMORE is a 71 y.o. female with a hx of hypertension, hyperlipidemia, atrial fibrillation status post ablation '20, mild tricuspid regurgitation who is being seen 06/10/2022 for the evaluation of bradycardia at the request of Dr. Waldron Labs.  History of Present Illness:   Ms. Orr is a 71 year old female with history of persistent atrial fibrillation status post ablation 03/2019 with Dr. Deno Etienne.  She is followed by [facility] cardiology as an outpatient.  Echocardiogram 08/2020 showed LVEF of 60%, normal RV size and function, mildly dilated left atrium.  She was last seen in her cardiologist office on 10/2021.  Reported she had had some difficulty coping with the loss of her husband and had intermittent chest pain associated with this.  It was recommended that she undergo nuclear stress testing.  She was continued on Eliquis 5 mg twice daily, flecainide 50 mg twice daily, Vascepa, Toprol XL 25 mg daily, Diovan HCTZ 320-25 mg daily.   She was recently admitted 7/9 through 7/17 to [place] with nausea, vomiting and diarrhea.  CT scan was consistent with colitis and new pneumatosis coli in the proximal hepatic flexure.  She was treated with IV antibiotics and discharged on Augmentin.  Presented to the ED on 05/26/2022 after a follow-up visit with her PCP where she had routine lab work done.  States she was called later that evening and instructed that her hemoglobin was down to 6 and advised to present to the ED.  On admission she was noted to be hypotensive with labs significant for hemoglobin of 6.6, potassium 3.1, creatinine 1.18.  She was admitted to internal medicine and transfused 2 units of PRBCs.  She was evaluated by GI and  underwent EGD with low-grade narrowing of Schatzki's ring, small hiatal hernia.  Colonoscopy showed partially obstructing tumor at the hepatic flexure, polyp in the transverse and descending colon.  Seen by general surgery and underwent laparoscopic right colectomy on 8/8 with Dr. Rosendo Gros.  CEA 1.9, no evidence of metastasis on CT. Final surgical pathology was negative for malignancy.  She was cleared to resume her Eliquis by surgery on 8/11.  The evening of 8/9 patient developed hypotension and confusion.  She had recently gotten pain medication.  She was given Narcan along with IV fluid boluses with improvement.  Bladder scan noted no significant urine.  Her home BP medications including Toprol XL 50 mg daily and losartan were held.  She was started on midodrine.  Since then her blood pressure has been stable as well as heart rate noted in the upper 60-70 range.  Her urine output has increased.  She is hopeful for discharge within the next 24 to 48 hours.  Cardiology asked to evaluate regarding her home cardiac medications.  Past Medical History:  Diagnosis Date   Anxiety    Atrial fibrillation ([place])    Depression    GERD (gastroesophageal reflux disease)    Hypertension    Plaque psoriasis    Thyroid disease     Past Surgical History:  Procedure Laterality Date   BIOPSY  06/02/2022   Procedure: BIOPSY;  Surgeon: Daryel November, MD;  Location: [facility] ENDOSCOPY;  Service: Gastroenterology;;   COLONOSCOPY N/A 06/02/2022   Procedure: COLONOSCOPY;  Surgeon: Daryel November, MD;  Location: [facility] ENDOSCOPY;  Service: Gastroenterology;  Laterality: N/A;   ESOPHAGOGASTRODUODENOSCOPY N/A 06/02/2022   Procedure: ESOPHAGOGASTRODUODENOSCOPY (EGD);  Surgeon: Daryel November, MD;  Location: [place];  Service: Gastroenterology;  Laterality: N/A;   LAPAROSCOPIC PARTIAL COLECTOMY N/A 06/07/2022   Procedure: LAPAROSCOPIC PARTIAL COLECTOMY; POSSIBLE OPEN PARTIAL COLECTOMY;  Surgeon: Ralene Ok, MD;   Location: [place];  Service: General;  Laterality: N/A;   ORIF ANKLE FRACTURE     POLYPECTOMY  06/02/2022   Procedure: POLYPECTOMY;  Surgeon: Daryel November, MD;  Location: [facility] ENDOSCOPY;  Service: Gastroenterology;;   SUBMUCOSAL TATTOO INJECTION  06/02/2022   Procedure: SUBMUCOSAL TATTOO INJECTION;  Surgeon: Daryel November, MD;  Location: [facility] ENDOSCOPY;  Service: Gastroenterology;;     Home Medications:  Prior to Admission medications   Medication Sig Start Date End Date Taking? Authorizing Provider  amoxicillin-clavulanate (AUGMENTIN) 875-125 MG tablet Take 1 tablet by mouth 2 (two) times daily. 05/20/22  Yes [provider]  aspirin EC 81 MG tablet Take 1 tablet (81 mg total) by mouth daily. 05/25/22  Yes Silverio Decamp, MD  flecainide (TAMBOCOR) 50 MG tablet Take 50 mg by mouth 2 (two) times a day. 03/20/19  Yes [provider]  furosemide (LASIX) 20 MG tablet Take 0.5 tablets (10 mg total) by mouth daily. 07/23/21  Yes Silverio Decamp, MD  gabapentin (NEURONTIN) 600 MG tablet Take 2 tablets (1,200 mg total) by mouth at bedtime. 01/24/22  Yes Silverio Decamp, MD  icosapent Ethyl (VASCEPA) 1 g capsule Take 2 capsules (2 g total) by mouth 2 (two) times daily. 03/09/21  Yes Silverio Decamp, MD  levothyroxine (SYNTHROID) 75 MCG tablet Take 1 tablet (75 mcg total) by mouth daily with breakfast. 01/24/22  Yes Silverio Decamp, MD  metoprolol succinate (TOPROL-XL) 50 MG 24 hr tablet TAKE ONE TABLET BY MOUTH DAILY Patient taking differently: Take 50 mg by mouth daily. 03/01/22  Yes Silverio Decamp, MD  Multiple Vitamins-Minerals (MULTIVITAMIN WITH MINERALS) tablet Take 1 tablet by mouth daily.   Yes [provider]  ondansetron (ZOFRAN-ODT) 8 MG disintegrating tablet Take 1 tablet (8 mg total) by mouth every 8 (eight) hours as needed for nausea. 05/25/22  Yes Silverio Decamp, MD  oxyCODONE-acetaminophen (PERCOCET/ROXICET) 5-325 MG  tablet Take 1 tablet by mouth every 8 (eight) hours as needed. 05/25/22  Yes Silverio Decamp, MD  pantoprazole (PROTONIX) 40 MG tablet TAKE ONE TABLET BY MOUTH DAILY Patient taking differently: Take 40 mg by mouth daily. 04/28/22  Yes Silverio Decamp, MD  ramelteon (ROZEREM) 8 MG tablet 1 tablet p.o. at [facility] 01/24/22  Yes Silverio Decamp, MD  traZODone (DESYREL) 100 MG tablet Take 1 tablet (100 mg total) by mouth at bedtime. 07/08/21  Yes Silverio Decamp, MD  valsartan-hydrochlorothiazide (DIOVAN-HCT) 320-25 MG tablet Take 0.5 tablets by mouth daily. 05/25/22  Yes Silverio Decamp, MD  venlafaxine XR (EFFEXOR-XR) 75 MG 24 hr capsule Take 3 capsules (225 mg total) by mouth daily. 05/23/22  Yes Silverio Decamp, MD  AMBULATORY NON FORMULARY MEDICATION Medication Name: Rollator 06/08/21   Silverio Decamp, MD  AMBULATORY NON [place] scooter for use daily 11/12/21   Silverio Decamp, MD  AMBULATORY NON FORMULARY MEDICATION Shower seat for use daily 05/16/22   Silverio Decamp, MD    Inpatient Medications: Scheduled Meds:  acetaminophen  1,000 mg Oral Q8H   enoxaparin (LOVENOX) injection  100 mg Subcutaneous BID   feeding supplement  1 Container Oral TID BM   flecainide  50 mg Oral Q12H   furosemide  20 mg Oral BID   gabapentin  1,200 mg Oral QHS   icosapent Ethyl  2 g Oral BID   levothyroxine  75 mcg Oral Q breakfast   metoprolol succinate  50 mg Oral Daily   pantoprazole  40 mg Oral Daily   ramelteon  8 mg Oral QHS   sodium chloride flush  3 mL Intravenous Q12H   traZODone  100 mg Oral QHS   venlafaxine XR  225 mg Oral Daily   Continuous Infusions:  sodium chloride 10 mL/hr at 06/07/22 2257   lactated ringers 10 mL/hr at 06/07/22 0801   PRN Meds: sodium chloride, albuterol, butalbital-acetaminophen-caffeine, hyoscyamine, morphine injection, naLOXone (NARCAN)  injection, ondansetron **OR** ondansetron (ZOFRAN) IV,  oxyCODONE, polyethylene glycol  Allergies:   No Known Allergies  Social History:   Social History   Socioeconomic History   Marital status: Widowed    Spouse name: Not on file   Number of children: Not on file   Years of education: Not on file   Highest education level: Not on file  Occupational History   Not on file  Tobacco Use   Smoking status: Never   Smokeless tobacco: Never  Vaping Use   Vaping Use: Never used  Substance and Sexual Activity   Alcohol use: Never   Drug use: Never   Sexual activity: Not on file  Other Topics Concern   Not on file  Social History Narrative   Not on file   Social Determinants of Health   Financial Resource Strain: Low Risk  (07/07/2021)   Overall Financial Resource Strain (CARDIA)    Difficulty of Paying Living Expenses: Not hard at all  Food Insecurity: No Food Insecurity (07/07/2021)   Hunger Vital Sign    Worried About Running Out of Food in the Last Year: Never true    [place] in the Last Year: Never true  Transportation Needs: No Transportation Needs (07/07/2021)   PRAPARE - Hydrologist (Medical): No    Lack of Transportation (Non-Medical): No  Physical Activity: Inactive (07/07/2021)   Exercise Vital Sign    Days of Exercise per Week: 0 days    Minutes of Exercise per Session: 0 min  Stress: No Stress Concern Present (07/07/2021)   [place]    Feeling of Stress : Only a little  Social Connections: Moderately Isolated (07/07/2021)   Social Connection and Isolation Panel [NHANES]    Frequency of Communication with Friends and Family: More than three times a week    Frequency of Social Gatherings with Friends and Family: Once a week    Attends Religious Services: More than 4 times per year    Active Member of [employer] or Organizations: No    Attends Archivist Meetings: Never    Marital Status: Widowed  Intimate Partner  Violence: Not At Risk (07/07/2021)   Humiliation, Afraid, Rape, and Kick questionnaire    Fear of Current or Ex-Partner: No    Emotionally Abused: No    Physically Abused: No    Sexually Abused: No    Family History:    Family History  Problem Relation Age of Onset   Hypertension Mother    Hypertension Father    Cancer Father        colon   Cancer Daughter  daughter   Cancer Son        skin   Cancer Maternal Aunt        breast   Hypertension Maternal Grandmother    Hypertension Maternal Grandfather    Heart attack Maternal Grandfather    Hypertension Paternal Grandmother    Stroke Paternal Grandfather    Cancer Maternal Aunt        breast     ROS:  Please see the history of present illness.   All other ROS reviewed and negative.     Physical Exam/Data:   Vitals:   06/10/22 0347 06/10/22 0635 06/10/22 0925 06/10/22 1223  BP: (!) 108/56  (!) 141/69 (!) 148/68  Pulse: (!) 53 68 66 65  Resp: '12  17 16  '$ Temp: 97.9 F (36.6 C)  98.6 F (37 C) 97.8 F (36.6 C)  TempSrc: Axillary  Oral Oral  SpO2: 100%  100% 93%  Weight:      Height:        Intake/Output Summary (Last 24 hours) at 06/10/2022 1349 Last data filed at 06/10/2022 1220 Gross per 24 hour  Intake 360 ml  Output 3000 ml  Net -2640 ml      06/07/2022    7:39 AM 05/26/2022   10:43 AM 05/25/2022    3:14 PM  Last 3 Weights  Weight (lbs) 230 lb 245 lb 2.4 oz 243 lb  Weight (kg) 104.327 kg 111.2 kg 110.224 kg     Body mass index is 40.74 kg/m.  General: Obese older female, sitting up in chair comfortably. HEENT: normal Neck: no JVD Vascular: No carotid bruits; Distal pulses 2+ bilaterally Cardiac:  normal S1, S2; RRR; no murmur  Lungs:  clear to auscultation bilaterally, no wheezing, rhonchi or rales  Abd: soft, nontender, no hepatomegaly  Ext: no edema Musculoskeletal:  No deformities, BUE and BLE strength normal and equal Skin: warm and dry  Neuro:  CNs 2-12 intact, no focal abnormalities  noted Psych:  Normal affect   EKG:  The EKG was personally reviewed and demonstrates: Sinus rhythm, 60 bpm Telemetry:  Telemetry was personally reviewed and demonstrates: Sinus rhythm, 60 to 70 bpm  Relevant CV Studies:  Echo: 06/04/22  IMPRESSIONS     1. Left ventricular ejection fraction, by estimation, is 60 to 65%. The  left ventricle has normal function. The left ventricle has no regional  wall motion abnormalities. There is mild left ventricular hypertrophy.  Left ventricular diastolic parameters  were normal. There is the interventricular septum is flattened in diastole  ('D' shaped left ventricle), consistent with right ventricular volume  overload.   2. Right ventricular systolic function is normal. The right ventricular  size is moderately enlarged. There is mildly elevated pulmonary artery  systolic pressure. The estimated right ventricular systolic pressure is  27.7 mmHg.   3. The mitral valve is normal in structure. Mild mitral valve  regurgitation. No evidence of mitral stenosis.   4. Tricuspid valve regurgitation is moderate.   5. The aortic valve is grossly normal. There is mild calcification of the  aortic valve. Aortic valve regurgitation is not visualized. No aortic  stenosis is present.   6. The inferior vena cava is dilated in size with <50% respiratory  variability, suggesting right atrial pressure of 15 mmHg.   FINDINGS   Left Ventricle: Left ventricular ejection fraction, by estimation, is 60  to 65%. The left ventricle has normal function. The left ventricle has no  regional wall motion abnormalities. Definity  contrast agent was given IV  to delineate the left ventricular   endocardial borders. The left ventricular internal cavity size was normal  in size. There is mild left ventricular hypertrophy. The interventricular  septum is flattened in diastole ('D' shaped left ventricle), consistent  with right ventricular volume  overload. Left ventricular  diastolic parameters were normal.   Right Ventricle: The right ventricular size is moderately enlarged. No  increase in right ventricular wall thickness. Right ventricular systolic  function is normal. There is mildly elevated pulmonary artery systolic  pressure. The tricuspid regurgitant  velocity is 2.72 m/s, and with an assumed right atrial pressure of 15  mmHg, the estimated right ventricular systolic pressure is 99.3 mmHg.   Left Atrium: Left atrial size was normal in size.   Right Atrium: Right atrial size was normal in size.   Pericardium: Trivial pericardial effusion is present.   Mitral Valve: The mitral valve is normal in structure. Mild mitral valve  regurgitation. No evidence of mitral valve stenosis.   Tricuspid Valve: The tricuspid valve is normal in structure. Tricuspid  valve regurgitation is moderate . No evidence of tricuspid stenosis.   Aortic Valve: The aortic valve is grossly normal. There is mild  calcification of the aortic valve. Aortic valve regurgitation is not  visualized. No aortic stenosis is present. Aortic valve mean gradient  measures 4.0 mmHg. Aortic valve peak gradient  measures 8.8 mmHg. Aortic valve area, by VTI measures 2.10 cm.   Pulmonic Valve: The pulmonic valve was normal in structure. Pulmonic valve  regurgitation is trivial. No evidence of pulmonic stenosis.   Aorta: The aortic root is normal in size and structure.   Venous: The inferior vena cava is dilated in size with less than 50%  respiratory variability, suggesting right atrial pressure of 15 mmHg.   IAS/Shunts: The interatrial septum was not well visualized.   Laboratory Data:  High Sensitivity Troponin:  No results for input(s): "TROPONINIHS" in the last 720 hours.   Chemistry Recent Labs  Lab 06/08/22 2153 06/09/22 0549 06/10/22 0250  NA 137 137 137  K 3.3* 3.8 3.5  CL 101 102 104  CO2 '29 29 28  '$ GLUCOSE 128* 106* 107*  BUN '11 11 9  '$ CREATININE 1.34* 1.36* 0.90   CALCIUM 8.1* 8.2* 8.0*  MG 1.8 1.8  --   GFRNONAA 42* 42* >60  ANIONGAP '7 6 5    '$ Recent Labs  Lab 06/08/22 2153  PROT 4.5*  ALBUMIN 2.1*  AST 28  ALT 17  ALKPHOS 70  BILITOT 0.7   Lipids No results for input(s): "CHOL", "TRIG", "HDL", "LABVLDL", "LDLCALC", "CHOLHDL" in the last 168 hours.  Hematology Recent Labs  Lab 06/08/22 2153 06/09/22 0549 06/10/22 0250  WBC 7.4 7.2 5.5  RBC 2.54* 2.59* 2.94*  HGB 7.7* 8.0* 8.9*  HCT 25.5* 26.1* 28.5*  MCV 100.4* 100.8* 96.9  MCH 30.3 30.9 30.3  MCHC 30.2 30.7 31.2  RDW 17.7* 17.7* 18.7*  PLT 182 187 175   Thyroid No results for input(s): "TSH", "FREET4" in the last 168 hours.  BNPNo results for input(s): "BNP", "PROBNP" in the last 168 hours.  DDimer No results for input(s): "DDIMER" in the last 168 hours.   Radiology/Studies:  No results found.   Assessment and Plan:   RHONNA HOLSTER is a 71 y.o. female with a hx of hypertension, hyperlipidemia, atrial fibrillation status post ablation '20, mild tricuspid regurgitation who is being seen 06/10/2022 for the evaluation of bradycardia at the  request of Dr. Waldron Labs.  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 intake.  Responded to IV fluids.  Blood pressure medicines were appropriately held at that time.  Since then blood pressures have been stable. -- stop midodrine, resume Toprol XL home dose -- continue PO lasix   Paroxysmal atrial fibrillation Chronic anticoagulation S/p A-fib ablation -- Reports she has done well since her ablation in 2020. -- Continue flecainide, Eliquis to be resumed per surgery discretion today -- Would DC midodrine and resume beta-blocker   Acute loss anemia secondary to GI bleed: Hemoglobin 6.6 on admission status post 2 units PRBCs -- s/p right colectomy --  Hemoglobin stable at 8.9 today, DOAC resumed per surgery discretion  Per Primary Hypothyroidism GERD Anxiety Depression   Risk Assessment/Risk Scores:    CHA2DS2-VASc Score = 3  This indicates a 3.2% annual risk of stroke. The patient's score is based upon: CHF History: 0 HTN History: 1 Diabetes History: 0 Stroke History: 0 Vascular Disease History: 0 Age Score: 1 Gender Score: 1    For questions or updates, please contact [place] Please consult [email or web address] for contact info under    Signed, Reino Bellis, NP  06/10/2022 1:49 PM As above, patient seen and examined.  Briefly she is a 71 year old female with past medical history of atrial fibrillation status post ablation at [facility], hypertension, now status post laparoscopic right colectomy on August 8 for partially obstructing tumor at the hepatic flexure, polyp in the transverse and descending colon for evaluation of paroxysmal atrial fibrillation, bradycardia and hypotension.  Following surgery on August 9 patient had hypotension and also was treated with Narcan following pain medication administration.  Blood pressure medications were held and she was placed on midodrine.  She was also felt to be bradycardic and cardiology asked to evaluate.  Patient denies dyspnea, chest pain or syncope.  She has noticed increasing bilateral lower extremity edema.  She complains of abdominal pain from recent surgery.  Blood pressure at present is 148/68 with pulse of 67.  She is mildly volume overloaded on examination. Creatinine 0.90, hemoglobin 8.9.  Electrocardiogram shows normal sinus rhythm with no ST changes.  1 paroxysmal atrial fibrillation-patient remains in sinus rhythm.  Continue flecainide.  Resume Toprol 50 mg daily.  Resume apixaban at preadmission dose when okay with surgery.  2 hypotension-resolved and likely secondary to anesthesia and pain medications.  Would resume Toprol and follow blood pressure.  Can resume  valsartan HCT at preadmission dose if blood pressure continues to increase.  Would discontinue midodrine.  3 postoperative volume excess-agree with low-dose diuretic and follow potassium/renal function.  4 status post resection of partially obstructing colonic mass-Per surgery.  Kirk Ruths, MD

## 2022-06-10 NOTE — Plan of Care (Signed)

## 2022-06-11 DIAGNOSIS — I480 Paroxysmal atrial fibrillation: Secondary | ICD-10-CM | POA: Diagnosis not present

## 2022-06-11 DIAGNOSIS — D649 Anemia, unspecified: Secondary | ICD-10-CM | POA: Diagnosis not present

## 2022-06-11 DIAGNOSIS — D62 Acute posthemorrhagic anemia: Secondary | ICD-10-CM | POA: Diagnosis not present

## 2022-06-11 DIAGNOSIS — I482 Chronic atrial fibrillation, unspecified: Secondary | ICD-10-CM | POA: Diagnosis not present

## 2022-06-11 LAB — BASIC METABOLIC PANEL
Anion gap: 5 (ref 5–15)
BUN: 6 mg/dL — ABNORMAL LOW (ref 8–23)
CO2: 34 mmol/L — ABNORMAL HIGH (ref 22–32)
Calcium: 8.2 mg/dL — ABNORMAL LOW (ref 8.9–10.3)
Chloride: 104 mmol/L (ref 98–111)
Creatinine, Ser: 0.76 mg/dL (ref 0.44–1.00)
GFR, Estimated: >60 mL/min (ref >60)
Glucose, Bld: 101 mg/dL — ABNORMAL HIGH (ref 70–99)
Potassium: 3.7 mmol/L (ref 3.5–5.1)
Sodium: 143 mmol/L (ref 135–145)

## 2022-06-11 LAB — CBC
HCT: 28.7 % — ABNORMAL LOW (ref 36.0–46.0)
Hemoglobin: 9 g/dL — ABNORMAL LOW (ref 12.0–15.0)
MCH: 30.5 pg (ref 26.0–34.0)
MCHC: 31.4 g/dL (ref 30.0–36.0)
MCV: 97.3 fL (ref 80.0–100.0)
Platelets: 193 10*3/uL (ref 150–400)
RBC: 2.95 MIL/uL — ABNORMAL LOW (ref 3.87–5.11)
RDW: 18.1 % — ABNORMAL HIGH (ref 11.5–15.5)
WBC: 4.3 10*3/uL (ref 4.0–10.5)
nRBC: 0 % (ref 0.0–0.2)

## 2022-06-11 MED ORDER — APIXABAN 5 MG PO TABS
5.0000 mg | ORAL_TABLET | Freq: Two times a day (BID) | ORAL | Status: DC
  Administered 2022-06-11 – 2022-06-13 (×4): 5 mg via ORAL
  Filled 2022-06-11 (×5): qty 1

## 2022-06-11 MED ORDER — IRBESARTAN 300 MG PO TABS
300.0000 mg | ORAL_TABLET | Freq: Every day | ORAL | Status: DC
  Administered 2022-06-11 – 2022-06-13 (×3): 300 mg via ORAL
  Filled 2022-06-11 (×3): qty 1

## 2022-06-11 NOTE — Progress Notes (Signed)
PROGRESS NOTE    [name]  ZOX:[id number] DOB: July 25, 1951 DOA: 05/26/2022 PCP: Silverio Decamp, MD   Brief Narrative:   71 year old with history of hypertension, hyperlipidemia, chronic A-fib on Eliquis status post ablation, hypothyroidism GERD and anxiety presented to the ER with low hemoglobin.  Patient recently suffering from multiple abdominal symptoms ongoing nausea vomiting poor appetite and diarrhea since 7/9.   Previous hospitalization 7/9-7/17, admitted to [facility] with nausea vomiting and diarrhea, CT scan consistent with right-sided colitis and pneumatosis coli in the proximal hepatic flexure.  Initially treated with IV antibiotics, changed to Augmentin and discharged home where she completed her antibiotic course.     7/24 - (current admission) -noted to have bright red blood per rectum, instructed to discontinue Eliquis in the outpatient setting but with worsening symptoms of fatigue and weakness presented to the ED for symptoms noted to be profoundly anemic requiring transfusion, GI consulted. -Underwent upper GI endoscopy and colonoscopy on 06/02/2022.  Found to have colonic mass and underwent laparoscopic-assisted right colectomy on 06/07/2022.    Assessment & Plan:   Acute symptomatic blood loss anemia due to GI bleed: -Suspected infectious versus ischemic colitis -Hemoglobin 6.6 at intake status post 2 unit PRBC on 05/26/2022, with no further bleeding;  -  Repeat CT abd/pelvis shows minimally worsening colitis and due to persistent symptoms she underwent upper GI endoscopy which shows low-grade of narrowing Schatzki ring, small hiatal hernia.  Colonoscopy shows obstructing large mass found in the hepatic flexure.  Zosyn discontinued.  Biopsies were taken.  General surgery consulted.  CT chest ruled out metastatic disease.  Patient underwent laparoscopic-assisted right colectomy on 06/07/2022 by Dr. Rosendo Gros. 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 -CEA 0.9, no evidence of metastasis on CT chest/abdomen/pelvis -Status post laparoscopic assisted right colectomy by Dr. Rosendo Gros on 8/8 -Postop care by general surgery, continue with full liquid diet -Final surgical pathology with no evidence of malignancy  Paroxysmal atrial fibrillation: -On Eliquis for anticoagulation . -Cardiology input greatly appreciated, continue with flecainide, back on metoprolol .  Essential hypertension:  -Continue to monitor    Hypothyroidism:  -Euthyroid on Synthroid replacement.   Acute hypoxic respiratory failure/atelectasis: -Try to wean off oxygen today, she was encouraged to use incentive spirometer, staff did sit her on the chair already and they will try to ambulate later today.    Chronic insomnia/anxiety/depression: -Continue home meds ramelteon, trazodone, Effexor  GERD: Continue PPI  AKI: Resolved  Hypokalemia: Resolved  Rosacea?:  Redness around cheeks comes and goes.  Likely stress-induced.  No treatment indicated.  Bilateral lower extremity edema: Likely chronic.  She is on Lasix 10 mg p.o. twice daily at home which I increased to 20 mg p.o. daily on 06/05/2022.  Hypotension/bradycardia -Improving, continue to monitor on telemetry now she is back on metoprolol, she remains on flecainide, midodrine has been discontinued.  Urinary retention -Will attempt voiding trial today  DVT prophylaxis: back on Eliquis Code Status: Full code Family Communication: D/W son by phone 8/11 Disposition Plan: likely in 2 to 3 days  Consultants:  GI General Surgery Cardiology  Procedures:  EGD Colonoscopy  Antimicrobials:  Zosyn  Status is: Inpatient     Subjective:  No significant  events overnight, she had headache  yesterday, no headache today   Objective: Vitals:   06/11/22 0008 06/11/22 0326 06/11/22 0756 06/11/22 0900  BP: (!) 150/81 139/77 (!) 143/71 (!) 155/76  Pulse: 69 64 63 65  Resp: '15 14 16 18  '$ Temp: 98.6 F (37 C) 98.3 F (36.8 C) 98.1 F (36.7 C)   TempSrc: Oral Oral Oral   SpO2: 95% 93% 95% 92%  Weight:      Height:        Intake/Output Summary (Last 24 hours) at 06/11/2022 1137 Last data filed at 06/11/2022 0326 Gross per 24 hour  Intake 440 ml  Output 2000 ml  Net -1560 ml   Filed Weights   05/26/22 1043 06/07/22 0739  Weight: 111.2 kg 104.3 kg    Examination:  Awake Alert, Oriented X 3, No new F.N deficits, Normal affect, frail Symmetrical Chest wall movement, Good air movement bilaterally, CTAB Irregular,No Gallops,Rubs or new Murmurs, No Parasternal Heave +ve B.Sounds, Abd Soft, minimal surgical wound bandaged No Cyanosis, Clubbing or edema, No new Rash or bruise     Data Reviewed: I have personally reviewed following labs and imaging studies  CBC: Recent Labs  Lab 06/08/22 0711 06/08/22 2153 06/09/22 0549 06/10/22 0250 06/11/22 0215  WBC 7.1 7.4 7.2 5.5 4.3  HGB 8.7* 7.7* 8.0* 8.9* 9.0*  HCT 27.9* 25.5* 26.1* 28.5* 28.7*  MCV 97.6 100.4* 100.8* 96.9 97.3  PLT 209 182 187 175 542   Basic Metabolic Panel: Recent Labs  Lab 06/08/22 0711 06/08/22 2153 06/09/22 0549 06/10/22 0250 06/11/22 0215  NA 142 137 137 137 143  K 3.4* 3.3* 3.8 3.5 3.7  CL 101 101 102 104 104  CO2 '31 29 29 28 '$ 34*  GLUCOSE 116* 128* 106* 107* 101*  BUN 5* '11 11 9 '$ 6*  CREATININE 0.75 1.34* 1.36* 0.90 0.76  CALCIUM 8.4* 8.1* 8.2* 8.0* 8.2*  MG  --  1.8 1.8  --   --   PHOS  --   --  3.1  --   --    GFR: Estimated Creatinine Clearance: 74.5 mL/min (by C-G formula based on SCr of 0.76 mg/dL). Liver Function Tests: Recent Labs  Lab 06/08/22 2153  AST 28  ALT 17  ALKPHOS 70  BILITOT 0.7  PROT 4.5*  ALBUMIN 2.1*    No results for  input(s): "LIPASE", "AMYLASE" in the last 168 hours.  No results for input(s): "AMMONIA" in the last 168 hours. Coagulation Profile: No results for input(s): "INR", "PROTIME" in the last 168 hours. Cardiac Enzymes: No results for input(s): "CKTOTAL", "CKMB", "CKMBINDEX", "TROPONINI" in the last 168 hours. BNP (last 3 results) No results for input(s): "PROBNP" in the last 8760 hours. HbA1C: No results for input(s): "HGBA1C" in the last 72 hours. CBG: Recent Labs  Lab 06/08/22 1745  GLUCAP 126*   Lipid Profile: No results for input(s): "CHOL", "HDL", "LDLCALC", "TRIG", "CHOLHDL", "LDLDIRECT" in the last 72 hours. Thyroid Function Tests: No results for input(s): "TSH", "T4TOTAL", "FREET4", "T3FREE", "THYROIDAB" in the last 72 hours. Anemia Panel: No results for input(s): "VITAMINB12", "FOLATE", "FERRITIN", "TIBC", "IRON", "RETICCTPCT" in the last 72 hours. Sepsis Labs: No results for input(s): "PROCALCITON", "LATICACIDVEN" in the last 168 hours.   Recent Results (from the past 240 hour(s))  Surgical pcr screen     Status: None   Collection Time: 06/07/22  3:45 AM   Specimen: Nasal Mucosa; Nasal Swab  Result Value Ref Range Status   MRSA, PCR NEGATIVE NEGATIVE Final   Staphylococcus aureus NEGATIVE NEGATIVE  Final    Comment: (NOTE) The Xpert SA Assay (FDA approved for NASAL specimens in patients 73 years of age and older), is one component of a comprehensive surveillance program. It is not intended to diagnose infection nor to guide or monitor treatment. Performed at [facility], [place] [address] [place], [place] [address]       Radiology Studies: CT HEAD WO CONTRAST (5MM)  Result Date: 06/10/2022 CLINICAL DATA:  Headache, tension type headache. EXAM: CT HEAD WITHOUT CONTRAST TECHNIQUE: Contiguous axial images were obtained from the base of the skull through the vertex without intravenous contrast. RADIATION DOSE REDUCTION: This exam was performed according to the  departmental dose-optimization program which includes automated exposure control, adjustment of the mA and/or kV according to patient size and/or use of iterative reconstruction technique. COMPARISON:  CT head without contrast 04/07/2020 FINDINGS: Brain: No acute infarct, hemorrhage, or mass lesion is present. Heterogeneous hypoattenuation in the thalami are stable. Basal ganglia are intact. Insular ribbon is normal. Scattered white matter hypoattenuation in the corona radiata demonstrates slight progression. No focal cortical abnormalities are present. The ventricles are of normal size. No significant extraaxial fluid collection is present. The brainstem and cerebellum are within normal limits. Vascular: Atherosclerotic calcifications are present in the cavernous internal carotid arteries bilaterally. No hyperdense vessel present. Skull: Calvarium is intact. No focal lytic or blastic lesions are present. No significant extracranial soft tissue lesion is present. Sinuses/Orbits: The paranasal sinuses and mastoid air cells are clear. Right lens replacement noted. Globes and orbits are otherwise within normal limits. IMPRESSION: 1. No acute intracranial abnormality or significant interval change. 2. Heterogeneous hypoattenuation in the thalami bilaterally is stable. This represents remote ischemic insults. 3. Slight progression of white matter disease. This likely reflects the sequela of chronic microvascular ischemia. Electronically Signed   By: San Morelle M.D.   On: 06/10/2022 19:54    Scheduled Meds:  acetaminophen  1,000 mg Oral Q8H   apixaban  5 mg Oral BID   Chlorhexidine Gluconate Cloth  6 each Topical Q0600   feeding supplement  1 Container Oral TID BM   flecainide  50 mg Oral Q12H   furosemide  20 mg Oral BID   gabapentin  1,200 mg Oral QHS   icosapent Ethyl  2 g Oral BID   levothyroxine  75 mcg Oral Q breakfast   metoprolol succinate  50 mg Oral Daily   pantoprazole  40 mg Oral Daily    ramelteon  8 mg Oral QHS   sodium chloride flush  3 mL Intravenous Q12H   traZODone  100 mg Oral QHS   venlafaxine XR  225 mg Oral Daily   Continuous Infusions:   LOS: 16 days    Phillips Climes, MD [facility]  If 7PM-7AM, please contact night-coverage [email or web address] 06/11/2022, 11:37 AM

## 2022-06-11 NOTE — Progress Notes (Signed)
Occupational Therapy Treatment Patient Details Name: [name] MRN: [id number] DOB: 13-Dec-1950 Today's Date: 06/11/2022   History of present illness 71 yo female with onset of abd pain, N/V/D. and rectal bleeding was admitted on 7/27,Status post laparoscopic assisted right colectomy by Dr. Rosendo Gros on 8/8.  PMHx:  hypertension, hyperlipidemia, chronic A-fib on Eliquis status post ablation, hypothyroidism GERD and anxiety   OT comments  Pt. Seen for skilled OT treatment session. Able to complete bed mobility with s.  Stand pivot to bsc, min a.  Total a for pericare but reports she has a/e at home to assist.  Stand pivot to recliner min guard a.  Remains motivated and pleasant during session.  Reports good family support.  Agree with current d/c recommendations.    Recommendations for follow up therapy are one component of a multi-disciplinary discharge planning process, led by the attending physician.  Recommendations may be updated based on patient status, additional functional criteria and insurance authorization.    Follow Up Recommendations  Home health OT    Assistance Recommended at Discharge Intermittent Supervision/Assistance  Patient can return home with the following  A little help with walking and/or transfers;A little help with bathing/dressing/bathroom;Assistance with cooking/housework;Direct supervision/assist for medications management;Direct supervision/assist for financial management;Assist for transportation;Help with stairs or ramp for entrance   Equipment Recommendations  BSC/3in1    Recommendations for Other Services      Precautions / Restrictions Precautions Precautions: Fall;Other (comment) Precaution Comments: watch O2, abdominal for comfort       Mobility Bed Mobility Overal bed mobility: Needs Assistance Bed Mobility: Supine to Sit     Supine to sit: Supervision     General bed mobility comments: increased time and use of rail, cues for log roll  technique for comfrot- no physical assistance required    Transfers Overall transfer level: Needs assistance Equipment used: None Transfers: Sit to/from Stand, Bed to chair/wheelchair/BSC Sit to Stand: Min assist Stand pivot transfers: Min assist         General transfer comment: min a from eob to 3n1 secondary to partial lob to R. 3n1 to recliner better carry over with pivot and hand placement no lob and more min guard a for transfer     Balance                                           ADL either performed or assessed with clinical judgement   ADL Overall ADL's : Needs assistance/impaired                         Toilet Transfer: Min guard;BSC/3in1;Stand-pivot;Cueing for sequencing;Cueing for safety Toilet Transfer Details (indicate cue type and reason): lob x1 while stand/sit had L hand on arm rest of 3n1 but not R and had a partial sideways lob while transitioning into sitting. i was able to guide her safely into sitting rn was also present in the room Toileting- Clothing Manipulation and Hygiene: Sit to/from stand;Total assistance Toileting - Clothing Manipulation Details (indicate cue type and reason): Wants her toilet aide from home to assist.            Extremity/Trunk Assessment              Vision       Perception     Praxis      Cognition Arousal/Alertness: Awake/alert Behavior  During Therapy: WFL for tasks assessed/performed Overall Cognitive Status: Within Functional Limits for tasks assessed                                          Exercises      Shoulder Instructions       General Comments      Pertinent Vitals/ Pain       Pain Assessment Pain Assessment: No/denies pain  Home Living                                          Prior Functioning/Environment              Frequency  Min 2X/week        Progress Toward Goals  OT Goals(current goals can now be found  in the care plan section)  Progress towards OT goals: Progressing toward goals     Plan Discharge plan remains appropriate    Co-evaluation                 AM-PAC OT "6 Clicks" Daily Activity     Outcome Measure   Help from another person eating meals?: None Help from another person taking care of personal grooming?: A Little Help from another person toileting, which includes using toliet, bedpan, or urinal?: A Lot Help from another person bathing (including washing, rinsing, drying)?: A Lot Help from another person to put on and taking off regular upper body clothing?: None Help from another person to put on and taking off regular lower body clothing?: A Lot 6 Click Score: 17    End of Session Equipment Utilized During Treatment: Gait belt  OT Visit Diagnosis: Unsteadiness on feet (R26.81);Pain   Activity Tolerance Patient tolerated treatment well   Patient Left in chair;with call bell/phone within reach   Nurse Communication          Time: 8882-8003 OT Time Calculation (min): 22 min  Charges: OT General Charges $OT Visit: 1 Visit OT Treatments $Self Care/Home Management : 8-22 mins  Sonia Baller, COTA/L Acute Rehabilitation [phone]   Tanya Nones 06/11/2022, 11:29 AM

## 2022-06-11 NOTE — Progress Notes (Signed)
ANTICOAGULATION CONSULT NOTE - Initial Consult  Pharmacy Consult for Lovenox>>>Apixaban  Indication: atrial fibrillation   No Known Allergies  Patient Measurements: Height: '5\' 3"'$  (160 cm) Weight: 104.3 kg (230 lb) IBW/kg (Calculated) : 52.4  Vital Signs: Temp: 98.3 F (36.8 C) (08/12 0326) Temp Source: Oral (08/12 0326) BP: 139/77 (08/12 0326) Pulse Rate: 64 (08/12 0326)  Labs: Recent Labs    06/09/22 0549 06/10/22 0250 06/11/22 0215  HGB 8.0* 8.9* 9.0*  HCT 26.1* 28.5* 28.7*  PLT 187 175 193  CREATININE 1.36* 0.90 0.76    Estimated Creatinine Clearance: 74.5 mL/min (by C-G formula based on SCr of 0.76 mg/dL).   Medical History: Past Medical History:  Diagnosis Date   Anxiety    Atrial fibrillation ([place])    Depression    GERD (gastroesophageal reflux disease)    Hypertension    Plaque psoriasis    Thyroid disease      Assessment: Cleared by surgery to resume Apixaban, Hgb on the low side but stable  Goal of Therapy:  Monitor platelets by anticoagulation protocol: Yes   Plan:  DC Lovenox  Re-start Apixaban 5 mg BID Daily CBC Watch closely for bleeding  Narda Bonds, PharmD, BCPS Clinical Pharmacist Phone: [phone]

## 2022-06-11 NOTE — Progress Notes (Signed)
Progress Note  Patient Name: [name] Date of Encounter: 06/11/2022  Primary Cardiologist: [place]  Subjective   Overnight blood pressure has improved. Patient notes that she is so much abdominal pain she isn't really concerned about much else. No CP, SOB, Palpitations.  Inpatient Medications    Scheduled Meds:  acetaminophen  1,000 mg Oral Q8H   apixaban  5 mg Oral BID   Chlorhexidine Gluconate Cloth  6 each Topical Q0600   feeding supplement  1 Container Oral TID BM   flecainide  50 mg Oral Q12H   furosemide  20 mg Oral BID   gabapentin  1,200 mg Oral QHS   icosapent Ethyl  2 g Oral BID   levothyroxine  75 mcg Oral Q breakfast   metoprolol succinate  50 mg Oral Daily   pantoprazole  40 mg Oral Daily   ramelteon  8 mg Oral QHS   sodium chloride flush  3 mL Intravenous Q12H   traZODone  100 mg Oral QHS   venlafaxine XR  225 mg Oral Daily   Continuous Infusions:  sodium chloride 10 mL/hr at 06/07/22 2257   lactated ringers 10 mL/hr at 06/07/22 0801   PRN Meds: sodium chloride, albuterol, butalbital-acetaminophen-caffeine, hyoscyamine, morphine injection, naLOXone (NARCAN)  injection, ondansetron **OR** ondansetron (ZOFRAN) IV, oxyCODONE, polyethylene glycol   Vital Signs    Vitals:   06/11/22 0326 06/11/22 0756 06/11/22 0900 06/11/22 1135  BP: 139/77 (!) 143/71 (!) 155/76 (!) 156/77  Pulse: 64 63 65 65  Resp: '14 16 18 16  '$ Temp: 98.3 F (36.8 C) 98.1 F (36.7 C)  97.9 F (36.6 C)  TempSrc: Oral Oral  Oral  SpO2: 93% 95% 92% 92%  Weight:      Height:        Intake/Output Summary (Last 24 hours) at 06/11/2022 1146 Last data filed at 06/11/2022 0326 Gross per 24 hour  Intake 440 ml  Output 2000 ml  Net -1560 ml   Filed Weights   05/26/22 1043 06/07/22 0739  Weight: 111.2 kg 104.3 kg    Telemetry    SR - Personally Reviewed  ECG    SR 06/09/22 - Personally Reviewed  Physical Exam   Gen: mild distress, morbid obesity   Neck: No  JVD Cardiac: No Rubs or Gallops, soft systolic murmur regular rhythm +2 radial pulses Respiratory: Clear to auscultation bilaterally, normal effort, normal  respiratory rate GI: Soft, but painful on even light tough MS: No edema;  moves all extremities Integument: Skin feels warm Neuro:  At time of evaluation, alert and oriented to person/place/time/situation  Psych: Normal affect, patient feels poorly, but grateful   Labs    Chemistry Recent Labs  Lab 06/08/22 2153 06/09/22 0549 06/10/22 0250 06/11/22 0215  NA 137 137 137 143  K 3.3* 3.8 3.5 3.7  CL 101 102 104 104  CO2 '29 29 28 '$ 34*  GLUCOSE 128* 106* 107* 101*  BUN '11 11 9 '$ 6*  CREATININE 1.34* 1.36* 0.90 0.76  CALCIUM 8.1* 8.2* 8.0* 8.2*  PROT 4.5*  --   --   --   ALBUMIN 2.1*  --   --   --   AST 28  --   --   --   ALT 17  --   --   --   ALKPHOS 70  --   --   --   BILITOT 0.7  --   --   --   GFRNONAA 42* 42* >60 >  Ingram '7 6 5 5     '$ Hematology Recent Labs  Lab 06/09/22 0549 06/10/22 0250 06/11/22 0215  WBC 7.2 5.5 4.3  RBC 2.59* 2.94* 2.95*  HGB 8.0* 8.9* 9.0*  HCT 26.1* 28.5* 28.7*  MCV 100.8* 96.9 97.3  MCH 30.9 30.3 30.5  MCHC 30.7 31.2 31.4  RDW 17.7* 18.7* 18.1*  PLT 187 175 193    Cardiac EnzymesNo results for input(s): "TROPONINI" in the last 168 hours. No results for input(s): "TROPIPOC" in the last 168 hours.   BNPNo results for input(s): "BNP", "PROBNP" in the last 168 hours.   DDimer No results for input(s): "DDIMER" in the last 168 hours.   Radiology    CT HEAD WO CONTRAST (5MM)  Result Date: 06/10/2022 CLINICAL DATA:  Headache, tension type headache. EXAM: CT HEAD WITHOUT CONTRAST TECHNIQUE: Contiguous axial images were obtained from the base of the skull through the vertex without intravenous contrast. RADIATION DOSE REDUCTION: This exam was performed according to the departmental dose-optimization program which includes automated exposure control, adjustment of the mA and/or kV  according to patient size and/or use of iterative reconstruction technique. COMPARISON:  CT head without contrast 04/07/2020 FINDINGS: Brain: No acute infarct, hemorrhage, or mass lesion is present. Heterogeneous hypoattenuation in the thalami are stable. Basal ganglia are intact. Insular ribbon is normal. Scattered white matter hypoattenuation in the corona radiata demonstrates slight progression. No focal cortical abnormalities are present. The ventricles are of normal size. No significant extraaxial fluid collection is present. The brainstem and cerebellum are within normal limits. Vascular: Atherosclerotic calcifications are present in the cavernous internal carotid arteries bilaterally. No hyperdense vessel present. Skull: Calvarium is intact. No focal lytic or blastic lesions are present. No significant extracranial soft tissue lesion is present. Sinuses/Orbits: The paranasal sinuses and mastoid air cells are clear. Right lens replacement noted. Globes and orbits are otherwise within normal limits. IMPRESSION: 1. No acute intracranial abnormality or significant interval change. 2. Heterogeneous hypoattenuation in the thalami bilaterally is stable. This represents remote ischemic insults. 3. Slight progression of white matter disease. This likely reflects the sequela of chronic microvascular ischemia. Electronically Signed   By: San Morelle M.D.   On: 06/10/2022 19:54     Patient Profile     71 y.o. female with PAF s/p ablation well tolerated on AF, HTN and HLD s/p R colectomy for tumor.    Assessment & Plan    PAF Mild MR, normal LV function no prior evidence of ischemia Morbid Obesity Thoracoabdominal surgery HTN and HLD - CHADVASC 3 - In rhythm, continue toprol 50 mg PO daily, will resume her home valsartan today (or [facility] equivalent) - discussed that post surgery could go back into AF - when surgery is amenable, would return eliquis   [facility] will sign off.   Medication  Recommendations:  resume home meds as above Other recommendations (labs, testing, etc):  NA Follow up as an outpatient:  needs [place] F/u    For questions or updates, please contact [facility] Please consult [email or web address] for contact info under Cardiology/STEMI.      Rudean Haskell, MD [place], [address] [place], [place] [address] [phone]  11:46 AM

## 2022-06-11 NOTE — Progress Notes (Signed)
[facility] Progress Note  4 Days Post-Op  Subjective: Ate pizza yesterday.  Tolerating solid diet and having BMs.  Having some pain but controlled with oral pain meds  Objective: Vital signs in last 24 hours: Temp:  [97.8 F (36.6 C)-98.6 F (37 C)] 98.1 F (36.7 C) (08/12 0756) Pulse Rate:  [63-72] 63 (08/12 0756) Resp:  [14-20] 16 (08/12 0756) BP: (139-155)/(68-81) 143/71 (08/12 0756) SpO2:  [93 %-100 %] 95 % (08/12 0756) Last BM Date : 06/09/22  Intake/Output from previous day: 08/11 0701 - 08/12 0700 In: 680 [P.O.:680] Out: 2000 [Urine:2000] Intake/Output this shift: No intake/output data recorded.  PE: Gen:  Alert, NAD Abd: Soft, nondistended, obese, midline incision and lap incisions c/d/I with staples present.  No erythema  Lab Results:  Recent Labs    06/10/22 0250 06/11/22 0215  WBC 5.5 4.3  HGB 8.9* 9.0*  HCT 28.5* 28.7*  PLT 175 193   BMET Recent Labs    06/10/22 0250 06/11/22 0215  NA 137 143  K 3.5 3.7  CL 104 104  CO2 28 34*  GLUCOSE 107* 101*  BUN 9 6*  CREATININE 0.90 0.76  CALCIUM 8.0* 8.2*   PT/INR No results for input(s): "LABPROT", "INR" in the last 72 hours. CMP     Component Value Date/Time   NA 143 06/11/2022 0215   K 3.7 06/11/2022 0215   CL 104 06/11/2022 0215   CO2 34 (H) 06/11/2022 0215   GLUCOSE 101 (H) 06/11/2022 0215   BUN 6 (L) 06/11/2022 0215   CREATININE 0.76 06/11/2022 0215   CREATININE 0.85 05/25/2022 0000   CALCIUM 8.2 (L) 06/11/2022 0215   PROT 4.5 (L) 06/08/2022 2153   ALBUMIN 2.1 (L) 06/08/2022 2153   AST 28 06/08/2022 2153   ALT 17 06/08/2022 2153   ALKPHOS 70 06/08/2022 2153   BILITOT 0.7 06/08/2022 2153   GFRNONAA >60 06/11/2022 0215   GFRNONAA 64 03/11/2020 1202   GFRAA 74 03/11/2020 1202   Lipase     Component Value Date/Time   LIPASE 25 05/29/2022 1634       Studies/Results: CT HEAD WO CONTRAST (5MM)  Result Date: 06/10/2022 CLINICAL DATA:  Headache, tension type  headache. EXAM: CT HEAD WITHOUT CONTRAST TECHNIQUE: Contiguous axial images were obtained from the base of the skull through the vertex without intravenous contrast. RADIATION DOSE REDUCTION: This exam was performed according to the departmental dose-optimization program which includes automated exposure control, adjustment of the mA and/or kV according to patient size and/or use of iterative reconstruction technique. COMPARISON:  CT head without contrast 04/07/2020 FINDINGS: Brain: No acute infarct, hemorrhage, or mass lesion is present. Heterogeneous hypoattenuation in the thalami are stable. Basal ganglia are intact. Insular ribbon is normal. Scattered white matter hypoattenuation in the corona radiata demonstrates slight progression. No focal cortical abnormalities are present. The ventricles are of normal size. No significant extraaxial fluid collection is present. The brainstem and cerebellum are within normal limits. Vascular: Atherosclerotic calcifications are present in the cavernous internal carotid arteries bilaterally. No hyperdense vessel present. Skull: Calvarium is intact. No focal lytic or blastic lesions are present. No significant extracranial soft tissue lesion is present. Sinuses/Orbits: The paranasal sinuses and mastoid air cells are clear. Right lens replacement noted. Globes and orbits are otherwise within normal limits. IMPRESSION: 1. No acute intracranial abnormality or significant interval change. 2. Heterogeneous hypoattenuation in the thalami bilaterally is stable. This represents remote ischemic insults. 3. Slight progression of white matter disease. This likely reflects  the sequela of chronic microvascular ischemia. Electronically Signed   By: San Morelle M.D.   On: 06/10/2022 19:54    Anti-infectives: Anti-infectives (From admission, onward)    Start     Dose/Rate Route Frequency Ordered Stop   06/07/22 2200  cefoTEtan (CEFOTAN) 1 g in sodium chloride 0.9 % 100 mL IVPB         1 g 200 mL/hr over 30 Minutes Intravenous Every 12 hours 06/07/22 1216 06/08/22 1015   06/07/22 0830  cefoTEtan (CEFOTAN) 2 g in sodium chloride 0.9 % 100 mL IVPB        2 g 200 mL/hr over 30 Minutes Intravenous On call to O.R. 06/07/22 0825 06/07/22 0912   05/27/22 1030  piperacillin-tazobactam (ZOSYN) IVPB 3.375 g        3.375 g 12.5 mL/hr over 240 Minutes Intravenous Every 8 hours 05/27/22 0943 06/03/22 1359        Assessment/Plan POD#4 S/P LAPAROSCOPIC ASSISTED RIGHT COLECTOMY 06/07/22 Dr. 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  - tolerating soft diet - Ok to resume DOAC - PT/OT - stable for discharge from [facility] perspective . PT/OT recommending Home health. Pt states her daughter and son may be able to provide intermittent supervision. -d/w primary team, still working on some medical things.  Likely DC Monday.  Will make prn on Sunday and see again Monday  FEN - soft diet, ensure VTE - SCDs, Eliquis ID - cefotetan  ABL anemia - stable AKI - resolved Sinus bracycardia HTN HLD A. Fib s/p ablation on Eliquis at baseline  Hypothyroidism    LOS: 16 days    Henreitta Cea, [facility] Please see Amion for pager number during weekday hours 7:00am-4:30pm, weekends from 7:00am-11:30am

## 2022-06-12 DIAGNOSIS — I480 Paroxysmal atrial fibrillation: Secondary | ICD-10-CM | POA: Diagnosis not present

## 2022-06-12 DIAGNOSIS — D649 Anemia, unspecified: Secondary | ICD-10-CM | POA: Diagnosis not present

## 2022-06-12 DIAGNOSIS — I5033 Acute on chronic diastolic (congestive) heart failure: Secondary | ICD-10-CM | POA: Diagnosis not present

## 2022-06-12 LAB — CBC
HCT: 29.3 % — ABNORMAL LOW (ref 36.0–46.0)
Hemoglobin: 9.1 g/dL — ABNORMAL LOW (ref 12.0–15.0)
MCH: 29.8 pg (ref 26.0–34.0)
MCHC: 31.1 g/dL (ref 30.0–36.0)
MCV: 96.1 fL (ref 80.0–100.0)
Platelets: 206 10*3/uL (ref 150–400)
RBC: 3.05 MIL/uL — ABNORMAL LOW (ref 3.87–5.11)
RDW: 17.3 % — ABNORMAL HIGH (ref 11.5–15.5)
WBC: 4.1 10*3/uL (ref 4.0–10.5)
nRBC: 0 % (ref 0.0–0.2)

## 2022-06-12 LAB — BASIC METABOLIC PANEL
Anion gap: 6 (ref 5–15)
BUN: 8 mg/dL (ref 8–23)
CO2: 35 mmol/L — ABNORMAL HIGH (ref 22–32)
Calcium: 8.5 mg/dL — ABNORMAL LOW (ref 8.9–10.3)
Chloride: 102 mmol/L (ref 98–111)
Creatinine, Ser: 0.73 mg/dL (ref 0.44–1.00)
GFR, Estimated: >60 mL/min (ref >60)
Glucose, Bld: 130 mg/dL — ABNORMAL HIGH (ref 70–99)
Potassium: 3.8 mmol/L (ref 3.5–5.1)
Sodium: 143 mmol/L (ref 135–145)

## 2022-06-12 MED ORDER — FUROSEMIDE 40 MG PO TABS
40.0000 mg | ORAL_TABLET | Freq: Two times a day (BID) | ORAL | Status: DC
  Administered 2022-06-12 – 2022-06-13 (×4): 40 mg via ORAL
  Filled 2022-06-12 (×4): qty 1

## 2022-06-12 MED ORDER — POTASSIUM CHLORIDE CRYS ER 20 MEQ PO TBCR
40.0000 meq | EXTENDED_RELEASE_TABLET | Freq: Once | ORAL | Status: AC
Start: 2022-06-12 — End: 2022-06-12
  Administered 2022-06-12: 40 meq via ORAL
  Filled 2022-06-12: qty 2

## 2022-06-12 NOTE — Progress Notes (Signed)
[name],  As you know, the ulcerated mass in your colon was not malignant. This ulcer may have been related to an episode of ischemic colitis.  The polyp which I removed during your recent procedure was proven to be completely benign but is considered a "pre-cancerous" polyp that MAY have grown into cancer if it had not been removed.  Studies shows that at least 20% of women over age 72 and 30% of men over age 24 have pre-cancerous polyps.  Based on current nationally recognized surveillance guidelines, I recommend that you have a repeat colonoscopy in 7 years.  Colon cancer screening after age 52 is performed on a case by case basis.  Given that you would be 71 years old, I would recommend an office visit with myself or Dr. Rush Landmark to discuss the benefits/risks of further colon cancer screening.  If you develop any new rectal bleeding, abdominal pain or significant bowel habit changes, please contact me before then.

## 2022-06-12 NOTE — TOC Initial Note (Signed)
Transition of Care (TOC) - Initial/Assessment Note  Marvetta Gibbons RN, BSN Transitions of Care Unit 4E- RN Case Manager See Treatment Team for direct phone #     Patient Details  Name: [name] MRN: [id number] Date of Birth: 26-Jul-1951  Transition of Care [facility]) CM/SW Contact:    Dawayne Patricia, RN Phone Number: 06/12/2022, 2:31 PM  Clinical Narrative:                 Pt from home s/p laparoscopic-assisted right colectomy on 06/07/2022. Anticipating return home w/ HH- orders placed for HHRN/PT/OT/aide.  CM in to speak with pt- son and daughter present at bedside.  Discussed recommendations by PT/OT for [facility] f/u with supervision. Both son and daughter stating that they work and worried about pt being left alone during day. Discussed with them that insurance likely not to approve rehab stay with current recommendations nor would insurance cover cost for in-home care while they are at work. Cost for in-home care (private duty) would be out of pocket and something that family would have to arrange.  Discussed [place] services and list provided for [facility] choice Per CMS guidelines from medicare.gov website with star ratings (copy placed in shadow chart), Per family plan is for pt to go home w/son initially.  Address for son Gaspar Bidding is [address], [place] [address]. All phone # verified in epic as well as PCP.   Additional Medicare choice list provided for [facility] zip code for review. Family to review today and CM will f/u in am for choice. Explained that [facility] services is based on availability and agency accepting insurance.   DME also discussed- per son pt has RW at home, reviewed recommendations by PT/OT, BSC/3n1- family does want for home as well as requesting tub bench. Per son pt has [facility] dual plan that should cover DME- explained that in house provider would verify benefits and let them know about any cost.   TOC to f/u with family in am for [facility] referral needs.     Expected Discharge Plan: [place] Barriers to Discharge: No Barriers Identified   Patient Goals and CMS Choice Patient states their goals for this hospitalization and ongoing recovery are:: return home CMS Medicare.gov Compare Post Acute Care list provided to:: Patient Choice offered to / list presented to : Patient, Adult Children  Expected Discharge Plan and Services Expected Discharge Plan: [place]   Discharge Planning Services: CM Consult Post Acute Care Choice: Durable Medical Equipment, Home Health Living arrangements for the past 2 months: Single Family Home                 DME Arranged: 3-N-1, Tub bench DME Agency: [employer] Date DME Agency Contacted: 06/12/22 Time DME Agency Contacted: 62 Representative spoke with at DME Agency: [place]: RN, PT, OT, Nurse's Aide          Prior Living Arrangements/Services Living arrangements for the past 2 months: [place] with:: Self, Adult Children Patient language and need for interpreter reviewed:: Yes Do you feel safe going back to the place where you live?: Yes      Need for Family Participation in Patient Care: Yes (Comment) Care giver support system in place?: Yes (comment) (going to stay at son's home) Current home services: DME (rolling walker) Criminal Activity/Legal Involvement Pertinent to Current Situation/Hospitalization: No - Comment as needed  Activities of Daily Living Home Assistive Devices/Equipment: Hearing aid,  Eyeglasses ADL Screening (condition at time of admission) Patient's cognitive ability adequate to safely complete daily activities?: Yes Is the patient deaf or have difficulty hearing?: No Does the patient have difficulty seeing, even when wearing glasses/contacts?: No Does the patient have difficulty concentrating, remembering, or making decisions?: No Patient able to express need for assistance with ADLs?: Yes Does the  patient have difficulty dressing or bathing?: No Independently performs ADLs?: Yes (appropriate for developmental age) Does the patient have difficulty walking or climbing stairs?: No Weakness of Legs: None Weakness of Arms/Hands: None  Permission Sought/Granted Permission sought to share information with : Facility Art therapist granted to share information with : Yes, Verbal Permission Granted     Permission granted to share info w AGENCY: DME/HH        Emotional Assessment Appearance:: Appears stated age Attitude/Demeanor/Rapport: Engaged Affect (typically observed): Accepting, Appropriate Orientation: : Oriented to Self, Oriented to Place, Oriented to  Time, Oriented to Situation Alcohol / Substance Use: Not Applicable Psych Involvement: No (comment)  Admission diagnosis:  Lower gastrointestinal bleeding [K92.2] Hypokalemia [E87.6] GI bleed [K92.2] Macrocytosis [D75.89] Chronic atrial fibrillation (HCC) [I48.20] Anemia associated with acute blood loss [D62] Patient Active Problem List   Diagnosis Date Noted   GI bleed 05/26/2022   Symptomatic anemia 05/26/2022   History of colitis 05/26/2022   AKI (acute kidney injury) ([place]) 05/26/2022   Diarrhea 05/26/2022   Paroxysmal atrial fibrillation ([place]) 05/26/2022   Chronic anticoagulation 05/26/2022   Colitis 05/23/2022   Idiopathic insomnia 11/22/2021   Morbid obesity ([place]) 11/22/2021   Acute prerenal azotemia 06/10/2021   Injury of foot, left 06/10/2021   Polypharmacy 10/06/2020   Primary osteoarthritis of both knees 05/29/2020   Positive colorectal cancer screening using Cologuard test 06/05/2019   Lumbar spinal stenosis 03/27/2019   Localized swelling of lower extremity 03/27/2019   Mixed hyperlipidemia 08/19/2018   Annual physical exam 08/17/2018   Chronic atrial fibrillation ([place]) 08/17/2018   Benign essential hypertension 08/17/2018   Depression, major 08/17/2018   Hypothyroidism 08/17/2018    GERD (gastroesophageal reflux disease) 08/17/2018   Coughing 12/29/2008   PCP:  Silverio Decamp, MD Pharmacy:   [facility] [id number] - [place], [employer] - [place] [address] Phone: [phone] Fax: [phone]     Social Determinants of Health (SDOH) Interventions    Readmission Risk Interventions     No data to display

## 2022-06-12 NOTE — Progress Notes (Signed)
Mobility Specialist Progress Note    06/12/22 1400  Mobility  Activity Ambulated with assistance in hallway  Level of Assistance Contact guard assist, steadying assist  Assistive Device Front wheel walker  Distance Ambulated (ft) 50 ft  Activity Response Tolerated fair  $Mobility charge 1 Mobility   Pt received in BR and agreeable. C/o dizziness, took x1 seated rest break. Returned to chair with family present.   Hildred Alamin Mobility Specialist

## 2022-06-12 NOTE — Progress Notes (Signed)
PROGRESS NOTE    [name]  LFY:[id number] DOB: 1951/05/01 DOA: 05/26/2022 PCP: Silverio Decamp, MD   Brief Narrative:   71 year old with history of hypertension, hyperlipidemia, chronic A-fib on Eliquis status post ablation, hypothyroidism GERD and anxiety presented to the ER with low hemoglobin.  Patient recently suffering from multiple abdominal symptoms ongoing nausea vomiting poor appetite and diarrhea since 7/9.   Previous hospitalization 7/9-7/17, admitted to [place] with nausea vomiting and diarrhea, CT scan consistent with right-sided colitis and pneumatosis coli in the proximal hepatic flexure.  Initially treated with IV antibiotics, changed to Augmentin and discharged home where she completed her antibiotic course.     7/24 - (current admission) -noted to have bright red blood per rectum, instructed to discontinue Eliquis in the outpatient setting but with worsening symptoms of fatigue and weakness presented to the ED for symptoms noted to be profoundly anemic requiring transfusion, GI consulted. -Underwent upper GI endoscopy and colonoscopy on 06/02/2022.  Found to have colonic mass and underwent laparoscopic-assisted right colectomy on 06/07/2022.    Assessment & Plan:   Acute symptomatic blood loss anemia due to GI bleed: -Suspected infectious versus ischemic colitis -Hemoglobin 6.6 at intake status post 2 unit PRBC on 05/26/2022, with no further bleeding;  -  Repeat CT abd/pelvis shows minimally worsening colitis and due to persistent symptoms she underwent upper GI endoscopy which shows low-grade of narrowing Schatzki ring, small hiatal hernia.  Colonoscopy shows obstructing large mass found in the hepatic flexure.  Zosyn discontinued.  Biopsies were taken.  General surgery consulted.  CT chest ruled out metastatic disease.  Patient underwent laparoscopic-assisted right colectomy on 06/07/2022 by Dr. Rosendo Gros. 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 -CEA 0.9, no evidence of metastasis on CT chest/abdomen/pelvis -Status post laparoscopic assisted right colectomy by Dr. Rosendo Gros on 8/8 -Postop care by general surgery, she is tolerating soft diet. -Final surgical pathology with no evidence of malignancy  Paroxysmal atrial fibrillation: -On Eliquis for anticoagulation . -Cardiology input greatly appreciated, continue with flecainide, back on metoprolol .  Acute on chronic diastolic CHF -Patient with some edema, likely provoked by IV fluid resuscitation she received earlier, now blood pressure is appropriate started on IV diuresis  Essential hypertension:  -Continue to monitor    Hypothyroidism:  -Euthyroid on Synthroid replacement.   Acute hypoxic respiratory failure/atelectasis: -Rsolved, she is on room air currently  Chronic insomnia/anxiety/depression: -Continue home meds ramelteon, trazodone, Effexor  GERD: Continue PPI  AKI: Resolved  Hypokalemia: Resolved  Rosacea?:  Redness around cheeks comes and goes.  Likely stress-induced.  No treatment indicated.  Bilateral lower extremity edema: Likely chronic.  She is on Lasix 10 mg p.o. twice daily at home which I increased to 20 mg p.o. daily on 06/05/2022.  Hypotension/bradycardia -Improving, continue to monitor on telemetry now she is back on metoprolol, she remains on flecainide, midodrine has been discontinued.  Urinary retention -Required Foley catheter insertion, Foley has been discontinued, no evidence of retention  DVT prophylaxis: back on Eliquis Code Status: Full code Family Communication: D/W son by phone 8/11 Disposition Plan: likely in 2 to 3 days  Consultants:  GI General  Surgery Cardiology  Procedures:  EGD Colonoscopy  Antimicrobials:  Zosyn  Status is: Inpatient  Subjective:  No significant events overnight, she reports generalized weakness and fatigue, no nausea, no vomiting   Objective: Vitals:   06/11/22 2011 06/11/22 2358 06/12/22 0319 06/12/22 0755  BP: (!) 152/74  (!) 169/83 (!) 171/82  Pulse: 64 68 61 64  Resp: '15 17 14 14  '$ Temp: 98 F (36.7 C) 98 F (36.7 C) 98 F (36.7 C) 97.9 F (36.6 C)  TempSrc: Oral Oral Oral Oral  SpO2: 90% 90% 92% 92%  Weight:      Height:        Intake/Output Summary (Last 24 hours) at 06/12/2022 1004 Last data filed at 06/12/2022 0924 Gross per 24 hour  Intake --  Output 450 ml  Net -450 ml   Filed Weights   05/26/22 1043 06/07/22 0739  Weight: 111.2 kg 104.3 kg    Examination:  Awake Alert, Oriented X 3, No new F.N deficits, Normal affect Symmetrical Chest wall movement, Good air movement bilaterally, CTAB RRR,No Gallops,Rubs or new Murmurs, No Parasternal Heave +ve B.Sounds, Abd Soft, abd  wound bandaged No Cyanosis, Clubbing, +1 edema, No new Rash or bruise      Data Reviewed: I have personally reviewed following labs and imaging studies  CBC: Recent Labs  Lab 06/08/22 2153 06/09/22 0549 06/10/22 0250 06/11/22 0215 06/12/22 0154  WBC 7.4 7.2 5.5 4.3 4.1  HGB 7.7* 8.0* 8.9* 9.0* 9.1*  HCT 25.5* 26.1* 28.5* 28.7* 29.3*  MCV 100.4* 100.8* 96.9 97.3 96.1  PLT 182 187 175 193 295   Basic Metabolic Panel: Recent Labs  Lab 06/08/22 2153 06/09/22 0549 06/10/22 0250 06/11/22 0215 06/12/22 0154  NA 137 137 137 143 143  K 3.3* 3.8 3.5 3.7 3.8  CL 101 102 104 104 102  CO2 '29 29 28 '$ 34* 35*  GLUCOSE 128* 106* 107* 101* 130*  BUN '11 11 9 '$ 6* 8  CREATININE 1.34* 1.36* 0.90 0.76 0.73  CALCIUM 8.1* 8.2* 8.0* 8.2* 8.5*  MG 1.8 1.8  --   --   --   PHOS  --  3.1  --   --   --    GFR: Estimated Creatinine Clearance: 74.5 mL/min (by C-G formula based on SCr of 0.73  mg/dL). Liver Function Tests: Recent Labs  Lab 06/08/22 2153  AST 28  ALT 17  ALKPHOS 70  BILITOT 0.7  PROT 4.5*  ALBUMIN 2.1*    No results for input(s): "LIPASE", "AMYLASE" in the last 168 hours.  No results for input(s): "AMMONIA" in the last 168 hours. Coagulation Profile: No results for input(s): "INR", "PROTIME" in the last 168 hours. Cardiac Enzymes: No results for input(s): "CKTOTAL", "CKMB", "CKMBINDEX", "TROPONINI" in the last 168 hours. BNP (last 3 results) No results for input(s): "PROBNP" in the last 8760 hours. HbA1C: No results for input(s): "HGBA1C" in the last 72 hours. CBG: Recent Labs  Lab 06/08/22 1745  GLUCAP 126*   Lipid Profile: No results for input(s): "CHOL", "HDL", "LDLCALC", "TRIG", "CHOLHDL", "LDLDIRECT" in the last 72 hours. Thyroid Function Tests: No results for input(s): "TSH", "T4TOTAL", "FREET4", "T3FREE", "THYROIDAB" in the last 72 hours. Anemia Panel: No results for input(s): "VITAMINB12", "FOLATE", "FERRITIN", "TIBC", "IRON", "RETICCTPCT" in the last 72 hours. Sepsis Labs: No results for input(s): "PROCALCITON", "LATICACIDVEN" in the last 168 hours.   Recent Results (from the past 240 hour(s))  Surgical pcr screen     Status: None   Collection Time: 06/07/22  3:45 AM   Specimen: Nasal Mucosa; Nasal Swab  Result Value Ref Range  Status   MRSA, PCR NEGATIVE NEGATIVE Final   Staphylococcus aureus NEGATIVE NEGATIVE Final    Comment: (NOTE) The Xpert SA Assay (FDA approved for NASAL specimens in patients 9 years of age and older), is one component of a comprehensive surveillance program. It is not intended to diagnose infection nor to guide or monitor treatment. Performed at [facility], [place] [address] [place], [place] [address]       Radiology Studies: CT HEAD WO CONTRAST (5MM)  Result Date: 06/10/2022 CLINICAL DATA:  Headache, tension type headache. EXAM: CT HEAD WITHOUT CONTRAST TECHNIQUE: Contiguous axial images  were obtained from the base of the skull through the vertex without intravenous contrast. RADIATION DOSE REDUCTION: This exam was performed according to the departmental dose-optimization program which includes automated exposure control, adjustment of the mA and/or kV according to patient size and/or use of iterative reconstruction technique. COMPARISON:  CT head without contrast 04/07/2020 FINDINGS: Brain: No acute infarct, hemorrhage, or mass lesion is present. Heterogeneous hypoattenuation in the thalami are stable. Basal ganglia are intact. Insular ribbon is normal. Scattered white matter hypoattenuation in the corona radiata demonstrates slight progression. No focal cortical abnormalities are present. The ventricles are of normal size. No significant extraaxial fluid collection is present. The brainstem and cerebellum are within normal limits. Vascular: Atherosclerotic calcifications are present in the cavernous internal carotid arteries bilaterally. No hyperdense vessel present. Skull: Calvarium is intact. No focal lytic or blastic lesions are present. No significant extracranial soft tissue lesion is present. Sinuses/Orbits: The paranasal sinuses and mastoid air cells are clear. Right lens replacement noted. Globes and orbits are otherwise within normal limits. IMPRESSION: 1. No acute intracranial abnormality or significant interval change. 2. Heterogeneous hypoattenuation in the thalami bilaterally is stable. This represents remote ischemic insults. 3. Slight progression of white matter disease. This likely reflects the sequela of chronic microvascular ischemia. Electronically Signed   By: San Morelle M.D.   On: 06/10/2022 19:54    Scheduled Meds:  acetaminophen  1,000 mg Oral Q8H   apixaban  5 mg Oral BID   feeding supplement  1 Container Oral TID BM   flecainide  50 mg Oral Q12H   furosemide  40 mg Oral BID   gabapentin  1,200 mg Oral QHS   icosapent Ethyl  2 g Oral BID   irbesartan   300 mg Oral Daily   levothyroxine  75 mcg Oral Q breakfast   metoprolol succinate  50 mg Oral Daily   pantoprazole  40 mg Oral Daily   ramelteon  8 mg Oral QHS   sodium chloride flush  3 mL Intravenous Q12H   traZODone  100 mg Oral QHS   venlafaxine XR  225 mg Oral Daily   Continuous Infusions:   LOS: 17 days    Phillips Climes, MD [facility]  If 7PM-7AM, please contact night-coverage [email or web address] 06/12/2022, 10:04 AM

## 2022-06-13 ENCOUNTER — Other Ambulatory Visit (HOSPITAL_COMMUNITY): Payer: Self-pay

## 2022-06-13 DIAGNOSIS — I5033 Acute on chronic diastolic (congestive) heart failure: Secondary | ICD-10-CM

## 2022-06-13 DIAGNOSIS — I5032 Chronic diastolic (congestive) heart failure: Secondary | ICD-10-CM

## 2022-06-13 DIAGNOSIS — R001 Bradycardia, unspecified: Secondary | ICD-10-CM

## 2022-06-13 DIAGNOSIS — I959 Hypotension, unspecified: Secondary | ICD-10-CM

## 2022-06-13 LAB — BASIC METABOLIC PANEL
Anion gap: 10 (ref 5–15)
BUN: 5 mg/dL — ABNORMAL LOW (ref 8–23)
CO2: 34 mmol/L — ABNORMAL HIGH (ref 22–32)
Calcium: 8.7 mg/dL — ABNORMAL LOW (ref 8.9–10.3)
Chloride: 97 mmol/L — ABNORMAL LOW (ref 98–111)
Creatinine, Ser: 0.66 mg/dL (ref 0.44–1.00)
GFR, Estimated: >60 mL/min (ref >60)
Glucose, Bld: 98 mg/dL (ref 70–99)
Potassium: 3.1 mmol/L — ABNORMAL LOW (ref 3.5–5.1)
Sodium: 141 mmol/L (ref 135–145)

## 2022-06-13 LAB — CBC
HCT: 29.7 % — ABNORMAL LOW (ref 36.0–46.0)
Hemoglobin: 9.1 g/dL — ABNORMAL LOW (ref 12.0–15.0)
MCH: 29.4 pg (ref 26.0–34.0)
MCHC: 30.6 g/dL (ref 30.0–36.0)
MCV: 96.1 fL (ref 80.0–100.0)
Platelets: 204 10*3/uL (ref 150–400)
RBC: 3.09 MIL/uL — ABNORMAL LOW (ref 3.87–5.11)
RDW: 16.9 % — ABNORMAL HIGH (ref 11.5–15.5)
WBC: 3.8 10*3/uL — ABNORMAL LOW (ref 4.0–10.5)
nRBC: 0 % (ref 0.0–0.2)

## 2022-06-13 LAB — MAGNESIUM: Magnesium: 1.7 mg/dL (ref 1.7–2.4)

## 2022-06-13 MED ORDER — POTASSIUM CHLORIDE CRYS ER 10 MEQ PO TBCR
20.0000 meq | EXTENDED_RELEASE_TABLET | Freq: Every day | ORAL | 0 refills | Status: DC
  Filled 2022-06-13: qty 30, 15d supply, fill #0

## 2022-06-13 MED ORDER — FUROSEMIDE 40 MG PO TABS
40.0000 mg | ORAL_TABLET | Freq: Every day | ORAL | 0 refills | Status: DC
  Filled 2022-06-13: qty 30, 30d supply, fill #0

## 2022-06-13 MED ORDER — OXYCODONE HCL 5 MG PO TABS
5.0000 mg | ORAL_TABLET | Freq: Four times a day (QID) | ORAL | 0 refills | Status: DC | PRN
  Filled 2022-06-13: qty 15, 4d supply, fill #0

## 2022-06-13 MED ORDER — MAGNESIUM SULFATE 2 GM/50ML IV SOLN
2.0000 g | Freq: Once | INTRAVENOUS | Status: AC
  Administered 2022-06-13: 2 g via INTRAVENOUS
  Filled 2022-06-13: qty 50

## 2022-06-13 MED ORDER — APIXABAN 5 MG PO TABS
5.0000 mg | ORAL_TABLET | Freq: Two times a day (BID) | ORAL | 0 refills | Status: DC
  Filled 2022-06-13: qty 60, 30d supply, fill #0

## 2022-06-13 MED ORDER — ACETAMINOPHEN 500 MG PO TABS: 500.0000 mg | ORAL_TABLET | Freq: Four times a day (QID) | ORAL | 0 refills | Status: DC | PRN

## 2022-06-13 MED ORDER — POTASSIUM CHLORIDE CRYS ER 20 MEQ PO TBCR
40.0000 meq | EXTENDED_RELEASE_TABLET | ORAL | Status: AC
  Administered 2022-06-13 (×3): 40 meq via ORAL
  Filled 2022-06-13 (×2): qty 2

## 2022-06-13 NOTE — TOC Transition Note (Signed)
Transition of Care (TOC) - CM/SW Discharge Note Marvetta Gibbons RN, BSN Transitions of Care Unit 4E- RN Case Manager See Treatment Team for direct phone #    Patient Details  Name: [name] MRN: [id number] Date of Birth: [birth year]  Transition of Care [facility]) CM/SW Contact:  Dawayne Patricia, RN Phone Number: 06/13/2022, 1:44 PM   Clinical Narrative:    Pt stable for transition home today to son Bryan's home. Pt has worked w/ PT this am and plan remains to return home w/ [facility] services. Pt was able to work on stairs and PT feels pt can safely travel home by private vehicle w/ family.   DME has been delivered to the room by [employer].   CM spoke with patient and daughter at bedside to f/u on [facility] choice- daughter contacted son-Bryan via TC while in room. Gaspar Bidding has selected top choices as [facility], [facility] ([facility] and [facility]. CM will contact each to see about availability.  Call made to [facility]- Per Tommi Rumps- the regional office that [facility] unable to accept due to delay in staffing for nursing.  [facility]-(spoke w/ Caryl Pina) unable to accept due to low staffing in [facility] area [employer]- (spoke w/ Malachy Mood)  unable to accept due to low staffing in [place] area  Returned to room to let pt and daughter know about selected agencies and unable to accept due to staffing- per daughter they do not have any further preference only to call next agencies based on highest star ratings moving down to see if agency can be secured.   1330- Call made to [facility] (spoke w/ Claiborne Billings)- Stoy can service [facility] out of [place] office and has accepted referral- for HHRN/PT/OT- they may also have an aide available for bath assistance- will check and let family know. Start of care to be within 24-48 hr post discharge.  CM updated pt and daughter at bedside.     Final next level of care: [place] Barriers to Discharge: No Barriers Identified   Patient Goals and  CMS Choice Patient states their goals for this hospitalization and ongoing recovery are:: return home CMS Medicare.gov Compare Post Acute Care list provided to:: Patient Choice offered to / list presented to : Patient, Adult Children  Discharge Placement                 Home w/ [facility]      Discharge Plan and Services   Discharge Planning Services: CM Consult Post Acute Care Choice: Durable Medical Equipment, Home Health          DME Arranged: 3-N-1, Tub bench DME Agency: [employer] Date DME Agency Contacted: 06/12/22 Time DME Agency Contacted: [id number] Representative spoke with at DME Agency: [place]: RN, PT, OT, Nurse's Aide [place] Agency: [place] Date [place]: 06/13/22 Time [place]: [id number] Representative spoke with at [place]: [place]) Interventions     Readmission Risk Interventions    06/13/2022    1:44 PM  Readmission Risk Prevention Plan  Transportation Screening Complete  PCP or Specialist Appt within 3-5 Days Complete  HRI or [place] Complete  Social Work Consult for [place] Planning/Counseling Complete  Palliative Care Screening Not Applicable  Medication Review Press photographer) Complete

## 2022-06-13 NOTE — Progress Notes (Signed)
Physical Therapy Treatment Patient Details Name: [name] MRN: [id number] DOB: 07-29-1951 Today's Date: 06/13/2022   History of Present Illness 71 yo female with onset of abd pain, N/V/D. and rectal bleeding was admitted on 7/27,Status post laparoscopic assisted right colectomy by Dr. Rosendo Gros on 8/8.  PMHx:  hypertension, hyperlipidemia, chronic A-fib on Eliquis status post ablation, hypothyroidism GERD and anxiety    PT Comments    Patient making good progress with mobility and demonstrated safe management of RW for ambualtion in room. Pt able to complete bed mobility and transfers with supervision and extra time, pt demonstrated safe technique and no cues needed. Ambulation distance increased and gait initiated with no AD, pt amb ~120' with standing rest breaks and stair mobility halfway followed by seated rest. Pt was able to ascend/descend with single rail and min guar for safety. Educated pt and daughter on safe strategies for stair management to have seat available for rest. Pt has rollator at home for longer distances for rest breaks while walking. She is mobilizing at safe level to return home with assist from son and daughter intermittently and HHPT follow up. Will continue to progress during stay.     Recommendations for follow up therapy are one component of a multi-disciplinary discharge planning process, led by the attending physician.  Recommendations may be updated based on patient status, additional functional criteria and insurance authorization.  Follow Up Recommendations  Home health PT     Assistance Recommended at Discharge Frequent or constant Supervision/Assistance  Patient can return home with the following A little help with walking and/or transfers;A little help with bathing/dressing/bathroom;Assistance with cooking/housework;Direct supervision/assist for medications management;Assist for transportation;Help with stairs or ramp for entrance   Equipment  Recommendations  Rolling walker (2 wheels)    Recommendations for Other Services       Precautions / Restrictions Precautions Precautions: Fall;Other (comment) Precaution Comments: abdominal for comfort Restrictions Weight Bearing Restrictions: No     Mobility  Bed Mobility Overal bed mobility: Needs Assistance Bed Mobility: Supine to Sit     Supine to sit: Supervision, HOB elevated     General bed mobility comments: no assist or cues needed, pt taking extra time and use of bed rail    Transfers Overall transfer level: Needs assistance Equipment used: None, Rolling walker (2 wheels) Transfers: Sit to/from Stand             General transfer comment: pt using UE singel Ue on RW and on EOB for power up,use of grab bar in bathroom. pt demostrates safe reach back to control lowering to chair and able to scoot/reposition in sitting.    Ambulation/Gait Ambulation/Gait assistance: Min guard, Supervision Gait Distance (Feet): 120 Feet Assistive device: Rolling walker (2 wheels), None Gait Pattern/deviations: Step-through pattern, Antalgic Gait velocity: decr     General Gait Details: pt with slow/cautious gait, amb short bout in room to BR and demonstrated steady gait with RW, no cues needed for management. pt amb ~120' with 4 standing rest breaks and one seated rest after stairs due to fatigue, no AD for support. Gait slow and cautious, ocasionally pt reaching for support.   Stairs Stairs: Yes Stairs assistance: Min guard Stair Management: One rail Right, Step to pattern, Sideways Number of Stairs: 3 General stair comments: pt ascend/descend with step to pattern and bil UE support. no LOB or bucklign noted. guarding for safety and cues for standing rest prior to ascent and after descent.   Wheelchair Mobility  Modified Rankin (Stroke Patients Only)       Balance Overall balance assessment: Needs assistance Sitting-balance support: Feet supported Sitting  balance-Leahy Scale: Good Sitting balance - Comments: able to complete shifting for pericare in sitting   Standing balance support: Bilateral upper extremity supported, No upper extremity supported, During functional activity Standing balance-Leahy Scale: Fair Standing balance comment: balance improved with RW for support                            Cognition Arousal/Alertness: Awake/alert Behavior During Therapy: WFL for tasks assessed/performed Overall Cognitive Status: Within Functional Limits for tasks assessed                                          Exercises Other Exercises Other Exercises: encouraged use of bathroom rrather than BSC for increased ambulation. Other Exercises: discussed progression of ambulation and mobility.    General Comments        Pertinent Vitals/Pain Pain Assessment Pain Assessment: Faces Faces Pain Scale: Hurts little more Pain Location: abdomen Pain Descriptors / Indicators: Sore Pain Intervention(s): Limited activity within patient's tolerance, Monitored during session, Repositioned    Home Living                          Prior Function            PT Goals (current goals can now be found in the care plan section) Acute Rehab PT Goals Patient Stated Goal: to get home and feel better PT Goal Formulation: With patient Time For Goal Achievement: 06/13/22 Potential to Achieve Goals: Good Progress towards PT goals: Progressing toward goals    Frequency    Min 3X/week      PT Plan Current plan remains appropriate    Co-evaluation              AM-PAC PT "6 Clicks" Mobility   Outcome Measure  Help needed turning from your back to your side while in a flat bed without using bedrails?: A Little Help needed moving from lying on your back to sitting on the side of a flat bed without using bedrails?: A Little Help needed moving to and from a bed to a chair (including a wheelchair)?: A  Little Help needed standing up from a chair using your arms (e.g., wheelchair or bedside chair)?: A Little Help needed to walk in hospital room?: A Little Help needed climbing 3-5 steps with a railing? : A Little 6 Click Score: 18    End of Session Equipment Utilized During Treatment: Gait belt;Oxygen Activity Tolerance: Patient tolerated treatment well Patient left: in chair;with call bell/phone within reach;with family/visitor present Nurse Communication: Mobility status PT Visit Diagnosis: Unsteadiness on feet (R26.81);Muscle weakness (generalized) (M62.81);Dizziness and giddiness (R42)     Time: 4081-4481 PT Time Calculation (min) (ACUTE ONLY): 40 min  Charges:  $Gait Training: 23-37 mins $Therapeutic Activity: 8-22 mins                     Verner Mould, DPT Acute Rehabilitation Services Office [phone] Pager [phone]  06/13/22 10:19 AM

## 2022-06-13 NOTE — Progress Notes (Signed)
Mobility Specialist Progress Note:   06/13/22 1517  Therapy Vitals  Temp 98.5 F (36.9 C)  Temp Source Oral  Pulse Rate 66  Resp 15  BP (!) 179/82  Patient Position (if appropriate) Lying  Oxygen Therapy  SpO2 96 %  O2 Device Room Air  Mobility  Activity Ambulated with assistance in hallway  Level of Assistance Contact guard assist, steadying assist  Assistive Device Front wheel walker  Distance Ambulated (ft) 84 ft  Activity Response Tolerated well  $Mobility charge 1 Mobility    Pre-mobility: 176/86 (86) BP Post-mobility: 193/87 (118) BP  Pt in bed and agreeable. Pt c/o abdominal pain throughout ambulation. Pt in bed with all needs met and call bell in reach.     Mobility Specialist-Acute Rehab Secure Chat only  

## 2022-06-13 NOTE — Progress Notes (Signed)
[facility] Progress Note  6 Days Post-Op  Subjective: Ate pizza yesterday.  Tolerating solid diet and having BMs.  Having some pain but controlled with oral pain meds  Objective: Vital signs in last 24 hours: Temp:  [97.9 F (36.6 C)-98.1 F (36.7 C)] 97.9 F (36.6 C) (08/14 0808) Pulse Rate:  [61-65] 65 (08/14 0808) Resp:  [14-18] 14 (08/14 0808) BP: (149-177)/(79-84) 168/84 (08/14 0808) SpO2:  [93 %-95 %] 95 % (08/14 0808) Last BM Date : 06/11/22  Intake/Output from previous day: 08/13 0701 - 08/14 0700 In: -  Out: 1125 [Urine:1125] Intake/Output this shift: No intake/output data recorded.  PE: Gen:  Alert, NAD Abd: Soft, nondistended, obese, midline incision and lap incisions c/d/I with staples present.  No erythema  Lab Results:  Recent Labs    06/12/22 0154 06/13/22 0521  WBC 4.1 3.8*  HGB 9.1* 9.1*  HCT 29.3* 29.7*  PLT 206 204    BMET Recent Labs    06/12/22 0154 06/13/22 0521  NA 143 141  K 3.8 3.1*  CL 102 97*  CO2 35* 34*  GLUCOSE 130* 98  BUN 8 5*  CREATININE 0.73 0.66  CALCIUM 8.5* 8.7*    PT/INR No results for input(s): "LABPROT", "INR" in the last 72 hours. CMP     Component Value Date/Time   NA 141 06/13/2022 0521   K 3.1 (L) 06/13/2022 0521   CL 97 (L) 06/13/2022 0521   CO2 34 (H) 06/13/2022 0521   GLUCOSE 98 06/13/2022 0521   BUN 5 (L) 06/13/2022 0521   CREATININE 0.66 06/13/2022 0521   CREATININE 0.85 05/25/2022 0000   CALCIUM 8.7 (L) 06/13/2022 0521   PROT 4.5 (L) 06/08/2022 2153   ALBUMIN 2.1 (L) 06/08/2022 2153   AST 28 06/08/2022 2153   ALT 17 06/08/2022 2153   ALKPHOS 70 06/08/2022 2153   BILITOT 0.7 06/08/2022 2153   GFRNONAA >60 06/13/2022 0521   GFRNONAA 64 03/11/2020 1202   GFRAA 74 03/11/2020 1202   Lipase     Component Value Date/Time   LIPASE 25 05/29/2022 1634       Studies/Results: No results found.  Anti-infectives: Anti-infectives (From admission, onward)    Start      Dose/Rate Route Frequency Ordered Stop   06/07/22 2200  cefoTEtan (CEFOTAN) 1 g in sodium chloride 0.9 % 100 mL IVPB        1 g 200 mL/hr over 30 Minutes Intravenous Every 12 hours 06/07/22 1216 06/08/22 1015   06/07/22 0830  cefoTEtan (CEFOTAN) 2 g in sodium chloride 0.9 % 100 mL IVPB        2 g 200 mL/hr over 30 Minutes Intravenous On call to O.R. 06/07/22 0825 06/07/22 0912   05/27/22 1030  piperacillin-tazobactam (ZOSYN) IVPB 3.375 g        3.375 g 12.5 mL/hr over 240 Minutes Intravenous Every 8 hours 05/27/22 0943 06/03/22 1359        Assessment/Plan POD#6 S/P LAPAROSCOPIC ASSISTED RIGHT COLECTOMY 06/07/22 Dr. 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  - tolerating soft diet - PT/OT - stable for discharge from [facility] perspective . PT/OT recommending Home health. Pt states her daughter and son are able to provide intermittent supervision. She has pain medication from her prior admission and will not need more at discharge   FEN - soft diet, ensure VTE - SCDs, Eliquis  ID - cefotetan  ABL anemia - stable AKI - resolved Sinus bracycardia HTN HLD A. Fib s/p ablation on Eliquis at baseline  Hypothyroidism    LOS: 18 days    Winferd Humphrey, [facility] Please see Amion for pager number during weekday hours 7:00am-4:30pm, weekends from 7:00am-11:30am

## 2022-06-13 NOTE — Discharge Summary (Signed)
Physician Discharge Summary  [name] JGO:[id number] DOB: 1951-04-29 DOA: 05/26/2022  PCP: Silverio Decamp, MD  Admit date: 05/26/2022 Discharge date: 06/13/2022  Admitted From: Home Disposition:  Home  Recommendations for Outpatient Follow-up:  Follow up with PCP in 1-2 weeks Please obtain BMP/CBC in one week To follow-up with general surgery as an outpatient, appointment has been scheduled for the 22nd for staples removal.  Home Health:YES  Discharge Condition:Stable CODE STATUS:FULL Diet recommendation: Heart Healthy   Brief/Interim Summary:  71 year old with history of hypertension, hyperlipidemia, chronic A-fib on Eliquis status post ablation, hypothyroidism GERD and anxiety presented to the ER with low hemoglobin.  Patient recently suffering from multiple abdominal symptoms ongoing nausea vomiting poor appetite and diarrhea since 7/9.   Previous hospitalization 7/9-7/17, admitted to [place] with nausea vomiting and diarrhea, CT scan consistent with right-sided colitis and pneumatosis coli in the proximal hepatic flexure.  Initially treated with IV antibiotics, changed to Augmentin and discharged home where she completed her antibiotic course.     7/24 - (current admission) -noted to have bright red blood per rectum, instructed to discontinue Eliquis in the outpatient setting but with worsening symptoms of fatigue and weakness presented to the ED for symptoms noted to be profoundly anemic requiring transfusion, GI consulted. -Underwent upper GI endoscopy and colonoscopy on 06/02/2022.  Found to have colonic mass and underwent laparoscopic-assisted right colectomy on 06/07/2022.  Final pathology with no evidence of malignancy   Acute symptomatic blood loss anemia due to GI bleed: -Due to ulceration from ischemic colitis -Hemoglobin 6.6 at intake, required multiple PRBC transfusions, hemoglobin stable at time of discharge. -  Repeat CT abd/pelvis shows  minimally worsening colitis and due to persistent symptoms she underwent upper GI endoscopy which shows low-grade of narrowing Schatzki ring, small hiatal hernia.  Colonoscopy shows obstructing large mass found in the hepatic flexure.  Zosyn discontinued.  Biopsies were taken.  General surgery consulted.  CT chest ruled out metastatic disease.  Patient underwent laparoscopic-assisted right colectomy on 06/07/2022 by Dr. Rosendo Gros.  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 -CEA 0.9, no evidence of metastasis on CT chest/abdomen/pelvis -Status post laparoscopic assisted right colectomy by Dr. Rosendo Gros on 8/8 -Postop care by general surgery, she is tolerating soft diet. -Final surgical pathology with no evidence of malignancy   Paroxysmal atrial fibrillation: -She is back on Eliquis for anticoagulation, aspirin has been discontinued -Cardiology input greatly appreciated, continue with flecainide, back on metoprolol .   Acute on chronic diastolic CHF -Patient with some edema, likely provoked by IV fluid resuscitation she received earlier, now blood pressure is appropriate started on IV diuresis, she is with significant diuresis, she will be discharged on 40 mg of p.o. Lasix and potassium supplements as well. -No hypoxia or oxygen requirement at time of discharge   Essential hypertension:  -Resume home medications on discharge   Hypothyroidism:  -Euthyroid on Synthroid replacement.   Acute hypoxic respiratory failure/atelectasis: -Rsolved, she is on room air currently   Chronic insomnia/anxiety/depression: -Continue home meds ramelteon, trazodone, Effexor   GERD: Continue PPI   AKI: Resolved   Hypokalemia: Being repleted, she will be discharged on oral  supplements for next 15 days as she is on Lasix  Rosacea?:  Redness around cheeks comes and  goes.  Likely stress-induced.  No treatment indicated.   Hypotension/bradycardia -Required midodrine and holding hypertensive medications temporarily, this all has resolved by now, she is off midodrine, tolerating flecainide and metoprolol . -Cardiology consult greatly appreciated .  Urinary retention -Required Foley catheter insertion, Foley has been discontinued, no evidence of retention  Discharge Diagnoses:  Principal Problem:   Symptomatic anemia Active Problems:   GI bleed   AKI (acute kidney injury) ([place])   History of colitis   Diarrhea   Paroxysmal atrial fibrillation (HCC)   Chronic anticoagulation   Benign essential hypertension   Hypothyroidism   GERD (gastroesophageal reflux disease)   Morbid obesity ([place])    Discharge Instructions  Discharge Instructions     Diet - low sodium heart healthy   Complete by: As directed    Discharge instructions   Complete by: As directed    Follow with Primary MD Silverio Decamp, MD in 7 days   Get CBC, CMP,  checked  by Primary MD next visit.    Activity: As tolerated with Full fall precautions use walker/cane & assistance as needed   Disposition Home    Diet: Heart Healthy , with feeding assistance and aspiration precautions.  For Heart failure patients - Check your Weight same time everyday, if you gain over 2 pounds, or you develop in leg swelling, experience more shortness of breath or chest pain, call your Primary MD immediately. Follow Cardiac Low Salt Diet and 1.5 lit/day fluid restriction.   On your next visit with your primary care physician please Get Medicines reviewed and adjusted.   Please request your Prim.MD to go over all Hospital Tests and Procedure/Radiological results at the follow up, please get all Hospital records sent to your Prim MD by signing hospital release before you go home.   If you  experience worsening of your admission symptoms, develop shortness of breath, life threatening emergency, suicidal or homicidal thoughts you must seek medical attention immediately by calling 911 or calling your MD immediately  if symptoms less severe.  You Must read complete instructions/literature along with all the possible adverse reactions/side effects for all the Medicines you take and that have been prescribed to you. Take any new Medicines after you have completely understood and accpet all the possible adverse reactions/side effects.   Do not drive, operating heavy machinery, perform activities at heights, swimming or participation in water activities or provide baby sitting services if your were admitted for syncope or siezures until you have seen by Primary MD or a Neurologist and advised to do so again.  Do not drive when taking Pain medications.    Do not take more than prescribed Pain, Sleep and Anxiety Medications  Special Instructions: If you have smoked or chewed Tobacco  in the last 2 yrs please stop smoking, stop any regular Alcohol  and or any Recreational drug use.  Wear Seat belts while driving.   Please note  You were cared for by a hospitalist during your hospital stay. If you have any questions about your discharge medications or the care you received while you were in the hospital after you are discharged, you can call the unit and asked to speak with the hospitalist on call if the hospitalist that took care of you is not available. Once you are discharged, your primary care physician will handle any further medical issues. Please note that NO REFILLS for any discharge medications will be authorized once you are discharged, as it is imperative that you  return to your primary care physician (or establish a relationship with a primary care physician if you do not have one) for your aftercare needs so that they can reassess your need for medications and monitor your lab  values.   Increase activity slowly   Complete by: As directed    No wound care   Complete by: As directed       Allergies as of 06/13/2022   No Known Allergies      Medication List     STOP taking these medications    amoxicillin-clavulanate 875-125 MG tablet Commonly known as: AUGMENTIN   aspirin EC 81 MG tablet   oxyCODONE-acetaminophen 5-325 MG tablet Commonly known as: PERCOCET/ROXICET       TAKE these medications    acetaminophen 500 MG tablet Commonly known as: TYLENOL Take 1 tablet (500 mg total) by mouth every 6 (six) hours as needed.   AMBULATORY NON FORMULARY MEDICATION Medication Name: Rollator   AMBULATORY NON FORMULARY MEDICATION Motorized scooter for use daily   AMBULATORY NON FORMULARY MEDICATION Shower seat for use daily   apixaban 5 MG Tabs tablet Commonly known as: ELIQUIS Take 1 tablet (5 mg total) by mouth 2 (two) times daily.   flecainide 50 MG tablet Commonly known as: TAMBOCOR Take 50 mg by mouth 2 (two) times a day.   furosemide 40 MG tablet Commonly known as: LASIX Take 1 tablet (40 mg total) by mouth daily. Take 40 mg oral twice daily x7 days then 40 mg oral daily What changed:  medication strength how much to take additional instructions   gabapentin 600 MG tablet Commonly known as: NEURONTIN Take 2 tablets (1,200 mg total) by mouth at bedtime.   icosapent Ethyl 1 g capsule Commonly known as: VASCEPA Take 2 capsules (2 g total) by mouth 2 (two) times daily.   levothyroxine 75 MCG tablet Commonly known as: SYNTHROID Take 1 tablet (75 mcg total) by mouth daily with breakfast.   metoprolol succinate 50 MG 24 hr tablet Commonly known as: TOPROL-XL TAKE ONE TABLET BY MOUTH DAILY   multivitamin with minerals tablet Take 1 tablet by mouth daily.   ondansetron 8 MG disintegrating tablet Commonly known as: ZOFRAN-ODT Take 1 tablet (8 mg total) by mouth every 8 (eight) hours as needed for nausea.   oxyCODONE 5 MG  immediate release tablet Commonly known as: Oxy IR/ROXICODONE Take 1 tablet (5 mg total) by mouth every 6 (six) hours as needed for severe pain or moderate pain ('5mg'$  for moderate pain, '10mg'$  for severe pain).   pantoprazole 40 MG tablet Commonly known as: PROTONIX TAKE ONE TABLET BY MOUTH DAILY   potassium chloride 10 MEQ tablet Commonly known as: KLOR-CON M Take 2 tablets (20 mEq total) by mouth daily.   ramelteon 8 MG tablet Commonly known as: Rozerem 1 tablet p.o. at [facility]   traZODone 100 MG tablet Commonly known as: DESYREL Take 1 tablet (100 mg total) by mouth at bedtime.   valsartan-hydrochlorothiazide 320-25 MG tablet Commonly known as: DIOVAN-HCT Take 0.5 tablets by mouth daily.   venlafaxine XR 75 MG 24 hr capsule Commonly known as: EFFEXOR-XR Take 3 capsules (225 mg total) by mouth daily.               Durable Medical Equipment  (From admission, onward)           Start     Ordered   06/12/22 1422  For home use only DME 3 n 1  Once  06/12/22 1421   06/12/22 1421  For home use only DME Tub bench  Once        06/12/22 1421            Follow-up Information     Ralene Ok, MD Follow up.   Specialty: General Surgery Why: our office is scheduling you for post-operative follow up in 3-4 weeks, please call to confirm appointment date/time. Contact information: [address] [address] [phone]         Surgery, [place]. Go on 06/21/2022.   Specialty: General Surgery Why: For staple removal. call to confirm appointment date/time. Contact information: [place] [address] [phone]                No Known Allergies  Consultations: GI General Surgery Cardiology     Procedures/Studies: CT HEAD WO CONTRAST (5MM)  Result Date: 06/10/2022 CLINICAL DATA:  Headache, tension type headache. EXAM: CT HEAD WITHOUT CONTRAST TECHNIQUE: Contiguous axial images were  obtained from the base of the skull through the vertex without intravenous contrast. RADIATION DOSE REDUCTION: This exam was performed according to the departmental dose-optimization program which includes automated exposure control, adjustment of the mA and/or kV according to patient size and/or use of iterative reconstruction technique. COMPARISON:  CT head without contrast 04/07/2020 FINDINGS: Brain: No acute infarct, hemorrhage, or mass lesion is present. Heterogeneous hypoattenuation in the thalami are stable. Basal ganglia are intact. Insular ribbon is normal. Scattered white matter hypoattenuation in the corona radiata demonstrates slight progression. No focal cortical abnormalities are present. The ventricles are of normal size. No significant extraaxial fluid collection is present. The brainstem and cerebellum are within normal limits. Vascular: Atherosclerotic calcifications are present in the cavernous internal carotid arteries bilaterally. No hyperdense vessel present. Skull: Calvarium is intact. No focal lytic or blastic lesions are present. No significant extracranial soft tissue lesion is present. Sinuses/Orbits: The paranasal sinuses and mastoid air cells are clear. Right lens replacement noted. Globes and orbits are otherwise within normal limits. IMPRESSION: 1. No acute intracranial abnormality or significant interval change. 2. Heterogeneous hypoattenuation in the thalami bilaterally is stable. This represents remote ischemic insults. 3. Slight progression of white matter disease. This likely reflects the sequela of chronic microvascular ischemia. Electronically Signed   By: San Morelle M.D.   On: 06/10/2022 19:54   ECHOCARDIOGRAM COMPLETE  Result Date: 06/05/2022    ECHOCARDIOGRAM REPORT   Patient Name:   [name] Date of Exam: 06/04/2022 Medical Rec #:  [id number]    Height:       63.0 in Accession #:    [id number]   Weight:       245.1 lb Date of Birth:  09-24-1951   BSA:           2.108 m Patient Age:    52 years     BP:           125/61 mmHg Patient Gender: F            HR:           57 bpm. Exam Location:  Inpatient Procedure: 2D Echo, Cardiac Doppler, Color Doppler and Intracardiac            Opacification Agent Indications:    Dyspnea  History:        Patient has no prior history of Echocardiogram examinations.  Arrythmias:Atrial Fibrillation; Risk Factors:Hypertension and                 Dyslipidemia. GERD. AKI.  Sonographer:    Clayton Lefort RDCS (AE) Referring Phys: [facility]  Sonographer Comments: Patient is morbidly obese. Image acquisition challenging due to patient body habitus. IMPRESSIONS  1. Left ventricular ejection fraction, by estimation, is 60 to 65%. The left ventricle has normal function. The left ventricle has no regional wall motion abnormalities. There is mild left ventricular hypertrophy. Left ventricular diastolic parameters were normal. There is the interventricular septum is flattened in diastole ('D' shaped left ventricle), consistent with right ventricular volume overload.  2. Right ventricular systolic function is normal. The right ventricular size is moderately enlarged. There is mildly elevated pulmonary artery systolic pressure. The estimated right ventricular systolic pressure is 99.3 mmHg.  3. The mitral valve is normal in structure. Mild mitral valve regurgitation. No evidence of mitral stenosis.  4. Tricuspid valve regurgitation is moderate.  5. The aortic valve is grossly normal. There is mild calcification of the aortic valve. Aortic valve regurgitation is not visualized. No aortic stenosis is present.  6. The inferior vena cava is dilated in size with <50% respiratory variability, suggesting right atrial pressure of 15 mmHg. FINDINGS  Left Ventricle: Left ventricular ejection fraction, by estimation, is 60 to 65%. The left ventricle has normal function. The left ventricle has no regional wall motion abnormalities. Definity contrast  agent was given IV to delineate the left ventricular  endocardial borders. The left ventricular internal cavity size was normal in size. There is mild left ventricular hypertrophy. The interventricular septum is flattened in diastole ('D' shaped left ventricle), consistent with right ventricular volume overload. Left ventricular diastolic parameters were normal. Right Ventricle: The right ventricular size is moderately enlarged. No increase in right ventricular wall thickness. Right ventricular systolic function is normal. There is mildly elevated pulmonary artery systolic pressure. The tricuspid regurgitant velocity is 2.72 m/s, and with an assumed right atrial pressure of 15 mmHg, the estimated right ventricular systolic pressure is 71.6 mmHg. Left Atrium: Left atrial size was normal in size. Right Atrium: Right atrial size was normal in size. Pericardium: Trivial pericardial effusion is present. Mitral Valve: The mitral valve is normal in structure. Mild mitral valve regurgitation. No evidence of mitral valve stenosis. Tricuspid Valve: The tricuspid valve is normal in structure. Tricuspid valve regurgitation is moderate . No evidence of tricuspid stenosis. Aortic Valve: The aortic valve is grossly normal. There is mild calcification of the aortic valve. Aortic valve regurgitation is not visualized. No aortic stenosis is present. Aortic valve mean gradient measures 4.0 mmHg. Aortic valve peak gradient measures 8.8 mmHg. Aortic valve area, by VTI measures 2.10 cm. Pulmonic Valve: The pulmonic valve was normal in structure. Pulmonic valve regurgitation is trivial. No evidence of pulmonic stenosis. Aorta: The aortic root is normal in size and structure. Venous: The inferior vena cava is dilated in size with less than 50% respiratory variability, suggesting right atrial pressure of 15 mmHg. IAS/Shunts: The interatrial septum was not well visualized.  LEFT VENTRICLE PLAX 2D LVIDd:         4.10 cm   Diastology LVIDs:          2.50 cm   LV e' medial:    8.16 cm/s LV PW:         1.20 cm   LV E/e' medial:  14.7 LV IVS:        1.10 cm  LV e' lateral:   8.27 cm/s LVOT diam:     2.00 cm   LV E/e' lateral: 14.5 LV SV:         69 LV SV Index:   33 LVOT Area:     3.14 cm  RIGHT VENTRICLE             IVC RV Basal diam:  3.40 cm     IVC diam: 2.20 cm RV S prime:     11.60 cm/s TAPSE (M-mode): 2.4 cm LEFT ATRIUM             Index        RIGHT ATRIUM           Index LA diam:        3.70 cm 1.75 cm/m   RA Area:     17.80 cm LA Vol (A2C):   48.7 ml 23.10 ml/m  RA Volume:   50.90 ml  24.14 ml/m LA Vol (A4C):   72.7 ml 34.48 ml/m LA Biplane Vol: 66.0 ml 31.30 ml/m  AORTIC VALVE AV Area (Vmax):    2.08 cm AV Area (Vmean):   2.05 cm AV Area (VTI):     2.10 cm AV Vmax:           148.00 cm/s AV Vmean:          94.400 cm/s AV VTI:            0.327 m AV Peak Grad:      8.8 mmHg AV Mean Grad:      4.0 mmHg LVOT Vmax:         98.00 cm/s LVOT Vmean:        61.600 cm/s LVOT VTI:          0.219 m LVOT/AV VTI ratio: 0.67  AORTA Ao Root diam: 3.20 cm Ao Asc diam:  3.50 cm MITRAL VALVE                TRICUSPID VALVE MV Area (PHT): 2.62 cm     TR Peak grad:   29.6 mmHg MV Decel Time: 290 msec     TR Vmax:        272.00 cm/s MV E velocity: 120.00 cm/s MV A velocity: 40.00 cm/s   SHUNTS MV E/A ratio:  3.00         Systemic VTI:  0.22 m                             Systemic Diam: 2.00 cm Cherlynn Kaiser MD Electronically signed by Cherlynn Kaiser MD Signature Date/Time: 06/05/2022/6:42:10 AM    Final    CT CHEST W CONTRAST  Result Date: 06/03/2022 CLINICAL DATA:  CT chest for staging colon cancer EXAM: CT CHEST WITH CONTRAST TECHNIQUE: Multidetector CT imaging of the chest was performed during intravenous contrast administration. RADIATION DOSE REDUCTION: This exam was performed according to the departmental dose-optimization program which includes automated exposure control, adjustment of the mA and/or kV according to patient size and/or use of  iterative reconstruction technique. CONTRAST:  155m OMNIPAQUE IOHEXOL 300 MG/ML  SOLN COMPARISON:  None Available. FINDINGS: Cardiovascular: Cardiomegaly. No pericardial effusion. Coronary artery and aortic atherosclerotic calcification. Mediastinum/Nodes: Subcentimeter precarinal nodes are favored reactive. Normal esophagus. Central airways patent. Lungs/Pleura: Small bilateral pleural effusions and associated atelectasis. Mild diffuse bronchial wall thickening. Patchy air trapping in the upper lungs. Upper Abdomen: Hepatic steatosis. No acute abnormality in the upper abdomen.  Musculoskeletal: No chest wall abnormality. No acute or significant osseous findings. IMPRESSION: 1. Small bilateral pleural effusions and associated atelectasis. 2. No evidence of metastatic disease in the chest. Electronically Signed   By: Placido Sou M.D.   On: 06/03/2022 21:53   CT ABDOMEN PELVIS W CONTRAST  Result Date: 05/30/2022 CLINICAL DATA:  Diverticulitis. Complication suspected. Patient treated for presumed infectious colitis and pneumatosis earlier this month. Ongoing abdominal pain. Latest CT angio 05/26/2022 demonstrated no bleeding but there were diverticula with bowel wall thickening and pericolonic fat stranding at the ascending colon/hepatic flexure suspicious for developing or resolving diverticulitis. EXAM: CT ABDOMEN AND PELVIS WITH CONTRAST TECHNIQUE: Multidetector CT imaging of the abdomen and pelvis was performed using the standard protocol following bolus administration of intravenous contrast. RADIATION DOSE REDUCTION: This exam was performed according to the departmental dose-optimization program which includes automated exposure control, adjustment of the mA and/or kV according to patient size and/or use of iterative reconstruction technique. CONTRAST:  130m OMNIPAQUE IOHEXOL 300 MG/ML  SOLN COMPARISON:  KUB 05/29/2022; CTA abdomen and pelvis FINDINGS: Lower chest: There are partially visualized  mild-to-moderate right-greater-than-left pleural effusions that are similar to prior. Curvilinear bilateral lower lung subsegmental atelectasis versus scarring. Heart size is again moderately enlarged. Hepatobiliary: Smooth liver contours. Diffuse decreased density is again seen throughout the liver suggesting fatty infiltration. No focal liver mass is identified. The gallbladder is unremarkable. No intrahepatic or extrahepatic biliary ductal dilatation. Pancreas: No mass or inflammatory fat stranding. No pancreatic ductal dilatation is seen. Spleen: Normal in size without focal abnormality. Adrenals/Urinary Tract: Adrenal glands are unremarkable. The kidneys enhance uniformly and are symmetric in size without hydronephrosis. No renal stone is seen. No renal mass is seen. No focal urinary bladder wall thickening. Stomach/Bowel: Mild to moderate sigmoid and mild descending colon diverticulosis without inflammatory changes. There is again mild inflammatory wall thickening of the ascending colon diffusely. This appears minimally worsened compared to recent 05/26/2022 CT. No perforation is seen. The terminal ileum is unremarkable. Normal appendix. No dilated loops of bowel to indicate bowel obstruction. Vascular/Lymphatic: No abdominal aortic aneurysm. Moderate atherosclerotic calcifications. No mesenteric, retroperitoneal, or pelvic lymphadenopathy. Reproductive: The uterus is present.  No gross adnexal abnormality. Other: Small fat containing umbilical hernia. Moderate edema throughout the deep aspect of the abdominal and proximal thigh subcutaneous fat diffusely, similar to prior. There are again hernia mesh coils within the anterior inferior pelvis.No abdominopelvic ascites. No pneumoperitoneum. Musculoskeletal: No acute or significant osseous findings. IMPRESSION: Compared to 05/26/2022: 1. Mild-to-moderate right-greater-than-left pleural effusions. 2. Unchanged to minimally worsened inflammatory wall thickening of  the ascending colon diffusely. This is again suggestive of colitis. 3. Mild-to-moderate sigmoid and mild descending colon diverticulosis without inflammatory changes to indicate acute diverticulitis. Electronically Signed   By: RYvonne KendallM.D.   On: 05/30/2022 17:27   DG Abd 2 Views  Result Date: 05/29/2022 CLINICAL DATA:  Abdominal distention and abdominal pain. EXAM: ABDOMEN - 2 VIEW COMPARISON:  Abdominal radiograph 05/28/2022. FINDINGS: The bowel gas pattern is normal. There is no evidence of free air given the limits of the supine images. No radio-opaque calculi or other significant radiographic abnormality is seen in the abdomen. Surgical tacks noted in the lower pelvis bilaterally. Bilateral small pleural effusions with associated atelectasis. IMPRESSION: Nonobstructive bowel gas pattern. Persistent small bilateral pleural effusions with bibasilar subsegmental atelectasis. Electronically Signed   By: LIleana RoupM.D.   On: 05/29/2022 17:52   DG Abd 2 Views  Result Date: 05/28/2022 CLINICAL DATA:  [id number] abdominal pain and blood in the stool EXAM: ABDOMEN - 2 VIEW COMPARISON:  None Available. FINDINGS: The bowel gas pattern is normal. There is no evidence of free air. No significant stool burden in the colon. No radio-opaque calculi or other significant radiographic abnormality is seen. Bilateral small pleural effusion. IMPRESSION: Bowel-gas pattern is nonobstructive.  Bilateral pleural effusion. Electronically Signed   By: Frazier Richards M.D.   On: 05/28/2022 13:44   CT ANGIO GI BLEED  Result Date: 05/26/2022 CLINICAL DATA:  GI bleed, lower Right sided abdominal pain; GI bleeding, but no active bleeding--none since Monday EXAM: CTA ABDOMEN AND PELVIS WITHOUT AND WITH CONTRAST TECHNIQUE: Multidetector CT imaging of the abdomen and pelvis was performed using the standard protocol during bolus administration of intravenous contrast. Multiplanar reconstructed images and MIPs were obtained and  reviewed to evaluate the vascular anatomy. RADIATION DOSE REDUCTION: This exam was performed according to the departmental dose-optimization program which includes automated exposure control, adjustment of the mA and/or kV according to patient size and/or use of iterative reconstruction technique. CONTRAST:  149m OMNIPAQUE IOHEXOL 350 MG/ML SOLN COMPARISON:  None Available. FINDINGS: VASCULAR No active hemorrhage noted. Aorta: Moderate to severe atherosclerotic plaque. Normal caliber aorta without aneurysm, dissection, vasculitis or significant stenosis. Celiac: Patent without evidence of aneurysm, dissection, vasculitis or significant stenosis. SMA: Patent without evidence of aneurysm, dissection, vasculitis or significant stenosis. Renals: Both renal arteries are patent without evidence of aneurysm, dissection, vasculitis, fibromuscular dysplasia or significant stenosis. IMA: Patent without evidence of aneurysm, dissection, vasculitis or significant stenosis. Inflow: Patent without evidence of aneurysm, dissection, vasculitis or significant stenosis. Proximal Outflow: Bilateral common femoral and visualized portions of the superficial and profunda femoral arteries are patent without evidence of aneurysm, dissection, vasculitis or significant stenosis. Veins: The portal vein, splenic, superior mesenteric veins are patent. Review of the MIP images confirms the above findings. NON-VASCULAR Lower chest: Passive atelectasis of bilateral lower lobes. Trace to small right and trace left pleural effusion. Tiny hiatal hernia. Hepatobiliary: No focal liver abnormality. No gallstones, gallbladder wall thickening, or pericholecystic fluid. No biliary dilatation. Pancreas: No focal lesion. Normal pancreatic contour. No surrounding inflammatory changes. No main pancreatic ductal dilatation. Spleen: Normal in size without focal abnormality. Adrenals/Urinary Tract: No adrenal nodule bilaterally. Bilateral kidneys enhance  symmetrically. No hydronephrosis. No hydroureter. The urinary bladder is unremarkable. Stomach/Bowel: Stomach is within normal limits. No evidence of small bowel wall thickening or dilatation. Scattered colonic diverticulosis. There is a ascending colon/hepatic flexure diverticula with associated trace bowel thickening and mild surrounding pericolonic fat stranding. Appendix appears normal. Lymphatic: No lymphadenopathy. Reproductive: Uterus and bilateral adnexa are unremarkable. Other: No intraperitoneal free fluid. No intraperitoneal free gas. No organized fluid collection. Musculoskeletal: Diffuse subcutaneus soft tissue edema. No hernia. Likely bilateral inguinal hernia repairs given tacks noted. No suspicious lytic or blastic osseous lesions. No acute displaced fracture. Multilevel degenerative changes of the spine. IMPRESSION: VASCULAR 1. No active hemorrhage. 2.  Aortic Atherosclerosis (ICD10-I70.0). NON-VASCULAR 1. Colonic diverticulosis with developing or resolving uncomplicated mild acute diverticulitis of the ascending colon/hepatic flexure. Recommend colonoscopy status post treatment and status post complete resolution of inflammatory changes to exclude an underlying lesion. 2. Bilateral trace to small volume, right greater than left, pleural effusions. 3. Tiny hiatal hernia. Electronically Signed   By: MIven FinnM.D.   On: 05/26/2022 17:11   Procedures:  -EGD -Colonoscopy -S/P LAPAROSCOPIC ASSISTED RIGHT COLECTOMY 06/07/22 Dr. 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    Subjective:  No chest pain, no shortness of breath, abdominal pain is controlled, no headache  Discharge Exam: Vitals:   06/13/22 0500 06/13/22 0808  BP: (!) 164/80 (!) 168/84  Pulse: 64 65  Resp: 14 14  Temp: 98.1 F (36.7 C) 97.9 F (36.6 C)  SpO2: 95% 95%   Vitals:   06/12/22 2000 06/13/22 0000 06/13/22 0500 06/13/22 0808  BP: (!) 177/84 (!) 171/83 (!) 164/80 (!) 168/84  Pulse:  62 61 64 65  Resp: '17 15 14 14  '$ Temp: 98.1 F (36.7 C) 98 F (36.7 C) 98.1 F (36.7 C) 97.9 F (36.6 C)  TempSrc:  Oral Oral Axillary  SpO2: 95% 93% 95% 95%  Weight:      Height:        General: Pt is alert, awake, not in acute distress Cardiovascular: RRR, S1/S2 +, no rubs, no gallops Respiratory: CTA bilaterally, no wheezing, no rhonchi Abdominal: Soft, NT, ND, bowel sounds +, abdominal wound covered with honeycomb mesh Extremities: +1edema, no cyanosis    The results of significant diagnostics from this hospitalization (including imaging, microbiology, ancillary and laboratory) are listed below for reference.     Microbiology: Recent Results (from the past 240 hour(s))  Surgical pcr screen     Status: None   Collection Time: 06/07/22  3:45 AM   Specimen: Nasal Mucosa; Nasal Swab  Result Value Ref Range Status   MRSA, PCR NEGATIVE NEGATIVE Final   Staphylococcus aureus NEGATIVE NEGATIVE Final    Comment: (NOTE) The Xpert SA Assay (FDA approved for NASAL specimens in patients 78 years of age and older), is one component of a comprehensive surveillance program. It is not intended to diagnose infection nor to guide or monitor treatment. Performed at [facility], [place] [address], [place], [place] [address]      Labs: BNP (last 3 results) No results for input(s): "BNP" in the last 8760 hours. Basic Metabolic Panel: Recent Labs  Lab 06/08/22 2153 06/09/22 0549 06/10/22 0250 06/11/22 0215 06/12/22 0154 06/13/22 0521  NA 137 137 137 143 143 141  K 3.3* 3.8 3.5 3.7 3.8 3.1*  CL 101 102 104 104 102 97*  CO2 '29 29 28 '$ 34* 35* 34*  GLUCOSE 128* 106* 107* 101* 130* 98  BUN '11 11 9 '$ 6* 8 5*  CREATININE 1.34* 1.36* 0.90 0.76 0.73 0.66  CALCIUM 8.1* 8.2* 8.0* 8.2* 8.5* 8.7*  MG 1.8 1.8  --   --   --  1.7  PHOS  --  3.1  --   --   --   --    Liver Function Tests: Recent Labs  Lab 06/08/22 2153  AST 28  ALT 17  ALKPHOS 70  BILITOT 0.7  PROT 4.5*  ALBUMIN  2.1*   No results for input(s): "LIPASE", "AMYLASE" in the last 168 hours. No results for input(s): "AMMONIA" in the last 168 hours. CBC: Recent Labs  Lab 06/09/22 0549 06/10/22 0250 06/11/22 0215 06/12/22 0154 06/13/22 0521  WBC 7.2 5.5 4.3 4.1 3.8*  HGB 8.0* 8.9* 9.0* 9.1* 9.1*  HCT 26.1* 28.5* 28.7* 29.3* 29.7*  MCV 100.8* 96.9 97.3 96.1 96.1  PLT 187 175 193 206 204   Cardiac Enzymes: No results for input(s): "CKTOTAL", "CKMB", "CKMBINDEX", "TROPONINI" in the last 168 hours. BNP: Invalid input(s): "POCBNP" CBG: Recent Labs  Lab 06/08/22 1745  GLUCAP 126*   D-Dimer No results for input(s): "DDIMER" in the last 72 hours. Hgb A1c No results for input(s): "HGBA1C" in the last  72 hours. Lipid Profile No results for input(s): "CHOL", "HDL", "LDLCALC", "TRIG", "CHOLHDL", "LDLDIRECT" in the last 72 hours. Thyroid function studies No results for input(s): "TSH", "T4TOTAL", "T3FREE", "THYROIDAB" in the last 72 hours.  Invalid input(s): "FREET3" Anemia work up No results for input(s): "VITAMINB12", "FOLATE", "FERRITIN", "TIBC", "IRON", "RETICCTPCT" in the last 72 hours. Urinalysis    Component Value Date/Time   COLORURINE YELLOW 05/27/2022 1030   APPEARANCEUR CLEAR 05/27/2022 1030   LABSPEC 1.019 05/27/2022 1030   PHURINE 5.0 05/27/2022 1030   GLUCOSEU NEGATIVE 05/27/2022 1030   HGBUR NEGATIVE 05/27/2022 1030   BILIRUBINUR NEGATIVE 05/27/2022 1030   BILIRUBINUR negative 03/09/2021 1554   KETONESUR 5 (A) 05/27/2022 1030   PROTEINUR NEGATIVE 05/27/2022 1030   UROBILINOGEN 0.2 03/09/2021 1554   NITRITE NEGATIVE 05/27/2022 1030   LEUKOCYTESUR NEGATIVE 05/27/2022 1030   Sepsis Labs Recent Labs  Lab 06/10/22 0250 06/11/22 0215 06/12/22 0154 06/13/22 0521  WBC 5.5 4.3 4.1 3.8*   Microbiology Recent Results (from the past 240 hour(s))  Surgical pcr screen     Status: None   Collection Time: 06/07/22  3:45 AM   Specimen: Nasal Mucosa; Nasal Swab  Result Value  Ref Range Status   MRSA, PCR NEGATIVE NEGATIVE Final   Staphylococcus aureus NEGATIVE NEGATIVE Final    Comment: (NOTE) The Xpert SA Assay (FDA approved for NASAL specimens in patients 67 years of age and older), is one component of a comprehensive surveillance program. It is not intended to diagnose infection nor to guide or monitor treatment. Performed at [facility], [place] [address], [place], [place] [address]      Time coordinating discharge: Over 30 minutes  SIGNED:   Phillips Climes, MD  [facility] 06/13/2022, 10:46 AM Pager   If 7PM-7AM, please contact night-coverage [email or web address]

## 2022-06-14 ENCOUNTER — Telehealth: Payer: Self-pay | Admitting: *Deleted

## 2022-06-14 ENCOUNTER — Encounter: Payer: Self-pay | Admitting: *Deleted

## 2022-06-14 NOTE — Patient Outreach (Signed)
  Care Coordination TOC Note Transition Care Management Follow-up Telephone Call Date of discharge and from where: 06/13/22 How have you been since you were released from the hospital? "It was a rough night; I stayed up all night peeing because they increased my fluid pill dose; I am having a hard time, I didn't get any sleep at all" Any questions or concerns? Yes- excessive urination due to increased dose of diuretic- she is afraid she will become dehydrated and her blood pressure will drop because "that's what happend last time they increased it"  Items Reviewed: Did the pt receive and understand the discharge instructions provided? Yes  Medications obtained and verified? Yes  Other? No  Any new allergies since your discharge? No  Dietary orders reviewed? Yes Do you have support at home? Yes   Home Care and Equipment/Supplies: Were home health services ordered? yes If so, what is the name of the agency? [facility]  Has the agency set up a time to come to the patient's home? "Not yet" Were any new equipment or medical supplies ordered?  Yes: walker, BSC, shower seat- obtained What is the name of the medical supply agency? [employer] Were you able to get the supplies/equipment? yes Do you have any questions related to the use of the equipment or supplies? No  Functional Questionnaire: (I = Independent and D = Dependent) ADLs: I son assisting as indicated  Bathing/Dressing- I son assisting as indicated  Meal Prep- I  son assisting as indicated  Eating- I  Maintaining continence- I  son assisting as indicated  Transferring/Ambulation- I  son assisting as indicated  Managing Meds- I  son assisting as indicated  Follow up appointments reviewed:  PCP Hospital f/u appt confirmed? No  Scheduled to see - on - @ [facility] f/u appt confirmed? Yes  Scheduled to see Dr. Rosendo Gros on 06/21/22 @ "not sure;" to call to determine time Are transportation arrangements needed?  No  If their condition worsens, is the pt aware to call PCP or go to the Emergency Dept.? Yes Was the patient provided with contact information for the PCP's office or ED? Yes Was to pt encouraged to call back with questions or concerns? Yes  SDOH assessments and interventions completed:   Yes  Care Coordination Interventions Activated:  Yes   Care Coordination Interventions:   facilitated post-hospital PCP visit; scheduled for RN CM care coordination follow up; provided education around use of diuretic     Encounter Outcome:  Pt. Visit Completed    Oneta Rack, RN, BSN, [place] RN [place] Management [phone]: direct office

## 2022-06-15 ENCOUNTER — Telehealth: Payer: Self-pay

## 2022-06-15 NOTE — Telephone Encounter (Signed)
Approval to stop furosemide, she should come in for vitals sometime later this week.

## 2022-06-15 NOTE — Telephone Encounter (Signed)
Patient aware of the ok to stop the furosemide and call was transferred to the front desk for scheduling of a nurse visit for vitals.

## 2022-06-15 NOTE — Telephone Encounter (Signed)
Patient is requesting to stop taking the lasix that was started in the hospital. She states that it's "like a river" and she cannot make it to the bathroom or potty seat. She reports she did not take it yesterday and had no accidents. Her weight is holding at 221. Please advise.

## 2022-06-16 DIAGNOSIS — H8110 Benign paroxysmal vertigo, unspecified ear: Secondary | ICD-10-CM | POA: Diagnosis not present

## 2022-06-16 DIAGNOSIS — L400 Psoriasis vulgaris: Secondary | ICD-10-CM | POA: Diagnosis not present

## 2022-06-16 DIAGNOSIS — D62 Acute posthemorrhagic anemia: Secondary | ICD-10-CM | POA: Diagnosis not present

## 2022-06-16 DIAGNOSIS — I5033 Acute on chronic diastolic (congestive) heart failure: Secondary | ICD-10-CM | POA: Diagnosis not present

## 2022-06-16 DIAGNOSIS — F32A Depression, unspecified: Secondary | ICD-10-CM | POA: Diagnosis not present

## 2022-06-16 DIAGNOSIS — K219 Gastro-esophageal reflux disease without esophagitis: Secondary | ICD-10-CM | POA: Diagnosis not present

## 2022-06-16 DIAGNOSIS — E876 Hypokalemia: Secondary | ICD-10-CM | POA: Diagnosis not present

## 2022-06-16 DIAGNOSIS — R5383 Other fatigue: Secondary | ICD-10-CM | POA: Diagnosis not present

## 2022-06-16 DIAGNOSIS — N179 Acute kidney failure, unspecified: Secondary | ICD-10-CM | POA: Diagnosis not present

## 2022-06-16 DIAGNOSIS — I480 Paroxysmal atrial fibrillation: Secondary | ICD-10-CM | POA: Diagnosis not present

## 2022-06-16 DIAGNOSIS — E039 Hypothyroidism, unspecified: Secondary | ICD-10-CM | POA: Diagnosis not present

## 2022-06-16 DIAGNOSIS — R262 Difficulty in walking, not elsewhere classified: Secondary | ICD-10-CM | POA: Diagnosis not present

## 2022-06-16 DIAGNOSIS — Z7901 Long term (current) use of anticoagulants: Secondary | ICD-10-CM | POA: Diagnosis not present

## 2022-06-16 DIAGNOSIS — K559 Vascular disorder of intestine, unspecified: Secondary | ICD-10-CM | POA: Diagnosis not present

## 2022-06-16 DIAGNOSIS — I951 Orthostatic hypotension: Secondary | ICD-10-CM | POA: Diagnosis not present

## 2022-06-16 DIAGNOSIS — K922 Gastrointestinal hemorrhage, unspecified: Secondary | ICD-10-CM | POA: Diagnosis not present

## 2022-06-16 DIAGNOSIS — Z9049 Acquired absence of other specified parts of digestive tract: Secondary | ICD-10-CM | POA: Diagnosis not present

## 2022-06-16 DIAGNOSIS — E785 Hyperlipidemia, unspecified: Secondary | ICD-10-CM | POA: Diagnosis not present

## 2022-06-16 DIAGNOSIS — I110 Hypertensive heart disease with heart failure: Secondary | ICD-10-CM | POA: Diagnosis not present

## 2022-06-16 DIAGNOSIS — R339 Retention of urine, unspecified: Secondary | ICD-10-CM | POA: Diagnosis not present

## 2022-06-17 ENCOUNTER — Emergency Department (HOSPITAL_COMMUNITY): Payer: Medicare Other

## 2022-06-17 ENCOUNTER — Observation Stay (HOSPITAL_COMMUNITY)
Admission: EM | Admit: 2022-06-17 | Discharge: 2022-06-21 | Disposition: A | Discharge status: Home Health | Payer: Medicare Other | Attending: Internal Medicine | Admitting: Internal Medicine

## 2022-06-17 ENCOUNTER — Telehealth: Payer: Self-pay | Admitting: Sports Medicine

## 2022-06-17 ENCOUNTER — Encounter (HOSPITAL_COMMUNITY): Payer: Self-pay | Admitting: Internal Medicine

## 2022-06-17 ENCOUNTER — Telehealth: Payer: Self-pay

## 2022-06-17 ENCOUNTER — Ambulatory Visit: Payer: Medicare Other

## 2022-06-17 ENCOUNTER — Other Ambulatory Visit: Payer: Self-pay

## 2022-06-17 DIAGNOSIS — R197 Diarrhea, unspecified: Secondary | ICD-10-CM | POA: Diagnosis not present

## 2022-06-17 DIAGNOSIS — I5033 Acute on chronic diastolic (congestive) heart failure: Secondary | ICD-10-CM | POA: Diagnosis not present

## 2022-06-17 DIAGNOSIS — H8110 Benign paroxysmal vertigo, unspecified ear: Secondary | ICD-10-CM | POA: Insufficient documentation

## 2022-06-17 DIAGNOSIS — M48061 Spinal stenosis, lumbar region without neurogenic claudication: Secondary | ICD-10-CM

## 2022-06-17 DIAGNOSIS — I110 Hypertensive heart disease with heart failure: Secondary | ICD-10-CM | POA: Insufficient documentation

## 2022-06-17 DIAGNOSIS — Z79899 Other long term (current) drug therapy: Secondary | ICD-10-CM | POA: Insufficient documentation

## 2022-06-17 DIAGNOSIS — Z9049 Acquired absence of other specified parts of digestive tract: Secondary | ICD-10-CM | POA: Diagnosis not present

## 2022-06-17 DIAGNOSIS — J811 Chronic pulmonary edema: Secondary | ICD-10-CM | POA: Insufficient documentation

## 2022-06-17 DIAGNOSIS — Z7901 Long term (current) use of anticoagulants: Secondary | ICD-10-CM | POA: Insufficient documentation

## 2022-06-17 DIAGNOSIS — R1111 Vomiting without nausea: Secondary | ICD-10-CM | POA: Diagnosis not present

## 2022-06-17 DIAGNOSIS — J810 Acute pulmonary edema: Secondary | ICD-10-CM

## 2022-06-17 DIAGNOSIS — Z743 Need for continuous supervision: Secondary | ICD-10-CM | POA: Diagnosis not present

## 2022-06-17 DIAGNOSIS — R1110 Vomiting, unspecified: Secondary | ICD-10-CM | POA: Diagnosis not present

## 2022-06-17 DIAGNOSIS — I480 Paroxysmal atrial fibrillation: Secondary | ICD-10-CM | POA: Diagnosis not present

## 2022-06-17 DIAGNOSIS — Z20822 Contact with and (suspected) exposure to covid-19: Secondary | ICD-10-CM | POA: Diagnosis not present

## 2022-06-17 DIAGNOSIS — R1084 Generalized abdominal pain: Secondary | ICD-10-CM | POA: Insufficient documentation

## 2022-06-17 DIAGNOSIS — R0602 Shortness of breath: Secondary | ICD-10-CM | POA: Diagnosis not present

## 2022-06-17 DIAGNOSIS — I509 Heart failure, unspecified: Secondary | ICD-10-CM

## 2022-06-17 DIAGNOSIS — R079 Chest pain, unspecified: Secondary | ICD-10-CM | POA: Diagnosis not present

## 2022-06-17 DIAGNOSIS — E039 Hypothyroidism, unspecified: Secondary | ICD-10-CM | POA: Diagnosis present

## 2022-06-17 DIAGNOSIS — R112 Nausea with vomiting, unspecified: Secondary | ICD-10-CM | POA: Diagnosis not present

## 2022-06-17 DIAGNOSIS — I10 Essential (primary) hypertension: Secondary | ICD-10-CM | POA: Diagnosis not present

## 2022-06-17 DIAGNOSIS — R6889 Other general symptoms and signs: Secondary | ICD-10-CM | POA: Diagnosis not present

## 2022-06-17 DIAGNOSIS — R5381 Other malaise: Secondary | ICD-10-CM | POA: Diagnosis not present

## 2022-06-17 DIAGNOSIS — R110 Nausea: Secondary | ICD-10-CM | POA: Diagnosis not present

## 2022-06-17 DIAGNOSIS — D62 Acute posthemorrhagic anemia: Secondary | ICD-10-CM | POA: Insufficient documentation

## 2022-06-17 DIAGNOSIS — I5032 Chronic diastolic (congestive) heart failure: Secondary | ICD-10-CM | POA: Diagnosis present

## 2022-06-17 LAB — CBC WITH DIFFERENTIAL/PLATELET
Abs Immature Granulocytes: 0.02 10*3/uL (ref 0.00–0.07)
Basophils Absolute: 0 10*3/uL (ref 0.0–0.1)
Basophils Relative: 0 %
Eosinophils Absolute: 0 10*3/uL (ref 0.0–0.5)
Eosinophils Relative: 0 %
HCT: 33.5 % — ABNORMAL LOW (ref 36.0–46.0)
Hemoglobin: 10.2 g/dL — ABNORMAL LOW (ref 12.0–15.0)
Immature Granulocytes: 0 %
Lymphocytes Relative: 10 %
Lymphs Abs: 0.5 10*3/uL — ABNORMAL LOW (ref 0.7–4.0)
MCH: 29.6 pg (ref 26.0–34.0)
MCHC: 30.4 g/dL (ref 30.0–36.0)
MCV: 97.1 fL (ref 80.0–100.0)
Monocytes Absolute: 0.3 10*3/uL (ref 0.1–1.0)
Monocytes Relative: 6 %
Neutro Abs: 4.4 10*3/uL (ref 1.7–7.7)
Neutrophils Relative %: 84 %
Platelets: 251 10*3/uL (ref 150–400)
RBC: 3.45 MIL/uL — ABNORMAL LOW (ref 3.87–5.11)
RDW: 16 % — ABNORMAL HIGH (ref 11.5–15.5)
WBC: 5.3 10*3/uL (ref 4.0–10.5)
nRBC: 0 % (ref 0.0–0.2)

## 2022-06-17 LAB — COMPREHENSIVE METABOLIC PANEL
ALT: 23 U/L (ref 0–44)
AST: 45 U/L — ABNORMAL HIGH (ref 15–41)
Albumin: 3.2 g/dL — ABNORMAL LOW (ref 3.5–5.0)
Alkaline Phosphatase: 87 U/L (ref 38–126)
Anion gap: 9 (ref 5–15)
BUN: 12 mg/dL (ref 8–23)
CO2: 25 mmol/L (ref 22–32)
Calcium: 9.2 mg/dL (ref 8.9–10.3)
Chloride: 106 mmol/L (ref 98–111)
Creatinine, Ser: 0.58 mg/dL (ref 0.44–1.00)
GFR, Estimated: >60 mL/min (ref >60)
Glucose, Bld: 155 mg/dL — ABNORMAL HIGH (ref 70–99)
Potassium: 4.2 mmol/L (ref 3.5–5.1)
Sodium: 140 mmol/L (ref 135–145)
Total Bilirubin: 0.5 mg/dL (ref 0.3–1.2)
Total Protein: 6.2 g/dL — ABNORMAL LOW (ref 6.5–8.1)

## 2022-06-17 LAB — I-STAT VENOUS BLOOD GAS, ED
Acid-Base Excess: 5 mmol/L — ABNORMAL HIGH (ref 0.0–2.0)
Bicarbonate: 30.2 mmol/L — ABNORMAL HIGH (ref 20.0–28.0)
Calcium, Ion: 1.17 mmol/L (ref 1.15–1.40)
HCT: 32 % — ABNORMAL LOW (ref 36.0–46.0)
Hemoglobin: 10.9 g/dL — ABNORMAL LOW (ref 12.0–15.0)
O2 Saturation: 74 %
Potassium: 5.1 mmol/L (ref 3.5–5.1)
Sodium: 138 mmol/L (ref 135–145)
TCO2: 32 mmol/L (ref 22–32)
pCO2, Ven: 47.8 mmHg (ref 44–60)
pH, Ven: 7.409 (ref 7.25–7.43)
pO2, Ven: 40 mmHg (ref 32–45)

## 2022-06-17 LAB — TROPONIN I (HIGH SENSITIVITY)
Troponin I (High Sensitivity): 6 ng/L (ref <18)
Troponin I (High Sensitivity): 7 ng/L (ref <18)

## 2022-06-17 LAB — SARS CORONAVIRUS 2 BY RT PCR: SARS Coronavirus 2 by RT PCR: NEGATIVE

## 2022-06-17 LAB — BRAIN NATRIURETIC PEPTIDE: B Natriuretic Peptide: 412 pg/mL — ABNORMAL HIGH (ref 0.0–100.0)

## 2022-06-17 MED ORDER — VALSARTAN-HYDROCHLOROTHIAZIDE 320-25 MG PO TABS: 0.5000 | ORAL_TABLET | Freq: Every day | ORAL | Status: DC

## 2022-06-17 MED ORDER — ACETAMINOPHEN 650 MG RE SUPP: 650.0000 mg | Freq: Four times a day (QID) | RECTAL | Status: DC | PRN

## 2022-06-17 MED ORDER — TRAZODONE HCL 100 MG PO TABS
100.0000 mg | ORAL_TABLET | Freq: Every day | ORAL | Status: DC
Start: 2022-06-17 — End: 2022-06-19
  Administered 2022-06-17: 100 mg via ORAL
  Filled 2022-06-17: qty 1

## 2022-06-17 MED ORDER — PROCHLORPERAZINE EDISYLATE 10 MG/2ML IJ SOLN
5.0000 mg | Freq: Once | INTRAMUSCULAR | Status: AC
Start: 2022-06-17 — End: 2022-06-17
  Administered 2022-06-17: 5 mg via INTRAVENOUS
  Filled 2022-06-17: qty 1

## 2022-06-17 MED ORDER — METOCLOPRAMIDE HCL 5 MG/ML IJ SOLN
10.0000 mg | Freq: Once | INTRAMUSCULAR | Status: AC
  Administered 2022-06-17: 10 mg via INTRAVENOUS
  Filled 2022-06-17: qty 2

## 2022-06-17 MED ORDER — APIXABAN 5 MG PO TABS
5.0000 mg | ORAL_TABLET | Freq: Two times a day (BID) | ORAL | Status: DC
  Administered 2022-06-17 – 2022-06-21 (×8): 5 mg via ORAL
  Filled 2022-06-17 (×8): qty 1

## 2022-06-17 MED ORDER — VENLAFAXINE HCL ER 75 MG PO CP24
225.0000 mg | ORAL_CAPSULE | Freq: Every day | ORAL | Status: DC
  Administered 2022-06-18 – 2022-06-21 (×4): 225 mg via ORAL
  Filled 2022-06-17 (×4): qty 3

## 2022-06-17 MED ORDER — PANTOPRAZOLE SODIUM 40 MG PO TBEC
40.0000 mg | DELAYED_RELEASE_TABLET | Freq: Every day | ORAL | Status: DC
  Administered 2022-06-18 – 2022-06-21 (×4): 40 mg via ORAL
  Filled 2022-06-17 (×4): qty 1

## 2022-06-17 MED ORDER — FUROSEMIDE 10 MG/ML IJ SOLN
40.0000 mg | Freq: Two times a day (BID) | INTRAMUSCULAR | Status: DC
  Administered 2022-06-17 – 2022-06-19 (×4): 40 mg via INTRAVENOUS
  Filled 2022-06-17 (×4): qty 4

## 2022-06-17 MED ORDER — SODIUM CHLORIDE 0.9 % IV BOLUS
500.0000 mL | Freq: Once | INTRAVENOUS | Status: AC
  Administered 2022-06-17: 500 mL via INTRAVENOUS

## 2022-06-17 MED ORDER — HYDROCHLOROTHIAZIDE 25 MG PO TABS
25.0000 mg | ORAL_TABLET | Freq: Every day | ORAL | Status: DC
  Administered 2022-06-18: 25 mg via ORAL
  Filled 2022-06-17: qty 1

## 2022-06-17 MED ORDER — IRBESARTAN 300 MG PO TABS
300.0000 mg | ORAL_TABLET | Freq: Every day | ORAL | Status: DC
  Administered 2022-06-18: 300 mg via ORAL
  Filled 2022-06-17: qty 1

## 2022-06-17 MED ORDER — GABAPENTIN 600 MG PO TABS
1200.0000 mg | ORAL_TABLET | Freq: Every day | ORAL | Status: DC
  Administered 2022-06-17: 1200 mg via ORAL
  Filled 2022-06-17: qty 2

## 2022-06-17 MED ORDER — RAMELTEON 8 MG PO TABS
8.0000 mg | ORAL_TABLET | Freq: Every day | ORAL | Status: DC
  Administered 2022-06-17 – 2022-06-20 (×4): 8 mg via ORAL
  Filled 2022-06-17 (×5): qty 1

## 2022-06-17 MED ORDER — ACETAMINOPHEN 325 MG PO TABS
650.0000 mg | ORAL_TABLET | Freq: Four times a day (QID) | ORAL | Status: DC | PRN
  Administered 2022-06-18 – 2022-06-20 (×2): 650 mg via ORAL
  Filled 2022-06-17 (×2): qty 2

## 2022-06-17 MED ORDER — ONDANSETRON HCL 4 MG/2ML IJ SOLN
4.0000 mg | Freq: Once | INTRAMUSCULAR | Status: AC
  Administered 2022-06-17: 4 mg via INTRAVENOUS
  Filled 2022-06-17: qty 2

## 2022-06-17 MED ORDER — SODIUM CHLORIDE 0.9 % IV BOLUS: 500.0000 mL | Freq: Once | INTRAVENOUS | Status: DC

## 2022-06-17 MED ORDER — IOHEXOL 300 MG/ML  SOLN
75.0000 mL | Freq: Once | INTRAMUSCULAR | Status: AC | PRN
  Administered 2022-06-17: 75 mL via INTRAVENOUS

## 2022-06-17 MED ORDER — METOPROLOL SUCCINATE ER 50 MG PO TB24
50.0000 mg | ORAL_TABLET | Freq: Every day | ORAL | Status: DC
  Administered 2022-06-18 – 2022-06-21 (×4): 50 mg via ORAL
  Filled 2022-06-17 (×5): qty 1

## 2022-06-17 MED ORDER — LEVOTHYROXINE SODIUM 75 MCG PO TABS
75.0000 ug | ORAL_TABLET | Freq: Every day | ORAL | Status: DC
  Administered 2022-06-18 – 2022-06-21 (×4): 75 ug via ORAL
  Filled 2022-06-17 (×4): qty 1

## 2022-06-17 MED ORDER — DIPHENHYDRAMINE HCL 50 MG/ML IJ SOLN
25.0000 mg | Freq: Once | INTRAMUSCULAR | Status: AC
  Administered 2022-06-17: 25 mg via INTRAVENOUS
  Filled 2022-06-17: qty 1

## 2022-06-17 MED ORDER — FLECAINIDE ACETATE 50 MG PO TABS
50.0000 mg | ORAL_TABLET | Freq: Two times a day (BID) | ORAL | Status: DC
  Administered 2022-06-17 – 2022-06-21 (×8): 50 mg via ORAL
  Filled 2022-06-17 (×9): qty 1

## 2022-06-17 NOTE — H&P (Signed)
History and Physical    [name] HDQ:[id number] DOB: 1951/04/07 DOA: 06/17/2022  PCP: Silverio Decamp, MD  Patient coming from: Home.  Chief Complaint: Nausea vomiting diarrhea and shortness of breath.  HPI: [name] is a 71 y.o. female with history of atrial fibrillation status post ablation, hypothyroidism who was recently admitted for ischemic colitis and during the work-up was found to have colon mass underwent hemicolectomy was discharged home about 5 days ago presents to the ER because of nausea vomiting diarrhea ongoing since this morning.  Denies any sick contacts.  Denies any blood in the vomitus or diarrhea.  Patient also was feeling short of breath.  Patient was discharged on Lasix which patient stopped taking about 4 days ago.  ED Course: In the ER CT abdomen pelvis does not show anything acute.  Does show some pleural effusion.  Chest x-ray shows features concerning for pulmonary edema.  Labs are largely unremarkable.  ER physician did discuss with on-call general surgeon who advised observation at this time.  Patient was given Lasix for the fluid overload.  Admitted for further observation  Review of Systems: As per HPI, rest all negative.   Past Medical History:  Diagnosis Date   Anxiety    Atrial fibrillation ([place])    Depression    GERD (gastroesophageal reflux disease)    Hypertension    Plaque psoriasis    Thyroid disease     Past Surgical History:  Procedure Laterality Date   BIOPSY  06/02/2022   Procedure: BIOPSY;  Surgeon: Daryel November, MD;  Location: [facility] ENDOSCOPY;  Service: Gastroenterology;;   COLONOSCOPY N/A 06/02/2022   Procedure: COLONOSCOPY;  Surgeon: Daryel November, MD;  Location: [place];  Service: Gastroenterology;  Laterality: N/A;   ESOPHAGOGASTRODUODENOSCOPY N/A 06/02/2022   Procedure: ESOPHAGOGASTRODUODENOSCOPY (EGD);  Surgeon: Daryel November, MD;  Location: [place];  Service: Gastroenterology;  Laterality: N/A;    LAPAROSCOPIC PARTIAL COLECTOMY N/A 06/07/2022   Procedure: LAPAROSCOPIC PARTIAL COLECTOMY; POSSIBLE OPEN PARTIAL COLECTOMY;  Surgeon: Ralene Ok, MD;  Location: [place];  Service: General;  Laterality: N/A;   ORIF ANKLE FRACTURE     POLYPECTOMY  06/02/2022   Procedure: POLYPECTOMY;  Surgeon: Daryel November, MD;  Location: [facility] ENDOSCOPY;  Service: Gastroenterology;;   SUBMUCOSAL TATTOO INJECTION  06/02/2022   Procedure: SUBMUCOSAL TATTOO INJECTION;  Surgeon: Daryel November, MD;  Location: [place];  Service: Gastroenterology;;     reports that she has never smoked. She has never used smokeless tobacco. She reports that she does not drink alcohol and does not use drugs.  No Known Allergies  Family History  Problem Relation Age of Onset   Hypertension Mother    Hypertension Father    Cancer Father        colon   Cancer Daughter        daughter   Cancer Son        skin   Cancer Maternal Aunt        breast   Hypertension Maternal Grandmother    Hypertension Maternal Grandfather    Heart attack Maternal Grandfather    Hypertension Paternal Grandmother    Stroke Paternal Grandfather    Cancer Maternal Aunt        breast    Prior to Admission medications   Medication Sig Start Date End Date Taking? Authorizing Provider  acetaminophen (TYLENOL) 500 MG tablet Take 1 tablet (500 mg total) by mouth every 6 (six) hours as needed. 06/13/22  Elgergawy, Silver Huguenin, MD  AMBULATORY NON FORMULARY MEDICATION Medication Name: Rollator 06/08/21   Silverio Decamp, MD  AMBULATORY NON [place] scooter for use daily 11/12/21   Silverio Decamp, MD  AMBULATORY NON FORMULARY MEDICATION Shower seat for use daily 05/16/22   Silverio Decamp, MD  apixaban (ELIQUIS) 5 MG TABS tablet Take 1 tablet (5 mg total) by mouth 2 (two) times daily. 06/13/22   Elgergawy, Silver Huguenin, MD  flecainide (TAMBOCOR) 50 MG tablet Take 50 mg by mouth 2 (two) times a day. 03/20/19    [provider]  furosemide (LASIX) 40 MG tablet Take 1 tablet (40 mg total) by mouth daily. 06/13/22   Elgergawy, Silver Huguenin, MD  gabapentin (NEURONTIN) 600 MG tablet Take 2 tablets (1,200 mg total) by mouth at bedtime. 01/24/22   Silverio Decamp, MD  icosapent Ethyl (VASCEPA) 1 g capsule Take 2 capsules (2 g total) by mouth 2 (two) times daily. 03/09/21   Silverio Decamp, MD  levothyroxine (SYNTHROID) 75 MCG tablet Take 1 tablet (75 mcg total) by mouth daily with breakfast. 01/24/22   Silverio Decamp, MD  metoprolol succinate (TOPROL-XL) 50 MG 24 hr tablet TAKE ONE TABLET BY MOUTH DAILY Patient taking differently: Take 50 mg by mouth daily. 03/01/22   Silverio Decamp, MD  Multiple Vitamins-Minerals (MULTIVITAMIN WITH MINERALS) tablet Take 1 tablet by mouth daily.    [provider]  ondansetron (ZOFRAN-ODT) 8 MG disintegrating tablet Take 1 tablet (8 mg total) by mouth every 8 (eight) hours as needed for nausea. 05/25/22   Silverio Decamp, MD  oxyCODONE (OXY IR/ROXICODONE) 5 MG immediate release tablet Take 1 tablet (5 mg total) by mouth every 6 (six) hours as needed for severe pain or moderate pain ('5mg'$  for moderate pain, '10mg'$  for severe pain). 06/13/22   Elgergawy, Silver Huguenin, MD  pantoprazole (PROTONIX) 40 MG tablet TAKE ONE TABLET BY MOUTH DAILY Patient taking differently: Take 40 mg by mouth daily. 04/28/22   Silverio Decamp, MD  potassium chloride (KLOR-CON M) 10 MEQ tablet Take 2 tablets (20 mEq total) by mouth daily. 06/13/22   Elgergawy, Silver Huguenin, MD  ramelteon (ROZEREM) 8 MG tablet 1 tablet p.o. at [facility] 01/24/22   Silverio Decamp, MD  traZODone (DESYREL) 100 MG tablet Take 1 tablet (100 mg total) by mouth at bedtime. 07/08/21   Silverio Decamp, MD  valsartan-hydrochlorothiazide (DIOVAN-HCT) 320-25 MG tablet Take 0.5 tablets by mouth daily. 05/25/22   Silverio Decamp, MD  venlafaxine XR (EFFEXOR-XR) 75 MG 24 hr capsule Take 3  capsules (225 mg total) by mouth daily. 05/23/22   Silverio Decamp, MD    Physical Exam: Constitutional: Moderately built and nourished. Vitals:   06/17/22 1845 06/17/22 1900 06/17/22 1945 06/17/22 2136  BP: (!) 158/78 (!) 153/76 (!) 153/69 (!) 159/69  Pulse: 77 74 80 84  Resp: '15 15 15 13  '$ Temp:   97.8 F (36.6 C) 98.1 F (36.7 C)  TempSrc:   Oral Oral  SpO2: 98% 100% 100% 99%   Eyes: Anicteric no pallor. ENMT: No discharge from the ears eyes nose and mouth. Neck: No mass felt.  No neck rigidity.  JVD elevated. Respiratory: No rhonchi or crepitations. Cardiovascular: S1-S2 heard. Abdomen: Soft tenderness around the stapling area.  No guarding or rigidity. Musculoskeletal: No edema. Skin: No rash. Neurologic: Alert awake oriented to time place and person.  Moves all extremities. Psychiatric: Normal.  Normal affect.   Labs  on Admission: I have personally reviewed following labs and imaging studies  CBC: Recent Labs  Lab 06/11/22 0215 06/12/22 0154 06/13/22 0521 06/17/22 1444 06/17/22 1754  WBC 4.3 4.1 3.8* 5.3  --   NEUTROABS  --   --   --  4.4  --   HGB 9.0* 9.1* 9.1* 10.2* 10.9*  HCT 28.7* 29.3* 29.7* 33.5* 32.0*  MCV 97.3 96.1 96.1 97.1  --   PLT 193 206 204 251  --    Basic Metabolic Panel: Recent Labs  Lab 06/11/22 0215 06/12/22 0154 06/13/22 0521 06/17/22 1444 06/17/22 1754  NA 143 143 141 140 138  K 3.7 3.8 3.1* 4.2 5.1  CL 104 102 97* 106  --   CO2 34* 35* 34* 25  --   GLUCOSE 101* 130* 98 155*  --   BUN 6* 8 5* 12  --   CREATININE 0.76 0.73 0.66 0.58  --   CALCIUM 8.2* 8.5* 8.7* 9.2  --   MG  --   --  1.7  --   --    GFR: Estimated Creatinine Clearance: 74.5 mL/min (by C-G formula based on SCr of 0.58 mg/dL). Liver Function Tests: Recent Labs  Lab 06/17/22 1444  AST 45*  ALT 23  ALKPHOS 87  BILITOT 0.5  PROT 6.2*  ALBUMIN 3.2*   No results for input(s): "LIPASE", "AMYLASE" in the last 168 hours. No results for input(s):  "AMMONIA" in the last 168 hours. Coagulation Profile: No results for input(s): "INR", "PROTIME" in the last 168 hours. Cardiac Enzymes: No results for input(s): "CKTOTAL", "CKMB", "CKMBINDEX", "TROPONINI" in the last 168 hours. BNP (last 3 results) No results for input(s): "PROBNP" in the last 8760 hours. HbA1C: No results for input(s): "HGBA1C" in the last 72 hours. CBG: No results for input(s): "GLUCAP" in the last 168 hours. Lipid Profile: No results for input(s): "CHOL", "HDL", "LDLCALC", "TRIG", "CHOLHDL", "LDLDIRECT" in the last 72 hours. Thyroid Function Tests: No results for input(s): "TSH", "T4TOTAL", "FREET4", "T3FREE", "THYROIDAB" in the last 72 hours. Anemia Panel: No results for input(s): "VITAMINB12", "FOLATE", "FERRITIN", "TIBC", "IRON", "RETICCTPCT" in the last 72 hours. Urine analysis:    Component Value Date/Time   COLORURINE YELLOW 05/27/2022 1030   APPEARANCEUR CLEAR 05/27/2022 1030   LABSPEC 1.019 05/27/2022 1030   PHURINE 5.0 05/27/2022 1030   GLUCOSEU NEGATIVE 05/27/2022 1030   HGBUR NEGATIVE 05/27/2022 1030   BILIRUBINUR NEGATIVE 05/27/2022 1030   BILIRUBINUR negative 03/09/2021 1554   KETONESUR 5 (A) 05/27/2022 1030   PROTEINUR NEGATIVE 05/27/2022 1030   UROBILINOGEN 0.2 03/09/2021 1554   NITRITE NEGATIVE 05/27/2022 1030   LEUKOCYTESUR NEGATIVE 05/27/2022 1030   Sepsis Labs: '@LABRCNTIP'$ (procalcitonin:4,lacticidven:4) )No results found for this or any previous visit (from the past 240 hour(s)).   Radiological Exams on Admission: DG Chest Port 1 View  Result Date: 06/17/2022 CLINICAL DATA:  Shortness of breath EXAM: PORTABLE CHEST 1 VIEW COMPARISON:  Chest radiograph done on 04/23/2007, CT done on 06/03/2022 FINDINGS: Transverse diameter of heart is increased. There is prominence of interstitial markings in the parahilar regions and lower lung fields. There is no focal consolidation. Lateral CP angles are clear. There is no pneumothorax. IMPRESSION:  Cardiomegaly. There is prominence of interstitial markings in the parahilar regions and lower lung fields suggesting possible mild interstitial edema or interstitial pneumonia. Electronically Signed   By: Elmer Picker M.D.   On: 06/17/2022 18:06   CT Abdomen Pelvis W Contrast  Result Date: 06/17/2022 CLINICAL DATA:  Nausea  and vomiting. 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 EXAM: CT ABDOMEN AND PELVIS WITH CONTRAST TECHNIQUE: Multidetector CT imaging of the abdomen and pelvis was performed using the standard protocol following bolus administration of intravenous contrast. RADIATION DOSE REDUCTION: This exam was performed according to the departmental dose-optimization program which includes automated exposure control, adjustment of the mA and/or kV according to patient size and/or use of iterative reconstruction technique. CONTRAST:  60m OMNIPAQUE IOHEXOL 300 MG/ML  SOLN COMPARISON:  CT 05/30/2022 FINDINGS: Lower chest: Small bilateral pleural effusions greater on the RIGHT. Mild basilar atelectasis. Hepatobiliary: No focal hepatic lesion. No biliary duct dilatation. Common bile duct is normal. Pancreas: Pancreas is normal. No ductal dilatation. No pancreatic inflammation. Spleen: Normal spleen Adrenals/urinary tract: Adrenal glands and kidneys are normal. The ureters and bladder normal. Stomach/Bowel: Stomach, duodenum small-bowel normal. Post RIGHT hemicolectomy. Enteric colonic anastomosis in the RIGHT upper quadrant. There is mild inflammatory stranding within the RIGHT upper quadrant mesentery related to recent surgery. No organized fluid collections. The transverse and descending colon are collapsed. No acute findings. Rectosigmoid colon normal. Vascular/Lymphatic: Abdominal aorta is normal caliber with atherosclerotic calcification. There is no retroperitoneal or periportal lymphadenopathy. No pelvic lymphadenopathy. Reproductive: Uterus and adnexa unremarkable. Other:  Midline incision noted. Skin staples at the skin surface. There is soft tissue thickening along the incision. No organized fluid collections (image 49/3) Musculoskeletal: No acute osseous abnormality. IMPRESSION: 1. Post RIGHT hemicolectomy. Expected postsurgical change in the RIGHT upper quadrant. No organized fluid collections. No bowel obstruction or inflammation. 2. Midline ventral wound without complicating features. 3. Bilateral small pleural effusions greater on the RIGHT. Electronically Signed   By: SSuzy BouchardM.D.   On: 06/17/2022 16:28    EKG: Independently reviewed.  Normal sinus rhythm.  Assessment/Plan Principal Problem:   Acute on chronic diastolic CHF (congestive heart failure) (HCC) Active Problems:   Paroxysmal atrial fibrillation (HCC)   Hypothyroidism   Nausea vomiting and diarrhea   CHF (congestive heart failure) (HCC)    Acute on chronic diastolic CHF last EF measured was 60 to 65% last week.  We will keep patient on Lasix 40 mg IV every 12 closely monitor intake output and metabolic panel. Nausea vomiting and diarrhea likely could be gastroenteritis.  CT abdomen pelvis largely unremarkable.  Advance diet as tolerated.  ER physician did discuss with on-call general surgeon and at this time general surgery. Paroxysmal atrial fibrillation status post ablation on Eliquis flecainide and Toprol. Acute blood loss anemia -hemoglobin at baseline. Hypothyroidism on Synthroid. Hypertension on Toprol and ARB and hydrochlorothiazide. Recent surgery for colon mass also was found to have ischemic colitis.   DVT prophylaxis: Eliquis. Code Status: Full code. Family Communication: Discussed with patient. Disposition Plan: Home. Consults called: None. Admission status: Observation.   ARise PatienceMD [facility] Pager [phone]  If 7PM-7AM, please contact night-coverage [email or web address] Password [email or web address] 06/17/2022, 10:17 PM

## 2022-06-17 NOTE — Telephone Encounter (Signed)
KiKi from [place] called to request a VO for skilled nursing once weekly for 8 weeks. She also stated that patient does not have any valsartan in her home. If she is to continue this meds, she will need a new prescription. Also, she wanted verification that patient was to stop the lasix.

## 2022-06-17 NOTE — ED Provider Notes (Signed)
[facility] EMERGENCY DEPARTMENT Provider Note   CSN: [id number] Arrival date & time: 06/17/22  1336     History  Chief Complaint  Patient presents with   Nausea   Emesis    [name] is a 71 y.o. female.  HPI Patient presents 1 week after hemicolectomy with tumor removal now with concern for abdominal pain, nausea, vomiting, diarrhea.  She notes that she recovered generally well, went home but over the past 24 hours has developed these new symptoms.  No relief with anything.    Home Medications Prior to Admission medications   Medication Sig Start Date End Date Taking? Authorizing Provider  acetaminophen (TYLENOL) 500 MG tablet Take 1 tablet (500 mg total) by mouth every 6 (six) hours as needed. 06/13/22   Elgergawy, Silver Huguenin, MD  AMBULATORY NON FORMULARY MEDICATION Medication Name: Rollator 06/08/21   Silverio Decamp, MD  AMBULATORY NON [place] scooter for use daily 11/12/21   Silverio Decamp, MD  AMBULATORY NON FORMULARY MEDICATION Shower seat for use daily 05/16/22   Silverio Decamp, MD  apixaban (ELIQUIS) 5 MG TABS tablet Take 1 tablet (5 mg total) by mouth 2 (two) times daily. 06/13/22   Elgergawy, Silver Huguenin, MD  flecainide (TAMBOCOR) 50 MG tablet Take 50 mg by mouth 2 (two) times a day. 03/20/19   [provider]  furosemide (LASIX) 40 MG tablet Take 1 tablet (40 mg total) by mouth daily. 06/13/22   Elgergawy, Silver Huguenin, MD  gabapentin (NEURONTIN) 600 MG tablet Take 2 tablets (1,200 mg total) by mouth at bedtime. 01/24/22   Silverio Decamp, MD  icosapent Ethyl (VASCEPA) 1 g capsule Take 2 capsules (2 g total) by mouth 2 (two) times daily. 03/09/21   Silverio Decamp, MD  levothyroxine (SYNTHROID) 75 MCG tablet Take 1 tablet (75 mcg total) by mouth daily with breakfast. 01/24/22   Silverio Decamp, MD  metoprolol succinate (TOPROL-XL) 50 MG 24 hr tablet TAKE ONE TABLET BY MOUTH DAILY Patient  taking differently: Take 50 mg by mouth daily. 03/01/22   Silverio Decamp, MD  Multiple Vitamins-Minerals (MULTIVITAMIN WITH MINERALS) tablet Take 1 tablet by mouth daily.    [provider]  ondansetron (ZOFRAN-ODT) 8 MG disintegrating tablet Take 1 tablet (8 mg total) by mouth every 8 (eight) hours as needed for nausea. 05/25/22   Silverio Decamp, MD  oxyCODONE (OXY IR/ROXICODONE) 5 MG immediate release tablet Take 1 tablet (5 mg total) by mouth every 6 (six) hours as needed for severe pain or moderate pain ('5mg'$  for moderate pain, '10mg'$  for severe pain). 06/13/22   Elgergawy, Silver Huguenin, MD  pantoprazole (PROTONIX) 40 MG tablet TAKE ONE TABLET BY MOUTH DAILY Patient taking differently: Take 40 mg by mouth daily. 04/28/22   Silverio Decamp, MD  potassium chloride (KLOR-CON M) 10 MEQ tablet Take 2 tablets (20 mEq total) by mouth daily. 06/13/22   Elgergawy, Silver Huguenin, MD  ramelteon (ROZEREM) 8 MG tablet 1 tablet p.o. at [facility] 01/24/22   Silverio Decamp, MD  traZODone (DESYREL) 100 MG tablet Take 1 tablet (100 mg total) by mouth at bedtime. 07/08/21   Silverio Decamp, MD  valsartan-hydrochlorothiazide (DIOVAN-HCT) 320-25 MG tablet Take 0.5 tablets by mouth daily. 05/25/22   Silverio Decamp, MD  venlafaxine XR (EFFEXOR-XR) 75 MG 24 hr capsule Take 3 capsules (225 mg total) by mouth daily. 05/23/22   Silverio Decamp, MD  Allergies    Patient has no known allergies.    Review of Systems   Review of Systems  All other systems reviewed and are negative.   Physical Exam Updated Vital Signs BP (!) 149/79 (BP Location: Right Arm)   Pulse 75   Temp 97.6 F (36.4 C) (Oral)   Resp 19   SpO2 100%  Physical Exam Vitals and nursing note reviewed.  Constitutional:      General: She is not in acute distress.    Appearance: She is well-developed.  HENT:     Head: Normocephalic and atraumatic.  Eyes:     Conjunctiva/sclera: Conjunctivae normal.   Cardiovascular:     Rate and Rhythm: Normal rate and regular rhythm.  Pulmonary:     Effort: Pulmonary effort is normal. No respiratory distress.     Breath sounds: Normal breath sounds. No stridor.  Abdominal:     General: There is no distension.     Tenderness: There is abdominal tenderness. There is guarding.     Comments: Surgical wounds clean, dry, intact no obvious purulence, no bleeding  Skin:    General: Skin is warm and dry.  Neurological:     Mental Status: She is alert and oriented to person, place, and time.     Cranial Nerves: No cranial nerve deficit.  Psychiatric:        Mood and Affect: Mood normal.     ED Results / Procedures / Treatments   Labs (all labs ordered are listed, but only abnormal results are displayed) Labs Reviewed  COMPREHENSIVE METABOLIC PANEL - Abnormal; Notable for the following components:      Result Value   Glucose, Bld 155 (*)    Total Protein 6.2 (*)    Albumin 3.2 (*)    AST 45 (*)    All other components within normal limits  CBC WITH DIFFERENTIAL/PLATELET - Abnormal; Notable for the following components:   RBC 3.45 (*)    Hemoglobin 10.2 (*)    HCT 33.5 (*)    RDW 16.0 (*)    Lymphs Abs 0.5 (*)    All other components within normal limits    EKG EKG Interpretation  Date/Time:  Friday June 17 2022 13:43:42 EDT Ventricular Rate:  80 PR Interval:  189 QRS Duration: 91 QT Interval:  407 QTC Calculation: 470 R Axis:   -11 Text Interpretation: Sinus rhythm unremarkable ecg Confirmed by Carmin Muskrat [phone]) on 06/17/2022 3:58:38 PM  Radiology No results found.  Procedures Procedures    Medications Ordered in ED Medications  metoCLOPramide (REGLAN) injection 10 mg (has no administration in time range)  diphenhydrAMINE (BENADRYL) injection 25 mg (has no administration in time range)  sodium chloride 0.9 % bolus 500 mL (500 mLs Intravenous New Bag/Given 06/17/22 1511)  ondansetron (ZOFRAN) injection 4 mg (4 mg  Intravenous Given 06/17/22 1508)    ED Course/ Medical Decision Making/ A&P This patient with a Hx of recent hemicolectomy presents to the ED for concern of, vomiting, diarrhea, abdominal pain, this involves an extensive number of treatment options, and is a complaint that carries with it a high risk of complications and morbidity.    The differential diagnosis includes infection, obstruction, abscess, perforation   Social Determinants of Health:  No limits  Additional history obtained:  Additional history and/or information obtained from chart review, notable for history of hemicolectomy last week, seemingly uncomplicated   After the initial evaluation, orders, including: CT labs antiemetics were initiated.   Patient placed  on Cardiac and Pulse-Oximetry Monitors. The patient was maintained on a cardiac monitor.  The cardiac monitored showed an rhythm of 80 sinus normal The patient was also maintained on pulse oximetry. The readings were typically 100% room air normal   On repeat evaluation of the patient stayed the same  Lab Tests:  I personally interpreted labs.  The pertinent results include: Unremarkable labs   Consultations Obtained:  I requested consultation with the neurosurgery,  and discussed lab findings as well as pertinent plan - they recommend: Consultation pending  Dispostion / Final MDM:  After consideration of the diagnostic results and the patient's response to treatment, adult female with recent hemicolectomy presents with new abdominal pain nausea, vomiting, diarrhea.  Patient's vital signs are reassuring, but given concern for recent surgery, cancer, differential as above CT scan was ordered and is pending on signout.  Final Clinical Impression(s) / ED Diagnoses Final diagnoses:  Nausea vomiting and diarrhea  Generalized abdominal pain     Carmin Muskrat, MD 06/17/22 1559

## 2022-06-17 NOTE — ED Triage Notes (Signed)
Pt to ED via [place] c/o nausea/vomiting since this morning. Pt has hx of bowel resection and tumor removed last Thursday.

## 2022-06-17 NOTE — ED Provider Notes (Signed)
  Physical Exam  BP (!) 153/76   Pulse 74   Temp 97.6 F (36.4 C) (Oral)   Resp 15   SpO2 100%   Physical Exam  Procedures  Procedures  ED Course / MDM    Medical Decision Making Care assumed at 3 pm.  Patient has recent hemicolectomy for tumor removal.  Patient is here with nausea vomiting.  Patient is also feeling short of breath as well.  Unfortunately, patient has baseline diastolic heart failure and elevated RV pressures.  Signout pending CT abdomen pelvis and possible surgical evaluation  5 pm CT did not show any obstruction.  I discussed case with Dr. Thermon Leyland.  He states that there is no postsurgical complication right now.  He recommends medicine admission if patient is not feeling well.  Patient is now on 2 L nasal cannula.  CT showed pleural effusion.  We will get chest x-ray and troponin and BNP  7:20 PM Unfortunately her BNP is elevated at 400.  Chest x-ray showed pulmonary edema.  Patient is still not tolerating p.o. very well.  Patient received 1 L normal saline bolus.  At this point, patient will be admitted for observation.  Problems Addressed: Acute pulmonary edema ([place]): acute illness or injury Generalized abdominal pain: acute illness or injury Nausea vomiting and diarrhea: acute illness or injury  Amount and/or Complexity of Data Reviewed Labs: ordered. Decision-making details documented in ED Course. Radiology: ordered and independent interpretation performed. Decision-making details documented in ED Course.  Risk Prescription drug management.          Drenda Freeze, MD 06/17/22 Lurena Nida

## 2022-06-17 NOTE — Telephone Encounter (Signed)
FYI: Son called today to cancel his Mother's appt with nurse . Pt is being admitted to [facility] today.

## 2022-06-18 DIAGNOSIS — I5033 Acute on chronic diastolic (congestive) heart failure: Secondary | ICD-10-CM | POA: Diagnosis not present

## 2022-06-18 LAB — COMPREHENSIVE METABOLIC PANEL
ALT: 21 U/L (ref 0–44)
AST: 40 U/L (ref 15–41)
Albumin: 3 g/dL — ABNORMAL LOW (ref 3.5–5.0)
Alkaline Phosphatase: 79 U/L (ref 38–126)
Anion gap: 11 (ref 5–15)
BUN: 7 mg/dL — ABNORMAL LOW (ref 8–23)
CO2: 27 mmol/L (ref 22–32)
Calcium: 9.2 mg/dL (ref 8.9–10.3)
Chloride: 102 mmol/L (ref 98–111)
Creatinine, Ser: 0.65 mg/dL (ref 0.44–1.00)
GFR, Estimated: >60 mL/min (ref >60)
Glucose, Bld: 111 mg/dL — ABNORMAL HIGH (ref 70–99)
Potassium: 3.6 mmol/L (ref 3.5–5.1)
Sodium: 140 mmol/L (ref 135–145)
Total Bilirubin: 0.8 mg/dL (ref 0.3–1.2)
Total Protein: 6 g/dL — ABNORMAL LOW (ref 6.5–8.1)

## 2022-06-18 LAB — CBC
HCT: 30.3 % — ABNORMAL LOW (ref 36.0–46.0)
Hemoglobin: 9.5 g/dL — ABNORMAL LOW (ref 12.0–15.0)
MCH: 29.6 pg (ref 26.0–34.0)
MCHC: 31.4 g/dL (ref 30.0–36.0)
MCV: 94.4 fL (ref 80.0–100.0)
Platelets: 257 10*3/uL (ref 150–400)
RBC: 3.21 MIL/uL — ABNORMAL LOW (ref 3.87–5.11)
RDW: 15.9 % — ABNORMAL HIGH (ref 11.5–15.5)
WBC: 5.6 10*3/uL (ref 4.0–10.5)
nRBC: 0 % (ref 0.0–0.2)

## 2022-06-18 MED ORDER — SPIRONOLACTONE 25 MG PO TABS
25.0000 mg | ORAL_TABLET | Freq: Every day | ORAL | Status: DC
  Administered 2022-06-18 – 2022-06-21 (×4): 25 mg via ORAL
  Filled 2022-06-18 (×4): qty 1

## 2022-06-18 MED ORDER — GABAPENTIN 400 MG PO CAPS
400.0000 mg | ORAL_CAPSULE | Freq: Every day | ORAL | Status: DC
  Administered 2022-06-18 – 2022-06-20 (×3): 400 mg via ORAL
  Filled 2022-06-18 (×3): qty 1

## 2022-06-18 NOTE — Progress Notes (Signed)
PROGRESS NOTE    ADDELYNN [name]  IZT:[id number] DOB: February 20, 1951 DOA: 06/17/2022 PCP: Silverio Decamp, MD  71 y.o. female with history of atrial fibrillation status post ablation, hypothyroidism who was recently admitted for ischemic colitis and during the work-up was found to have colon mass underwent hemicolectomy, path was negative for malignancy was discharged home about 5 days ago presented to the ER because of nausea vomiting diarrhea ongoing 8/18 a.m.  Patient also was feeling short of breath.  Patient was discharged on Lasix which patient stopped taking about 4 days ago.   ED Course: In the ER CT abdomen pelvis does not show anything acute.  Does show some pleural effusion.  Chest x-ray shows features concerning for pulmonary edema   Subjective: -Had some nausea last night, got multiple meds for sleep, now drowsy  Assessment and Plan:  Acute on chronic diastolic CHF - last EF measured was 60 to 65% last week. -Continue Lasix 40 Mg twice daily today she is 1 L negative, creatinine stable at 0.6 -Add Aldactone, DC HCTZ -Continue ARB -Monitor I's/O, daily weights  Nausea vomiting, diarrhea -Appears to have improved, likely related to postop state -CT abdomen pelvis on admission largely unremarkable -Full liquids, advance as tolerated  Lethargy -Secondary to meds last night for sleep -Decrease gabapentin dose  Paroxysmal atrial fibrillation -History of ablation, continue Toprol, flecainide, Eliquis  Recent ischemic colitis, colon mass -Underwent right hemicolectomy, path negative for malignancy -Routine wound care, follow-up with [facility]  Acute blood loss anemia -Hemoglobin is stable  Hypertension -Continue Toprol and ARB, DC HCTZ  Hypothyroidism -Continue Synthroid   DVT prophylaxis: Eliquis. Code Status: Full code. Family Communication: Discussed with patient. Disposition Plan: Home.  Consultants:    Procedures:   Antimicrobials:     Objective: Vitals:   06/17/22 1900 06/17/22 1945 06/17/22 2136 06/18/22 0015  BP: (!) 153/76 (!) 153/69 (!) 159/69 (!) 164/73  Pulse: 74 80 84 84  Resp: '15 15 13 16  '$ Temp:  97.8 F (36.6 C) 98.1 F (36.7 C) 98.5 F (36.9 C)  TempSrc:  Oral Oral Oral  SpO2: 100% 100% 99% 99%  Weight:   99.8 kg 99.8 kg  Height:   '5\' 3"'$  (1.6 m)     Intake/Output Summary (Last 24 hours) at 06/18/2022 1030 Last data filed at 06/18/2022 0600 Gross per 24 hour  Intake 240 ml  Output 1050 ml  Net -810 ml   Filed Weights   06/17/22 2136 06/18/22 0015  Weight: 99.8 kg 99.8 kg    Examination:  General exam: Somnolent, arousable, AAO x2, no distress HEENT: Neck obese unable to assess JVD CVS: S1-S2, regular rhythm Lungs: Decreased breath sounds to bases Abdomen: Soft, nontender, bowel sounds present, abdominal lap port sites unremarkable with dressing Extremities: no edema Skin: No rashes Psychiatry:  Mood & affect appropriate.     Data Reviewed:   CBC: Recent Labs  Lab 06/12/22 0154 06/13/22 0521 06/17/22 1444 06/17/22 1754 06/18/22 0326  WBC 4.1 3.8* 5.3  --  5.6  NEUTROABS  --   --  4.4  --   --   HGB 9.1* 9.1* 10.2* 10.9* 9.5*  HCT 29.3* 29.7* 33.5* 32.0* 30.3*  MCV 96.1 96.1 97.1  --  94.4  PLT 206 204 251  --  382   Basic Metabolic Panel: Recent Labs  Lab 06/12/22 0154 06/13/22 0521 06/17/22 1444 06/17/22 1754 06/18/22 0326  NA 143 141 140 138 140  K 3.8 3.1* 4.2 5.1 3.6  CL 102 97* 106  --  102  CO2 35* 34* 25  --  27  GLUCOSE 130* 98 155*  --  111*  BUN 8 5* 12  --  7*  CREATININE 0.73 0.66 0.58  --  0.65  CALCIUM 8.5* 8.7* 9.2  --  9.2  MG  --  1.7  --   --   --    GFR: Estimated Creatinine Clearance: 72.7 mL/min (by C-G formula based on SCr of 0.65 mg/dL). Liver Function Tests: Recent Labs  Lab 06/17/22 1444 06/18/22 0326  AST 45* 40  ALT 23 21  ALKPHOS 87 79  BILITOT 0.5 0.8  PROT 6.2* 6.0*  ALBUMIN 3.2* 3.0*   No results for input(s):  "LIPASE", "AMYLASE" in the last 168 hours. No results for input(s): "AMMONIA" in the last 168 hours. Coagulation Profile: No results for input(s): "INR", "PROTIME" in the last 168 hours. Cardiac Enzymes: No results for input(s): "CKTOTAL", "CKMB", "CKMBINDEX", "TROPONINI" in the last 168 hours. BNP (last 3 results) No results for input(s): "PROBNP" in the last 8760 hours. HbA1C: No results for input(s): "HGBA1C" in the last 72 hours. CBG: No results for input(s): "GLUCAP" in the last 168 hours. Lipid Profile: No results for input(s): "CHOL", "HDL", "LDLCALC", "TRIG", "CHOLHDL", "LDLDIRECT" in the last 72 hours. Thyroid Function Tests: No results for input(s): "TSH", "T4TOTAL", "FREET4", "T3FREE", "THYROIDAB" in the last 72 hours. Anemia Panel: No results for input(s): "VITAMINB12", "FOLATE", "FERRITIN", "TIBC", "IRON", "RETICCTPCT" in the last 72 hours. Urine analysis:    Component Value Date/Time   COLORURINE YELLOW 05/27/2022 1030   APPEARANCEUR CLEAR 05/27/2022 1030   LABSPEC 1.019 05/27/2022 1030   PHURINE 5.0 05/27/2022 1030   GLUCOSEU NEGATIVE 05/27/2022 1030   HGBUR NEGATIVE 05/27/2022 1030   BILIRUBINUR NEGATIVE 05/27/2022 1030   BILIRUBINUR negative 03/09/2021 1554   KETONESUR 5 (A) 05/27/2022 1030   PROTEINUR NEGATIVE 05/27/2022 1030   UROBILINOGEN 0.2 03/09/2021 1554   NITRITE NEGATIVE 05/27/2022 1030   LEUKOCYTESUR NEGATIVE 05/27/2022 1030   Sepsis Labs: '@LABRCNTIP'$ (procalcitonin:4,lacticidven:4)  ) Recent Results (from the past 240 hour(s))  SARS Coronavirus 2 by RT PCR (hospital order, performed in [place] hospital lab) *cepheid single result test* Anterior Nasal Swab     Status: None   Collection Time: 06/17/22  9:39 PM   Specimen: Anterior Nasal Swab  Result Value Ref Range Status   SARS Coronavirus 2 by RT PCR NEGATIVE NEGATIVE Final    Comment: (NOTE) SARS-CoV-2 target nucleic acids are NOT DETECTED.  The SARS-CoV-2 RNA is generally detectable in  upper and lower respiratory specimens during the acute phase of infection. The lowest concentration of SARS-CoV-2 viral copies this assay can detect is 250 copies / mL. A negative result does not preclude SARS-CoV-2 infection and should not be used as the sole basis for treatment or other patient management decisions.  A negative result may occur with improper specimen collection / handling, submission of specimen other than nasopharyngeal swab, presence of viral mutation(s) within the areas targeted by this assay, and inadequate number of viral copies (<250 copies / mL). A negative result must be combined with clinical observations, patient history, and epidemiological information.  Fact Sheet for Patients:   https://www.patel.info/  Fact Sheet for Healthcare Providers: https://hall.com/  This test is not yet approved or  cleared by the [place] FDA and has been authorized for detection and/or diagnosis of SARS-CoV-2 by FDA under an Emergency Use Authorization (EUA).  This EUA will remain in effect (  meaning this test can be used) for the duration of the COVID-19 declaration under Section 564(b)(1) of the Act, 21 U.S.C. section 360bbb-3(b)(1), unless the authorization is terminated or revoked sooner.  Performed at [facility], [place] [address], [place], [place] [address]      Radiology Studies: DG Chest Port 1 View  Result Date: 06/17/2022 CLINICAL DATA:  Shortness of breath EXAM: PORTABLE CHEST 1 VIEW COMPARISON:  Chest radiograph done on 04/23/2007, CT done on 06/03/2022 FINDINGS: Transverse diameter of heart is increased. There is prominence of interstitial markings in the parahilar regions and lower lung fields. There is no focal consolidation. Lateral CP angles are clear. There is no pneumothorax. IMPRESSION: Cardiomegaly. There is prominence of interstitial markings in the parahilar regions and lower lung fields suggesting  possible mild interstitial edema or interstitial pneumonia. Electronically Signed   By: Elmer Picker M.D.   On: 06/17/2022 18:06   CT Abdomen Pelvis W Contrast  Result Date: 06/17/2022 CLINICAL DATA:  Nausea and vomiting. 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 EXAM: CT ABDOMEN AND PELVIS WITH CONTRAST TECHNIQUE: Multidetector CT imaging of the abdomen and pelvis was performed using the standard protocol following bolus administration of intravenous contrast. RADIATION DOSE REDUCTION: This exam was performed according to the departmental dose-optimization program which includes automated exposure control, adjustment of the mA and/or kV according to patient size and/or use of iterative reconstruction technique. CONTRAST:  69m OMNIPAQUE IOHEXOL 300 MG/ML  SOLN COMPARISON:  CT 05/30/2022 FINDINGS: Lower chest: Small bilateral pleural effusions greater on the RIGHT. Mild basilar atelectasis. Hepatobiliary: No focal hepatic lesion. No biliary duct dilatation. Common bile duct is normal. Pancreas: Pancreas is normal. No ductal dilatation. No pancreatic inflammation. Spleen: Normal spleen Adrenals/urinary tract: Adrenal glands and kidneys are normal. The ureters and bladder normal. Stomach/Bowel: Stomach, duodenum small-bowel normal. Post RIGHT hemicolectomy. Enteric colonic anastomosis in the RIGHT upper quadrant. There is mild inflammatory stranding within the RIGHT upper quadrant mesentery related to recent surgery. No organized fluid collections. The transverse and descending colon are collapsed. No acute findings. Rectosigmoid colon normal. Vascular/Lymphatic: Abdominal aorta is normal caliber with atherosclerotic calcification. There is no retroperitoneal or periportal lymphadenopathy. No pelvic lymphadenopathy. Reproductive: Uterus and adnexa unremarkable. Other: Midline incision noted. Skin staples at the skin surface. There is soft tissue thickening along the incision. No organized  fluid collections (image 49/3) Musculoskeletal: No acute osseous abnormality. IMPRESSION: 1. Post RIGHT hemicolectomy. Expected postsurgical change in the RIGHT upper quadrant. No organized fluid collections. No bowel obstruction or inflammation. 2. Midline ventral wound without complicating features. 3. Bilateral small pleural effusions greater on the RIGHT. Electronically Signed   By: SSuzy BouchardM.D.   On: 06/17/2022 16:28     Scheduled Meds:  apixaban  5 mg Oral BID   flecainide  50 mg Oral Q12H   furosemide  40 mg Intravenous Q12H   gabapentin  400 mg Oral QHS   irbesartan  300 mg Oral Daily   levothyroxine  75 mcg Oral Q breakfast   metoprolol succinate  50 mg Oral Daily   pantoprazole  40 mg Oral Daily   ramelteon  8 mg Oral QHS   traZODone  100 mg Oral QHS   venlafaxine XR  225 mg Oral Daily   Continuous Infusions:   LOS: 0 days    Time spent: 325m  PrDomenic PoliteMD [facility]   06/18/2022, 10:30 AM

## 2022-06-19 DIAGNOSIS — I5033 Acute on chronic diastolic (congestive) heart failure: Secondary | ICD-10-CM | POA: Diagnosis not present

## 2022-06-19 LAB — CBC
HCT: 32 % — ABNORMAL LOW (ref 36.0–46.0)
Hemoglobin: 10.2 g/dL — ABNORMAL LOW (ref 12.0–15.0)
MCH: 30.4 pg (ref 26.0–34.0)
MCHC: 31.9 g/dL (ref 30.0–36.0)
MCV: 95.2 fL (ref 80.0–100.0)
Platelets: 279 10*3/uL (ref 150–400)
RBC: 3.36 MIL/uL — ABNORMAL LOW (ref 3.87–5.11)
RDW: 15.7 % — ABNORMAL HIGH (ref 11.5–15.5)
WBC: 4.5 10*3/uL (ref 4.0–10.5)
nRBC: 0 % (ref 0.0–0.2)

## 2022-06-19 LAB — BASIC METABOLIC PANEL
Anion gap: 12 (ref 5–15)
BUN: 8 mg/dL (ref 8–23)
CO2: 35 mmol/L — ABNORMAL HIGH (ref 22–32)
Calcium: 9.2 mg/dL (ref 8.9–10.3)
Chloride: 93 mmol/L — ABNORMAL LOW (ref 98–111)
Creatinine, Ser: 0.8 mg/dL (ref 0.44–1.00)
GFR, Estimated: >60 mL/min (ref >60)
Glucose, Bld: 105 mg/dL — ABNORMAL HIGH (ref 70–99)
Potassium: 3.1 mmol/L — ABNORMAL LOW (ref 3.5–5.1)
Sodium: 140 mmol/L (ref 135–145)

## 2022-06-19 MED ORDER — POTASSIUM CHLORIDE CRYS ER 20 MEQ PO TBCR
40.0000 meq | EXTENDED_RELEASE_TABLET | Freq: Two times a day (BID) | ORAL | Status: AC
  Administered 2022-06-19: 40 meq via ORAL
  Filled 2022-06-19: qty 2

## 2022-06-19 MED ORDER — TRAZODONE HCL 50 MG PO TABS
50.0000 mg | ORAL_TABLET | Freq: Every day | ORAL | Status: DC
  Administered 2022-06-19 – 2022-06-20 (×2): 50 mg via ORAL
  Filled 2022-06-19 (×2): qty 1

## 2022-06-19 MED ORDER — FUROSEMIDE 40 MG PO TABS
40.0000 mg | ORAL_TABLET | Freq: Two times a day (BID) | ORAL | Status: DC
  Administered 2022-06-19 – 2022-06-20 (×2): 40 mg via ORAL
  Filled 2022-06-19 (×2): qty 1

## 2022-06-19 MED ORDER — IRBESARTAN 300 MG PO TABS
150.0000 mg | ORAL_TABLET | Freq: Every day | ORAL | Status: DC
  Administered 2022-06-19: 150 mg via ORAL
  Filled 2022-06-19 (×2): qty 1

## 2022-06-19 NOTE — Progress Notes (Signed)
PROGRESS NOTE    [name]  UVO:[id number] DOB: May 09, 1951 DOA: 06/17/2022 PCP: Silverio Decamp, MD  71 y.o. female with history of atrial fibrillation status post ablation, hypothyroidism who was recently admitted for ischemic colitis and during the work-up was found to have colon mass underwent hemicolectomy, path was negative for malignancy was discharged home about 5 days ago presented to the ER because of nausea vomiting diarrhea ongoing 8/18 a.m.  Patient also was feeling short of breath.  Patient was discharged on Lasix which patient stopped taking about 4 days ago.   ED Course: CT abdomen pelvis was unremarkable chest x-ray shows features concerning for pulmonary edema   Subjective: -No further vomiting or diarrhea, breathing improving, mild dizziness today  Assessment and Plan:  Acute on chronic diastolic CHF - last EF measured was 60 to 65% last week. -Diuresed with IV Lasix she is over 2 L negative, creatinine stable at 0.8  -Appears euvolemic , will switch to oral Lasix today, continue Aldactone  -History of UTIs, will avoid SGLT2i, continue ARB -Dizziness today, check orthostatics -Monitor I's/O, daily weights -Ambulate, PT OT eval  Nausea vomiting, diarrhea -Appears to have improved, likely related to postop state -CT abdomen pelvis on admission largely unremarkable -Advance diet  Lethargy -Secondary to meds last night for sleep -Improved, gabapentin dose decreased  Paroxysmal atrial fibrillation -History of ablation, continue Toprol, flecainide, Eliquis  Recent ischemic colitis, colon mass -Underwent right hemicolectomy, path negative for malignancy -Routine wound care, follow-up with [facility]  Acute blood loss anemia -Hemoglobin is stable  Hypertension -Continue Toprol and ARB, DC HCTZ  Hypothyroidism -Continue Synthroid   DVT prophylaxis: Eliquis. Code Status: Full code. Family Communication: Discussed with patient. Disposition Plan: Home.   Likely 1 to 2 days  Consultants:    Procedures:   Antimicrobials:    Objective: Vitals:   06/18/22 0744 06/19/22 0320 06/19/22 0321 06/19/22 0816  BP: 137/65  139/62 (!) 143/66  Pulse: 84  74 64  Resp: '20  19 16  '$ Temp: 98.7 F (37.1 C)  98.2 F (36.8 C) 98.4 F (36.9 C)  TempSrc: Oral  Oral Oral  SpO2: 98%  92% 98%  Weight:  94.3 kg    Height:        Intake/Output Summary (Last 24 hours) at 06/19/2022 1120 Last data filed at 06/19/2022 0800 Gross per 24 hour  Intake 120 ml  Output 1800 ml  Net -1680 ml   Filed Weights   06/17/22 2136 06/18/22 0015 06/19/22 0320  Weight: 99.8 kg 99.8 kg 94.3 kg    Examination:  General exam: Awake alert, oriented x3 HEENT: Neck obese unable to assess JVD CVS: S1-S2, regular rhythm Lungs: Decreased breath sounds bases Abdomen: Soft, obese, nontender, bowel sounds present, abdominal lap port sites with dressing Extremities: No edema Skin: No rashes on exposed skin Psychiatry:  Mood & affect appropriate.     Data Reviewed:   CBC: Recent Labs  Lab 06/13/22 0521 06/17/22 1444 06/17/22 1754 06/18/22 0326 06/19/22 0453  WBC 3.8* 5.3  --  5.6 4.5  NEUTROABS  --  4.4  --   --   --   HGB 9.1* 10.2* 10.9* 9.5* 10.2*  HCT 29.7* 33.5* 32.0* 30.3* 32.0*  MCV 96.1 97.1  --  94.4 95.2  PLT 204 251  --  257 742   Basic Metabolic Panel: Recent Labs  Lab 06/13/22 0521 06/17/22 1444 06/17/22 1754 06/18/22 0326 06/19/22 0453  NA 141 140 138 140 140  K 3.1* 4.2 5.1 3.6 3.1*  CL 97* 106  --  102 93*  CO2 34* 25  --  27 35*  GLUCOSE 98 155*  --  111* 105*  BUN 5* 12  --  7* 8  CREATININE 0.66 0.58  --  0.65 0.80  CALCIUM 8.7* 9.2  --  9.2 9.2  MG 1.7  --   --   --   --    GFR: Estimated Creatinine Clearance: 70.5 mL/min (by C-G formula based on SCr of 0.8 mg/dL). Liver Function Tests: Recent Labs  Lab 06/17/22 1444 06/18/22 0326  AST 45* 40  ALT 23 21  ALKPHOS 87 79  BILITOT 0.5 0.8  PROT 6.2* 6.0*  ALBUMIN 3.2*  3.0*   No results for input(s): "LIPASE", "AMYLASE" in the last 168 hours. No results for input(s): "AMMONIA" in the last 168 hours. Coagulation Profile: No results for input(s): "INR", "PROTIME" in the last 168 hours. Cardiac Enzymes: No results for input(s): "CKTOTAL", "CKMB", "CKMBINDEX", "TROPONINI" in the last 168 hours. BNP (last 3 results) No results for input(s): "PROBNP" in the last 8760 hours. HbA1C: No results for input(s): "HGBA1C" in the last 72 hours. CBG: No results for input(s): "GLUCAP" in the last 168 hours. Lipid Profile: No results for input(s): "CHOL", "HDL", "LDLCALC", "TRIG", "CHOLHDL", "LDLDIRECT" in the last 72 hours. Thyroid Function Tests: No results for input(s): "TSH", "T4TOTAL", "FREET4", "T3FREE", "THYROIDAB" in the last 72 hours. Anemia Panel: No results for input(s): "VITAMINB12", "FOLATE", "FERRITIN", "TIBC", "IRON", "RETICCTPCT" in the last 72 hours. Urine analysis:    Component Value Date/Time   COLORURINE YELLOW 05/27/2022 1030   APPEARANCEUR CLEAR 05/27/2022 1030   LABSPEC 1.019 05/27/2022 1030   PHURINE 5.0 05/27/2022 1030   GLUCOSEU NEGATIVE 05/27/2022 1030   HGBUR NEGATIVE 05/27/2022 1030   BILIRUBINUR NEGATIVE 05/27/2022 1030   BILIRUBINUR negative 03/09/2021 1554   KETONESUR 5 (A) 05/27/2022 1030   PROTEINUR NEGATIVE 05/27/2022 1030   UROBILINOGEN 0.2 03/09/2021 1554   NITRITE NEGATIVE 05/27/2022 1030   LEUKOCYTESUR NEGATIVE 05/27/2022 1030   Sepsis Labs: '@LABRCNTIP'$ (procalcitonin:4,lacticidven:4)  ) Recent Results (from the past 240 hour(s))  SARS Coronavirus 2 by RT PCR (hospital order, performed in [place] hospital lab) *cepheid single result test* Anterior Nasal Swab     Status: None   Collection Time: 06/17/22  9:39 PM   Specimen: Anterior Nasal Swab  Result Value Ref Range Status   SARS Coronavirus 2 by RT PCR NEGATIVE NEGATIVE Final    Comment: (NOTE) SARS-CoV-2 target nucleic acids are NOT DETECTED.  The  SARS-CoV-2 RNA is generally detectable in upper and lower respiratory specimens during the acute phase of infection. The lowest concentration of SARS-CoV-2 viral copies this assay can detect is 250 copies / mL. A negative result does not preclude SARS-CoV-2 infection and should not be used as the sole basis for treatment or other patient management decisions.  A negative result may occur with improper specimen collection / handling, submission of specimen other than nasopharyngeal swab, presence of viral mutation(s) within the areas targeted by this assay, and inadequate number of viral copies (<250 copies / mL). A negative result must be combined with clinical observations, patient history, and epidemiological information.  Fact Sheet for Patients:   https://www.patel.info/  Fact Sheet for Healthcare Providers: https://hall.com/  This test is not yet approved or  cleared by the [place] FDA and has been authorized for detection and/or diagnosis of SARS-CoV-2 by FDA under an Emergency Use Authorization (EUA).  This EUA will remain in effect (meaning this test can be used) for the duration of the COVID-19 declaration under Section 564(b)(1) of the Act, 21 U.S.C. section 360bbb-3(b)(1), unless the authorization is terminated or revoked sooner.  Performed at [facility], [place] [address], [place], [place] [address]      Radiology Studies: DG Chest Port 1 View  Result Date: 06/17/2022 CLINICAL DATA:  Shortness of breath EXAM: PORTABLE CHEST 1 VIEW COMPARISON:  Chest radiograph done on 04/23/2007, CT done on 06/03/2022 FINDINGS: Transverse diameter of heart is increased. There is prominence of interstitial markings in the parahilar regions and lower lung fields. There is no focal consolidation. Lateral CP angles are clear. There is no pneumothorax. IMPRESSION: Cardiomegaly. There is prominence of interstitial markings in the parahilar  regions and lower lung fields suggesting possible mild interstitial edema or interstitial pneumonia. Electronically Signed   By: Elmer Picker M.D.   On: 06/17/2022 18:06   CT Abdomen Pelvis W Contrast  Result Date: 06/17/2022 CLINICAL DATA:  Nausea and vomiting. 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 EXAM: CT ABDOMEN AND PELVIS WITH CONTRAST TECHNIQUE: Multidetector CT imaging of the abdomen and pelvis was performed using the standard protocol following bolus administration of intravenous contrast. RADIATION DOSE REDUCTION: This exam was performed according to the departmental dose-optimization program which includes automated exposure control, adjustment of the mA and/or kV according to patient size and/or use of iterative reconstruction technique. CONTRAST:  46m OMNIPAQUE IOHEXOL 300 MG/ML  SOLN COMPARISON:  CT 05/30/2022 FINDINGS: Lower chest: Small bilateral pleural effusions greater on the RIGHT. Mild basilar atelectasis. Hepatobiliary: No focal hepatic lesion. No biliary duct dilatation. Common bile duct is normal. Pancreas: Pancreas is normal. No ductal dilatation. No pancreatic inflammation. Spleen: Normal spleen Adrenals/urinary tract: Adrenal glands and kidneys are normal. The ureters and bladder normal. Stomach/Bowel: Stomach, duodenum small-bowel normal. Post RIGHT hemicolectomy. Enteric colonic anastomosis in the RIGHT upper quadrant. There is mild inflammatory stranding within the RIGHT upper quadrant mesentery related to recent surgery. No organized fluid collections. The transverse and descending colon are collapsed. No acute findings. Rectosigmoid colon normal. Vascular/Lymphatic: Abdominal aorta is normal caliber with atherosclerotic calcification. There is no retroperitoneal or periportal lymphadenopathy. No pelvic lymphadenopathy. Reproductive: Uterus and adnexa unremarkable. Other: Midline incision noted. Skin staples at the skin surface. There is soft tissue  thickening along the incision. No organized fluid collections (image 49/3) Musculoskeletal: No acute osseous abnormality. IMPRESSION: 1. Post RIGHT hemicolectomy. Expected postsurgical change in the RIGHT upper quadrant. No organized fluid collections. No bowel obstruction or inflammation. 2. Midline ventral wound without complicating features. 3. Bilateral small pleural effusions greater on the RIGHT. Electronically Signed   By: SSuzy BouchardM.D.   On: 06/17/2022 16:28     Scheduled Meds:  apixaban  5 mg Oral BID   flecainide  50 mg Oral Q12H   furosemide  40 mg Oral BID   gabapentin  400 mg Oral QHS   irbesartan  150 mg Oral Daily   levothyroxine  75 mcg Oral Q breakfast   metoprolol succinate  50 mg Oral Daily   pantoprazole  40 mg Oral Daily   potassium chloride  40 mEq Oral BID   ramelteon  8 mg Oral QHS   spironolactone  25 mg Oral Daily   traZODone  50 mg Oral QHS   venlafaxine XR  225 mg Oral Daily   Continuous Infusions:   LOS: 0 days    Time spent: 359m  Domenic Polite, MD [facility]   06/19/2022, 11:20 AM

## 2022-06-19 NOTE — Telephone Encounter (Signed)
Verbal order given, but at this point she is getting admitted again, so I am not sure if we are going to continue the valsartan.  Hold off on additional home health nursing orders until I can see the patient after hospital discharge.

## 2022-06-19 NOTE — Evaluation (Signed)
Physical Therapy Evaluation Patient Details Name: [name] MRN: [id number] DOB: 09/15/1951 Today's Date: 06/19/2022  History of Present Illness  71 y.o. female presents to the ER 06/17/22 because of nausea, vomiting, diarrhea, and SOB. Patient stopped taking lasix 4 days prior. Patient was recently admitted for ischemic colitis and during the work-up was found to have colon mass underwent hemicolectomy was discharged home about 5 days PTA. CT abd 8/18 negative for acute changes; CXR +pulmonary edema with acute on chronic CHF   PMH--atrial fibrillation status post ablation, hypothyroidism  Clinical Impression   Pt admitted secondary to problem above with deficits below. PTA patient was living with her son and ambulating modified independent with rollator. She required assistance on the stairs. Pt currently requires min guard assist for OOB to chair with ambulation deferred due to orthostasis. Of note, pt reported the day she came to hospital she woke up with spinning dizziness, vomiting and diarrhea. The spinning dizziness has persisted and is present constantly. RN made aware and relayed message to MD.  Anticipate patient will benefit from PT to address problems listed below.Will continue to follow acutely to maximize functional mobility independence and safety.          Recommendations for follow up therapy are one component of a multi-disciplinary discharge planning process, led by the attending physician.  Recommendations may be updated based on patient status, additional functional criteria and insurance authorization.  Follow Up Recommendations Home health PT      Assistance Recommended at Discharge Frequent or constant Supervision/Assistance  Patient can return home with the following  A little help with walking and/or transfers;A little help with bathing/dressing/bathroom;Assistance with cooking/housework;Assist for transportation;Help with stairs or ramp for entrance    Equipment  Recommendations None recommended by PT  Recommendations for Other Services       Functional Status Assessment Patient has had a recent decline in their functional status and demonstrates the ability to make significant improvements in function in a reasonable and predictable amount of time.     Precautions / Restrictions Precautions Precautions: Fall Restrictions Weight Bearing Restrictions: No      Mobility  Bed Mobility Overal bed mobility: Needs Assistance Bed Mobility: Supine to Sit     Supine to sit: Supervision, HOB elevated     General bed mobility comments: no assist or cues needed, pt taking extra time    Transfers Overall transfer level: Needs assistance Equipment used: Rolling walker (2 wheels) Transfers: Sit to/from Stand, Bed to chair/wheelchair/BSC Sit to Stand: Min guard Stand pivot transfers: Min guard         General transfer comment: minguard for safety as pt reporting mild spinning dizziness throughout session    Ambulation/Gait               General Gait Details: deferred due to drop in BP and dizziness  Stairs            Wheelchair Mobility    Modified Rankin (Stroke Patients Only)       Balance Overall balance assessment: Needs assistance Sitting-balance support: Feet supported Sitting balance-Leahy Scale: Fair     Standing balance support: Bilateral upper extremity supported, No upper extremity supported, During functional activity Standing balance-Leahy Scale: Poor Standing balance comment: balance improved with RW for support                             Pertinent Vitals/Pain Pain Assessment Pain Assessment: No/denies pain  Home Living Family/patient expects to be discharged to:: Private residence Living Arrangements: Children Available Help at Discharge: Family;Available PRN/intermittently Type of Home: House Home Access: Level entry     Alternate Level Stairs-Number of Steps: 8 steps (spli  tlevel home) Home Layout: Multi-level Home Equipment: Rollator (4 wheels);Shower seat;BSC/3in1 Additional Comments: has family assist for home    Prior Function Prior Level of Function : Needs assist             Mobility Comments: rollator at most for gait support modified independent; assist on steps ADLs Comments: Pt independent. Assist with iADL.     Hand Dominance   Dominant Hand: Right    Extremity/Trunk Assessment   Upper Extremity Assessment Upper Extremity Assessment: Generalized weakness    Lower Extremity Assessment Lower Extremity Assessment: Generalized weakness RLE Deficits / Details: edema LLE Deficits / Details: edema    Cervical / Trunk Assessment Cervical / Trunk Assessment: Other exceptions Cervical / Trunk Exceptions: overweight  Communication   Communication: No difficulties  Cognition Arousal/Alertness: Awake/alert Behavior During Therapy: WFL for tasks assessed/performed Overall Cognitive Status: Within Functional Limits for tasks assessed                                          General Comments General comments (skin integrity, edema, etc.): see vitals flowsheet for orthostatic BPs (+)    Exercises     Assessment/Plan    PT Assessment Patient needs continued PT services  PT Problem List Decreased strength;Decreased activity tolerance;Decreased balance;Decreased coordination;Decreased safety awareness;Cardiopulmonary status limiting activity;Decreased mobility;Decreased knowledge of use of DME;Obesity       PT Treatment Interventions DME instruction;Gait training;Stair training;Functional mobility training;Therapeutic activities;Therapeutic exercise;Balance training;Neuromuscular re-education;Patient/family education    PT Goals (Current goals can be found in the Care Plan section)  Acute Rehab PT Goals Patient Stated Goal: to get home and feel better PT Goal Formulation: With patient Time For Goal Achievement:  07/03/22 Potential to Achieve Goals: Good    Frequency Min 3X/week     Co-evaluation               AM-PAC PT "6 Clicks" Mobility  Outcome Measure Help needed turning from your back to your side while in a flat bed without using bedrails?: A Little Help needed moving from lying on your back to sitting on the side of a flat bed without using bedrails?: A Little Help needed moving to and from a bed to a chair (including a wheelchair)?: A Little Help needed standing up from a chair using your arms (e.g., wheelchair or bedside chair)?: A Little Help needed to walk in hospital room?: Total Help needed climbing 3-5 steps with a railing? : Total 6 Click Score: 14    End of Session Equipment Utilized During Treatment: Gait belt;Oxygen Activity Tolerance: Treatment limited secondary to medical complications (Comment) (dizziness) Patient left: in chair;with call bell/phone within reach;with chair alarm set Nurse Communication: Mobility status;Other (comment) (orthostatic; constant dizziness) PT Visit Diagnosis: Unsteadiness on feet (R26.81);Muscle weakness (generalized) (M62.81);Dizziness and giddiness (R42)    Time: 4166-0630 PT Time Calculation (min) (ACUTE ONLY): 30 min   Charges:   PT Evaluation $PT Eval Low Complexity: 1 Low PT Treatments $Therapeutic Activity: 8-22 mins         Arby Barrette, PT Acute Rehabilitation Services  Office [phone]   Rexanne Mano 06/19/2022, 2:42 PM

## 2022-06-19 NOTE — Progress Notes (Signed)
Patient is symptomatic with orthostatic vitals today, continued dizziness, per Dr Broadus John, we are holding the lasix and potassium PO meds for the remainder of today, patient is aware

## 2022-06-20 ENCOUNTER — Encounter (HOSPITAL_COMMUNITY): Payer: Self-pay | Admitting: Internal Medicine

## 2022-06-20 DIAGNOSIS — I5033 Acute on chronic diastolic (congestive) heart failure: Secondary | ICD-10-CM | POA: Diagnosis not present

## 2022-06-20 LAB — BASIC METABOLIC PANEL
Anion gap: 12 (ref 5–15)
BUN: 13 mg/dL (ref 8–23)
CO2: 33 mmol/L — ABNORMAL HIGH (ref 22–32)
Calcium: 9.4 mg/dL (ref 8.9–10.3)
Chloride: 93 mmol/L — ABNORMAL LOW (ref 98–111)
Creatinine, Ser: 0.84 mg/dL (ref 0.44–1.00)
GFR, Estimated: >60 mL/min (ref >60)
Glucose, Bld: 119 mg/dL — ABNORMAL HIGH (ref 70–99)
Potassium: 3.3 mmol/L — ABNORMAL LOW (ref 3.5–5.1)
Sodium: 138 mmol/L (ref 135–145)

## 2022-06-20 MED ORDER — IRBESARTAN 75 MG PO TABS
75.0000 mg | ORAL_TABLET | Freq: Every day | ORAL | Status: DC
  Administered 2022-06-20 – 2022-06-21 (×2): 75 mg via ORAL
  Filled 2022-06-20 (×2): qty 1

## 2022-06-20 MED ORDER — FUROSEMIDE 40 MG PO TABS
40.0000 mg | ORAL_TABLET | Freq: Every day | ORAL | Status: DC
  Administered 2022-06-21: 40 mg via ORAL
  Filled 2022-06-20: qty 1

## 2022-06-20 MED ORDER — POTASSIUM CHLORIDE CRYS ER 20 MEQ PO TBCR
40.0000 meq | EXTENDED_RELEASE_TABLET | Freq: Two times a day (BID) | ORAL | Status: AC
Start: 2022-06-20 — End: 2022-06-20
  Administered 2022-06-20 (×2): 40 meq via ORAL
  Filled 2022-06-20 (×2): qty 2

## 2022-06-20 NOTE — Care Management (Signed)
  Transition of Care [facility]) Screening Note   Patient Details  Name: [name] Date of Birth: 06-03-1951   Transition of Care [facility]) CM/SW Contact:    Bethena Roys, RN Phone Number: 06/20/2022, 12:50 PM    Transition of Care Department [facility]) has reviewed patient and no TOC needs have been identified at this time. Patient is currently active with [facility] for RN,PT,OT in the [facility] area-serviced via the [employer] office. Patient will only need resumption orders for services if she qualifies for Inpatient Status. Patient is currently in Observation Status. Case Manager will continue to monitor patient advancement through interdisciplinary progression rounds. If new patient transition needs arise, please place a TOC consult.

## 2022-06-20 NOTE — Evaluation (Signed)
Occupational Therapy Evaluation Patient Details Name: [name] MRN: [id number] DOB: [birth year] Today's Date: 06/20/2022   History of Present Illness 71 y.o. female presents to the ER 06/17/22 because of nausea, vomiting, diarrhea, and SOB. Patient stopped taking lasix 4 days prior. Patient was recently admitted for ischemic colitis and during the work-up was found to have colon mass underwent hemicolectomy was discharged home about 5 days PTA. CT abd 8/18 negative for acute changes; CXR +pulmonary edema with acute on chronic CHF   PMH--atrial fibrillation status post ablation, hypothyroidism   Clinical Impression   Pt independent at baseline for ADLs and functional mobility, uses rollator. Pt lives with daughter who assists with transportation. Pt currently needing set up -min A for ADLs, and min guard for transfers with RW, pt reporting dizziness during session, orthostatic with BP 149/125 (133) sitting in chair, and 126/92 (99) after seated bath and transfer back to chair. Pt presenting with impairments listed below, will follow acutely. Recommend HHOT at d/c.     Recommendations for follow up therapy are one component of a multi-disciplinary discharge planning process, led by the attending physician.  Recommendations may be updated based on patient status, additional functional criteria and insurance authorization.   Follow Up Recommendations  Home health OT    Assistance Recommended at Discharge Intermittent Supervision/Assistance  Patient can return home with the following A little help with walking and/or transfers;A little help with bathing/dressing/bathroom;Assistance with cooking/housework;Direct supervision/assist for medications management;Direct supervision/assist for financial management;Assist for transportation;Help with stairs or ramp for entrance    Functional Status Assessment  Patient has had a recent decline in their functional status and demonstrates the ability to make  significant improvements in function in a reasonable and predictable amount of time.  Equipment Recommendations  BSC/3in1    Recommendations for Other Services PT consult     Precautions / Restrictions Precautions Precautions: Fall Restrictions Weight Bearing Restrictions: No      Mobility Bed Mobility               General bed mobility comments: OOB in chair upon arrival and departure    Transfers Overall transfer level: Needs assistance Equipment used: Rolling walker (2 wheels) Transfers: Sit to/from Stand, Bed to chair/wheelchair/BSC Sit to Stand: Min guard                  Balance Overall balance assessment: Needs assistance Sitting-balance support: Feet supported Sitting balance-Leahy Scale: Fair     Standing balance support: Bilateral upper extremity supported, No upper extremity supported, During functional activity Standing balance-Leahy Scale: Poor                             ADL either performed or assessed with clinical judgement   ADL Overall ADL's : Needs assistance/impaired Eating/Feeding: Set up   Grooming: Set up   Upper Body Bathing: Minimal assistance   Lower Body Bathing: Minimal assistance   Upper Body Dressing : Minimal assistance   Lower Body Dressing: Min guard   Toilet Transfer: Min guard;BSC/3in1;Ambulation;Rolling walker (2 wheels)   Toileting- Clothing Manipulation and Hygiene: Min guard       Functional mobility during ADLs: Min guard;Rolling walker (2 wheels)       Vision   Vision Assessment?: No apparent visual deficits Additional Comments: f     Perception     Praxis      Pertinent Vitals/Pain Pain Assessment Pain Assessment: No/denies pain  Hand Dominance Right   Extremity/Trunk Assessment Upper Extremity Assessment Upper Extremity Assessment: Generalized weakness   Lower Extremity Assessment Lower Extremity Assessment: Defer to PT evaluation   Cervical / Trunk  Assessment Cervical / Trunk Assessment: Other exceptions Cervical / Trunk Exceptions: overweight   Communication Communication Communication: No difficulties   Cognition Arousal/Alertness: Awake/alert Behavior During Therapy: WFL for tasks assessed/performed Overall Cognitive Status: Within Functional Limits for tasks assessed                                       General Comments  pt with continued dizziness during session BP Orthostatic but WNL, 149/125 (133) seated prior to transfer, 126/92 (99) after transfer    Exercises     Shoulder Instructions      Home Living Family/patient expects to be discharged to:: Private residence Living Arrangements: Children Available Help at Discharge: Family;Available PRN/intermittently Type of Home: House Home Access: Level entry     Home Layout: Multi-level Alternate Level Stairs-Number of Steps: 8 steps (spli tlevel home) Alternate Level Stairs-Rails: Right Bathroom Shower/Tub: Tub/shower unit   Bathroom Toilet: Handicapped height     Home Equipment: Rollator (4 wheels);Shower seat;BSC/3in1   Additional Comments: has family assist for home      Prior Functioning/Environment Prior Level of Function : Needs assist             Mobility Comments: rollator at most for gait support modified independent; assist on steps ADLs Comments: Pt independent. Assist with transportation from daughter, manages own meds/bills        OT Problem List: Decreased strength;Decreased activity tolerance;Impaired balance (sitting and/or standing);Decreased safety awareness;Increased edema;Cardiopulmonary status limiting activity;Obesity      OT Treatment/Interventions: Self-care/ADL training;Therapeutic exercise;Energy conservation;DME and/or AE instruction;Therapeutic activities;Patient/family education;Balance training    OT Goals(Current goals can be found in the care plan section) Acute Rehab OT Goals Patient Stated Goal:  none stated OT Goal Formulation: With patient Time For Goal Achievement: 07/04/22 Potential to Achieve Goals: Good ADL Goals Pt Will Perform Upper Body Dressing: with modified independence;sitting Pt Will Perform Lower Body Dressing: with modified independence;sit to/from stand;sitting/lateral leans Pt Will Transfer to Toilet: with modified independence;ambulating;regular height toilet Pt Will Perform Tub/Shower Transfer: Shower transfer;Tub transfer;with modified independence;shower seat;ambulating;rolling walker Additional ADL Goal #2: Pt will be able to stand ~5 mins for task in order to improve activity tolerance for ADLs/mobility.  OT Frequency: Min 2X/week    Co-evaluation              AM-PAC OT "6 Clicks" Daily Activity     Outcome Measure Help from another person eating meals?: None Help from another person taking care of personal grooming?: A Little Help from another person toileting, which includes using toliet, bedpan, or urinal?: A Little Help from another person bathing (including washing, rinsing, drying)?: A Little Help from another person to put on and taking off regular upper body clothing?: A Little Help from another person to put on and taking off regular lower body clothing?: A Little 6 Click Score: 19   End of Session Equipment Utilized During Treatment: Gait belt Nurse Communication: Mobility status  Activity Tolerance: Patient tolerated treatment well Patient left: in chair;with call bell/phone within reach;with chair alarm set  OT Visit Diagnosis: Unsteadiness on feet (R26.81);Pain                Time: 4650-3546 OT Time Calculation (min): 29  min Charges:  OT General Charges $OT Visit: 1 Visit OT Evaluation $OT Eval Low Complexity: 1 Low OT Treatments $Self Care/Home Management : 8-22 mins  Lynnda Child, OTD, OTR/L Acute Rehab [phone] - [place] 06/20/2022, 12:52 PM

## 2022-06-20 NOTE — Progress Notes (Signed)
Heart Failure Nurse Navigator Progress Note  PCP: Silverio Decamp, MD PCP-Cardiologist: None Admission Diagnosis: Nausea, vomiting, Diarrhea, Generalized abdominal pain.  Admitted from: Home via [employer]  Presentation:   [name] presented with shortness of breath, abdominal pain, nausea, vomiting, and diarrhea, after 1 week ago hemicolectomy with tumor removal. BP 149/79, HR 75, surgical wounds C/D/I. EKG showed NSR, BNP 400, CXR showed pulmonary edema,   Patient was educated on the sign and symptoms of heart failure, daily weights, when to call her doctor or go to the ER, Diet/ fluid restrictions, taking all her medications as prescribed, attending all medical appointments. Per Dr. Broadus John request, patient is scheduled for a HF TOC appointment on 07/11/2022 @ 12 noon.   ECHO/ LVEF: 60-65%  Clinical Course:  Past Medical History:  Diagnosis Date   Anxiety    Atrial fibrillation (HCC)    Depression    GERD (gastroesophageal reflux disease)    Hypertension    Plaque psoriasis    Thyroid disease      Social History   Socioeconomic History   Marital status: Widowed    Spouse name: Not on file   Number of children: 2   Years of education: Not on file   Highest education level: Associate degree: occupational, Hotel manager, or vocational program  Occupational History   Occupation: retired  Tobacco Use   Smoking status: Never   Smokeless tobacco: Never  Vaping Use   Vaping Use: Never used  Substance and Sexual Activity   Alcohol use: Never   Drug use: Never   Sexual activity: Not on file  Other Topics Concern   Not on file  Social History Narrative   Not on file   Social Determinants of Health   Financial Resource Strain: Low Risk  (06/20/2022)   Overall Financial Resource Strain (CARDIA)    Difficulty of Paying Living Expenses: Not hard at all  Food Insecurity: No Food Insecurity (06/20/2022)   Hunger Vital Sign    Worried About Running Out of Food in the Last  Year: Never true    [place] in the Last Year: Never true  Transportation Needs: No Transportation Needs (06/20/2022)   PRAPARE - Hydrologist (Medical): No    Lack of Transportation (Non-Medical): No  Physical Activity: Inactive (07/07/2021)   Exercise Vital Sign    Days of Exercise per Week: 0 days    Minutes of Exercise per Session: 0 min  Stress: No Stress Concern Present (07/07/2021)   [place]    Feeling of Stress : Only a little  Social Connections: Moderately Isolated (07/07/2021)   Social Connection and Isolation Panel [NHANES]    Frequency of Communication with Friends and Family: More than three times a week    Frequency of Social Gatherings with Friends and Family: Once a week    Attends Religious Services: More than 4 times per year    Active Member of [employer] or Organizations: No    Attends Archivist Meetings: Never    Marital Status: Widowed   Education Assessment and Provision:  Detailed education and instructions provided on heart failure disease management including the following:  Signs and symptoms of Heart Failure When to call the physician Importance of daily weights Low sodium diet Fluid restriction Medication management Anticipated future follow-up appointments  Patient education given on each of the above topics.  Patient acknowledges understanding via teach back method  and acceptance of all instructions.  Education Materials:  "Living Better With Heart Failure" Booklet, HF zone tool, & Daily Weight Tracker Tool.  Patient has scale at home: yes Patient has pill box at home: NA    High Risk Criteria for Readmission and/or Poor Patient Outcomes: Heart failure hospital admissions (last 6 months): 0  No Show rate: 3 % Difficult social situation: NO Demonstrates medication adherence: Yes Primary Language: English Literacy level: Reading, writing,  and comprehension.   Barriers of Care:   Diet/ fluid restrictions Daily weights  Considerations/Referrals:   Referral made to Heart Failure Pharmacist Stewardship: Yes Referral made to Heart Failure CSW/NCM TOC: No Referral made to Heart & Vascular TOC clinic: Yes, per Dr. Broadus John, 07/11/2022 @ 12 noon.   Items for Follow-up on DC/TOC: Diet/ fluid restrictions Daily weights   Earnestine Leys, BSN, RN Heart Failure Leisure centre manager Chat Only

## 2022-06-20 NOTE — Plan of Care (Signed)

## 2022-06-20 NOTE — Progress Notes (Signed)
Physical Therapy Treatment Patient Details Name: [name] MRN: [id number] DOB: 06-Feb-1951 Today's Date: 06/20/2022   History of Present Illness 71 y.o. female presents to the ER 06/17/22 because of nausea, vomiting, diarrhea, and SOB. Patient stopped taking lasix 4 days prior. Patient was recently admitted for ischemic colitis and during the work-up was found to have colon mass underwent hemicolectomy was discharged home about 5 days PTA. CT abd 8/18 negative for acute changes; CXR +pulmonary edema with acute on chronic CHF   PMH--atrial fibrillation status post ablation, hypothyroidism    PT Comments    Pt admitted with above diagnosis. Pt with right posterior canal BPPV and treated with Epley maneuver. Pt also educated in some exercises as well.  Pt with orthostasis as well.  Will continue to treat pt as able.   Pt currently with functional limitations due to balance and endurance deficits. Pt will benefit from skilled PT to increase their independence and safety with mobility to allow discharge to the venue listed below.      Recommendations for follow up therapy are one component of a multi-disciplinary discharge planning process, led by the attending physician.  Recommendations may be updated based on patient status, additional functional criteria and insurance authorization.  Follow Up Recommendations  Home health PT     Assistance Recommended at Discharge Frequent or constant Supervision/Assistance  Patient can return home with the following A little help with walking and/or transfers;A little help with bathing/dressing/bathroom;Assistance with cooking/housework;Assist for transportation;Help with stairs or ramp for entrance   Equipment Recommendations  None recommended by PT    Recommendations for Other Services       Precautions / Restrictions Precautions Precautions: Fall Precaution Comments: abdominal for comfort Restrictions Weight Bearing Restrictions: No      Mobility  Bed Mobility               General bed mobility comments: OOB in chair upon arrival and departure    Transfers Overall transfer level: Needs assistance Equipment used: Rolling walker (2 wheels) Transfers: Sit to/from Stand, Bed to chair/wheelchair/BSC Sit to Stand: Min guard Stand pivot transfers: Min guard         General transfer comment: minguard for safety with use of walker.    Ambulation/Gait                   Stairs             Wheelchair Mobility    Modified Rankin (Stroke Patients Only)       Balance Overall balance assessment: Needs assistance Sitting-balance support: Feet supported Sitting balance-Leahy Scale: Fair     Standing balance support: Bilateral upper extremity supported, No upper extremity supported, During functional activity Standing balance-Leahy Scale: Poor Standing balance comment: balance improved with RW for support                            Cognition Arousal/Alertness: Awake/alert Behavior During Therapy: WFL for tasks assessed/performed Overall Cognitive Status: Within Functional Limits for tasks assessed                                          Exercises Other Exercises Other Exercises: 94%RA, 69 bpm, sitting 126/92; standing 75 bpm, 102/77; standing after 3 min 77bpm, 135/76; At end of treatment 71 bpm, 129/76, 71 bpm. Other Exercises: Gave HEP  for vertigo -[id number] - Self Epley right, Nestor Lewandowsky, rolling habituation as well as explanation of BPPV    General Comments General comments (skin integrity, edema, etc.): Pt positive for right posterior canal BPPV and treated with Epley.  Pt reports feeling better after the treatment.  Also slight orthostasis continues.      Pertinent Vitals/Pain Pain Assessment Pain Assessment: No/denies pain    Home Living Family/patient expects to be discharged to:: Private residence Living Arrangements: Children Available Help  at Discharge: Family;Available PRN/intermittently Type of Home: House Home Access: Level entry     Alternate Level Stairs-Number of Steps: 8 steps (spli tlevel home) Home Layout: Multi-level Home Equipment: Rollator (4 wheels);Shower seat;BSC/3in1 Additional Comments: has family assist for home    Prior Function            PT Goals (current goals can now be found in the care plan section) Progress towards PT goals: Progressing toward goals    Frequency    Min 3X/week      PT Plan Current plan remains appropriate    Co-evaluation              AM-PAC PT "6 Clicks" Mobility   Outcome Measure  Help needed turning from your back to your side while in a flat bed without using bedrails?: A Little Help needed moving from lying on your back to sitting on the side of a flat bed without using bedrails?: A Little Help needed moving to and from a bed to a chair (including a wheelchair)?: A Little Help needed standing up from a chair using your arms (e.g., wheelchair or bedside chair)?: A Little Help needed to walk in hospital room?: A Little Help needed climbing 3-5 steps with a railing? : Total 6 Click Score: 16    End of Session Equipment Utilized During Treatment: Gait belt Activity Tolerance: Treatment limited secondary to medical complications (Comment);Patient limited by fatigue (dizziness) Patient left: in chair;with call bell/phone within reach;with chair alarm set Nurse Communication: Mobility status;Other (comment) (orthostatic; constant dizziness) PT Visit Diagnosis: Unsteadiness on feet (R26.81);Muscle weakness (generalized) (M62.81);Dizziness and giddiness (R42)     Time: 5409-8119 PT Time Calculation (min) (ACUTE ONLY): 30 min  Charges:  $Therapeutic Exercise: 8-22 mins $Self Care/Home Management: 8-22                     [facility] M,PT Acute Rehab Services [place] 06/20/2022, 3:16 PM

## 2022-06-20 NOTE — Progress Notes (Signed)
PROGRESS NOTE    [name]  ZYS:[id number] DOB: Nov 26, 1950 DOA: 06/17/2022 PCP: Silverio Decamp, MD  71 y.o. female with history of atrial fibrillation status post ablation, hypothyroidism who was recently admitted for ischemic colitis and during the work-up was found to have colon mass underwent hemicolectomy, path was negative for malignancy was discharged home about 5 days ago presented to the ER because of nausea vomiting diarrhea ongoing 8/18 a.m.  Patient also was feeling short of breath.  Patient was discharged on Lasix which patient stopped taking about 4 days ago.   ED Course: CT abdomen pelvis was unremarkable chest x-ray shows features concerning for pulmonary edema  Subjective: -Denies any dyspnea, complains of dizziness  Assessment and Plan:  Acute on chronic diastolic CHF - last EF measured was 60 to 65% last week. -Diuresed with IV Lasix , 2.5L negative, creatinine stable at 0.8  -Appears euvolemic , continue Po lasix and aldactone -Reports orthostatic symptoms, -History of UTIs, will avoid SGLT2i, continue ARB, decrease dose -increase activity -Ambulate, PT OT eval  Nausea vomiting, diarrhea -Appears to have improved, likely related to postop state -CT abdomen pelvis on admission largely unremarkable -Advance diet  Lethargy -Secondary to meds last night for sleep -Improved, gabapentin dose decreased  Paroxysmal atrial fibrillation -History of ablation, continue Toprol, flecainide, Eliquis  Recent ischemic colitis, colon mass -Underwent right hemicolectomy, path negative for malignancy -Routine wound care, follow-up with [facility]  Acute blood loss anemia -Hemoglobin is stable  Hypertension -Continue Toprol and ARB, DC HCTZ  Hypothyroidism -Continue Synthroid   DVT prophylaxis: Eliquis. Code Status: Full code. Family Communication: Discussed with patient. Disposition Plan: Home.  Likely 1 to 2 days  Consultants:    Procedures:    Antimicrobials:    Objective: Vitals:   06/19/22 2021 06/20/22 0428 06/20/22 0600 06/20/22 0732  BP:  (!) 136/59  132/64  Pulse:  62  65  Resp:  18  16  Temp:  98.4 F (36.9 C)  98.7 F (37.1 C)  TempSrc:  Oral  Oral  SpO2: 92% 92%  92%  Weight:   94.2 kg   Height:        Intake/Output Summary (Last 24 hours) at 06/20/2022 1246 Last data filed at 06/20/2022 0001 Gross per 24 hour  Intake 120 ml  Output 200 ml  Net -80 ml   Filed Weights   06/18/22 0015 06/19/22 0320 06/20/22 0600  Weight: 99.8 kg 94.3 kg 94.2 kg    Examination:  General exam: AAOx3 HEENt: No JVD CVS: S1S2/RRR Lungs: soft, NT, BS present Abd: soft, NS, BS present, abdominal lap port sites with dressing Extremities: No edema Skin: No rashes on exposed skin Psychiatry:  Mood & affect appropriate.     Data Reviewed:   CBC: Recent Labs  Lab 06/17/22 1444 06/17/22 1754 06/18/22 0326 06/19/22 0453  WBC 5.3  --  5.6 4.5  NEUTROABS 4.4  --   --   --   HGB 10.2* 10.9* 9.5* 10.2*  HCT 33.5* 32.0* 30.3* 32.0*  MCV 97.1  --  94.4 95.2  PLT 251  --  257 932   Basic Metabolic Panel: Recent Labs  Lab 06/17/22 1444 06/17/22 1754 06/18/22 0326 06/19/22 0453 06/20/22 0713  NA 140 138 140 140 138  K 4.2 5.1 3.6 3.1* 3.3*  CL 106  --  102 93* 93*  CO2 25  --  27 35* 33*  GLUCOSE 155*  --  111* 105* 119*  BUN 12  --  7* 8 13  CREATININE 0.58  --  0.65 0.80 0.84  CALCIUM 9.2  --  9.2 9.2 9.4   GFR: Estimated Creatinine Clearance: 67 mL/min (by C-G formula based on SCr of 0.84 mg/dL). Liver Function Tests: Recent Labs  Lab 06/17/22 1444 06/18/22 0326  AST 45* 40  ALT 23 21  ALKPHOS 87 79  BILITOT 0.5 0.8  PROT 6.2* 6.0*  ALBUMIN 3.2* 3.0*   No results for input(s): "LIPASE", "AMYLASE" in the last 168 hours. No results for input(s): "AMMONIA" in the last 168 hours. Coagulation Profile: No results for input(s): "INR", "PROTIME" in the last 168 hours. Cardiac Enzymes: No results  for input(s): "CKTOTAL", "CKMB", "CKMBINDEX", "TROPONINI" in the last 168 hours. BNP (last 3 results) No results for input(s): "PROBNP" in the last 8760 hours. HbA1C: No results for input(s): "HGBA1C" in the last 72 hours. CBG: No results for input(s): "GLUCAP" in the last 168 hours. Lipid Profile: No results for input(s): "CHOL", "HDL", "LDLCALC", "TRIG", "CHOLHDL", "LDLDIRECT" in the last 72 hours. Thyroid Function Tests: No results for input(s): "TSH", "T4TOTAL", "FREET4", "T3FREE", "THYROIDAB" in the last 72 hours. Anemia Panel: No results for input(s): "VITAMINB12", "FOLATE", "FERRITIN", "TIBC", "IRON", "RETICCTPCT" in the last 72 hours. Urine analysis:    Component Value Date/Time   COLORURINE YELLOW 05/27/2022 1030   APPEARANCEUR CLEAR 05/27/2022 1030   LABSPEC 1.019 05/27/2022 1030   PHURINE 5.0 05/27/2022 1030   GLUCOSEU NEGATIVE 05/27/2022 1030   HGBUR NEGATIVE 05/27/2022 1030   BILIRUBINUR NEGATIVE 05/27/2022 1030   BILIRUBINUR negative 03/09/2021 1554   KETONESUR 5 (A) 05/27/2022 1030   PROTEINUR NEGATIVE 05/27/2022 1030   UROBILINOGEN 0.2 03/09/2021 1554   NITRITE NEGATIVE 05/27/2022 1030   LEUKOCYTESUR NEGATIVE 05/27/2022 1030   Sepsis Labs: '@LABRCNTIP'$ (procalcitonin:4,lacticidven:4)  ) Recent Results (from the past 240 hour(s))  SARS Coronavirus 2 by RT PCR (hospital order, performed in [place] hospital lab) *cepheid single result test* Anterior Nasal Swab     Status: None   Collection Time: 06/17/22  9:39 PM   Specimen: Anterior Nasal Swab  Result Value Ref Range Status   SARS Coronavirus 2 by RT PCR NEGATIVE NEGATIVE Final    Comment: (NOTE) SARS-CoV-2 target nucleic acids are NOT DETECTED.  The SARS-CoV-2 RNA is generally detectable in upper and lower respiratory specimens during the acute phase of infection. The lowest concentration of SARS-CoV-2 viral copies this assay can detect is 250 copies / mL. A negative result does not preclude SARS-CoV-2  infection and should not be used as the sole basis for treatment or other patient management decisions.  A negative result may occur with improper specimen collection / handling, submission of specimen other than nasopharyngeal swab, presence of viral mutation(s) within the areas targeted by this assay, and inadequate number of viral copies (<250 copies / mL). A negative result must be combined with clinical observations, patient history, and epidemiological information.  Fact Sheet for Patients:   https://www.patel.info/  Fact Sheet for Healthcare Providers: https://hall.com/  This test is not yet approved or  cleared by the [place] FDA and has been authorized for detection and/or diagnosis of SARS-CoV-2 by FDA under an Emergency Use Authorization (EUA).  This EUA will remain in effect (meaning this test can be used) for the duration of the COVID-19 declaration under Section 564(b)(1) of the Act, 21 U.S.C. section 360bbb-3(b)(1), unless the authorization is terminated or revoked sooner.  Performed at [facility], [place] [address], [place], [place] [address]  Radiology Studies: No results found.   Scheduled Meds:  apixaban  5 mg Oral BID   flecainide  50 mg Oral Q12H   [START ON 06/21/2022] furosemide  40 mg Oral Daily   gabapentin  400 mg Oral QHS   irbesartan  75 mg Oral Daily   levothyroxine  75 mcg Oral Q breakfast   metoprolol succinate  50 mg Oral Daily   pantoprazole  40 mg Oral Daily   potassium chloride  40 mEq Oral BID   ramelteon  8 mg Oral QHS   spironolactone  25 mg Oral Daily   traZODone  50 mg Oral QHS   venlafaxine XR  225 mg Oral Daily   Continuous Infusions:   LOS: 0 days    Time spent: 76mn  PDomenic Polite MD [facility]   06/20/2022, 12:46 PM

## 2022-06-21 ENCOUNTER — Other Ambulatory Visit (HOSPITAL_COMMUNITY): Payer: Self-pay

## 2022-06-21 DIAGNOSIS — I5033 Acute on chronic diastolic (congestive) heart failure: Secondary | ICD-10-CM | POA: Diagnosis not present

## 2022-06-21 LAB — BASIC METABOLIC PANEL
Anion gap: 15 (ref 5–15)
BUN: 15 mg/dL (ref 8–23)
CO2: 25 mmol/L (ref 22–32)
Calcium: 9.9 mg/dL (ref 8.9–10.3)
Chloride: 99 mmol/L (ref 98–111)
Creatinine, Ser: 0.91 mg/dL (ref 0.44–1.00)
GFR, Estimated: >60 mL/min (ref >60)
Glucose, Bld: 149 mg/dL — ABNORMAL HIGH (ref 70–99)
Potassium: 4 mmol/L (ref 3.5–5.1)
Sodium: 139 mmol/L (ref 135–145)

## 2022-06-21 MED ORDER — SPIRONOLACTONE 25 MG PO TABS
25.0000 mg | ORAL_TABLET | Freq: Every day | ORAL | 0 refills | Status: DC
  Filled 2022-06-21: qty 30, 30d supply, fill #0

## 2022-06-21 MED ORDER — GABAPENTIN 600 MG PO TABS: 300.0000 mg | ORAL_TABLET | Freq: Every day | ORAL | Status: DC

## 2022-06-21 MED ORDER — IRBESARTAN 75 MG PO TABS
75.0000 mg | ORAL_TABLET | Freq: Every day | ORAL | 0 refills | Status: DC
  Filled 2022-06-21: qty 30, 30d supply, fill #0

## 2022-06-21 MED ORDER — METOPROLOL SUCCINATE ER 50 MG PO TB24
50.0000 mg | ORAL_TABLET | Freq: Every day | ORAL | 0 refills | Status: DC
  Filled 2022-06-21: qty 30, 30d supply, fill #0

## 2022-06-21 NOTE — Discharge Instructions (Signed)

## 2022-06-21 NOTE — Progress Notes (Addendum)
Heart Failure Stewardship Pharmacist Progress Note   PCP: Silverio Decamp, MD PCP-Cardiologist: None    HPI:  71 yo F with PMH of afib, HTN, HLD, GERD, and hypothyroidism. Recently admitted form 7/27-8/14 for ischemic colitis and found to have colon mass. S/p bowel resection and tumor removal. No evidence of malignancy on pathology.   Presented to the ED on 8/18 with abdominal pain, N/V/D, and shortness of breath. CXR with cardiomegaly and possible mild interstitial edema. ECHO on 8/6 with LVEF 60-65%, no wall motion abnormalities, mild LVH, normal RV (moderately enlarged), and mild MR.  Current HF Medications: Diuretic: furosemide 40 mg PO daily Beta Blocker: metoprolol XL 50 mg daily ACE/ARB/ARNI: irbesartan 75 mg daily MRA: spironolactone 25 mg daily  Prior to admission HF Medications: Diuretic: furosemide 40 mg daily Beta blocker: metoprolol XL 25 mg BID ACE/ARB/ARNI: valsartan 160 mg daily  Pertinent Lab Values: Serum creatinine 0.84, BUN 13, Potassium 3.3, Sodium 138, BNP 412  Vital Signs: Weight: 206 lbs (admission weight: 220 lbs) Blood pressure: 150/80s  Heart rate: 60-70s  I/O: -68m yesterday; net -2.7L  Medication Assistance / Insurance Benefits Check: Does the patient have prescription insurance?  Yes Type of insurance plan: [facility]  Outpatient Pharmacy:  [facility] to admission [facility]? Yes Is the patient willing to transition their outpatient pharmacy [facility]?   Pending    Assessment: 1. Acute on chronic diastolic CHF (LVEF 699-37%. NYHA class II symptoms. - Continue furosemide 40 mg PO daily. K low, replaced with 40 mEq x 2 doses. - Continue metoprolol XL 50 mg daily - On irbesartan 75 mg daily (taking valsartan PTA). Consider optimizing to Entresto 24/26 mg BID with HFpEF.  - Continue spironolactone 25 mg daily - No SGLT2i  with history of UTIs - On flecainide PTA. With normal EF, no wall motion abnormalities, and only mild LVH, reasonable to continue in regard to structural disease.    Plan: 1) Medication changes recommended at this time: - Stop irbesartan and start Entresto 24/26 mg BID if orthostasis improves  2) Patient assistance: - Entresto copay $0  3)  Education  - To be completed prior to discharge  MKerby Nora PharmD, BCPS Heart Failure Stewardship Pharmacist Phone ([phone]

## 2022-06-21 NOTE — Progress Notes (Signed)
Physical Therapy Treatment Patient Details Name: [name] MRN: [id number] DOB: 08-30-1951 Today's Date: 06/21/2022   History of Present Illness 71 y.o. female presents to the ER 06/17/22 because of nausea, vomiting, diarrhea, and SOB. Patient stopped taking lasix 4 days prior. Patient was recently admitted for ischemic colitis and during the work-up was found to have colon mass underwent hemicolectomy was discharged home about 5 days PTA. CT abd 8/18 negative for acute changes; CXR +pulmonary edema with acute on chronic CHF   PMH--atrial fibrillation status post ablation, hypothyroidism    PT Comments    Pt admitted with above diagnosis. Treated pt again for BPPV and pt reports that she feels like dizziness is almost completely resolved.  Pt happy and feels like she is so much better.  Pt will need to f/u with vestibular therapist at home as she still has some residual nystagmus however her symptoms are not keeping her from being mobile at this point. Will continue to follow acutely.  Pt currently with functional limitations due to balance and endurance  deficits. Pt will benefit from skilled PT to increase their independence and safety with mobility to allow discharge to the venue listed below.      Recommendations for follow up therapy are one component of a multi-disciplinary discharge planning process, led by the attending physician.  Recommendations may be updated based on patient status, additional functional criteria and insurance authorization.  Follow Up Recommendations  Home health PT (for vestibular rehab (transition to Outpt Vestibular eventually))     Assistance Recommended at Discharge Set up Supervision/Assistance  Patient can return home with the following A little help with walking and/or transfers;A little help with bathing/dressing/bathroom;Assistance with cooking/housework;Assist for transportation;Help with stairs or ramp for entrance   Equipment Recommendations  None  recommended by PT    Recommendations for Other Services       Precautions / Restrictions Precautions Precautions: Fall Precaution Comments: abdominal for comfort Restrictions Weight Bearing Restrictions: No     Mobility  Bed Mobility Overal bed mobility: Needs Assistance Bed Mobility: Supine to Sit     Supine to sit: Supervision, HOB elevated Sit to supine: Supervision        Transfers Overall transfer level: Needs assistance Equipment used: Rolling walker (2 wheels) Transfers: Sit to/from Stand, Bed to chair/wheelchair/BSC Sit to Stand: Min guard Stand pivot transfers: Min guard         General transfer comment: minguard for safety with use of walker.    Ambulation/Gait Ambulation/Gait assistance: Min guard, Supervision Gait Distance (Feet): 20 Feet Assistive device: Rolling walker (2 wheels), None Gait Pattern/deviations: Step-through pattern, Antalgic   Gait velocity interpretation: <1.8 ft/sec, indicate of risk for recurrent falls       Stairs             Wheelchair Mobility    Modified Rankin (Stroke Patients Only)       Balance Overall balance assessment: Needs assistance Sitting-balance support: Feet supported Sitting balance-Leahy Scale: Fair     Standing balance support: No upper extremity supported, During functional activity Standing balance-Leahy Scale: Fair Standing balance comment: can stand statically with out UE support                            Cognition Arousal/Alertness: Awake/alert Behavior During Therapy: WFL for tasks assessed/performed Overall Cognitive Status: Within Functional Limits for tasks assessed  Exercises Other Exercises Other Exercises: VSS Other Exercises: HEP for vertigo -[id number] - Self Epley right, Nestor Lewandowsky, rolling habituation as well as explanation of BPPV    General Comments General comments (skin integrity, edema,  etc.): Pt reports a tinge of dizziness after the treatment yesterday stating that it is much better. Did test and pt also appears to have right horizontal canal BPPV as well however pt did not feel that BBQ roll would be good for her abdomen and this PT agrees. She feels that her dizziness has improved so much she feels as if she will just do the exercises - Laruth Bouchard Daroff prn and f/u with Outpt PT if needed once abdomen is healed.  Pt still positive for right posterior canal BPPV and treated with Epley again to clear out residual crystals.      Pertinent Vitals/Pain Pain Assessment Pain Assessment: Faces Faces Pain Scale: Hurts little more Pain Location: abdomen Pain Descriptors / Indicators: Sore Pain Intervention(s): Limited activity within patient's tolerance, Monitored during session, Repositioned    Home Living                          Prior Function            PT Goals (current goals can now be found in the care plan section) Progress towards PT goals: Progressing toward goals    Frequency    Min 3X/week      PT Plan Current plan remains appropriate    Co-evaluation              AM-PAC PT "6 Clicks" Mobility   Outcome Measure  Help needed turning from your back to your side while in a flat bed without using bedrails?: A Little Help needed moving from lying on your back to sitting on the side of a flat bed without using bedrails?: A Little Help needed moving to and from a bed to a chair (including a wheelchair)?: A Little Help needed standing up from a chair using your arms (e.g., wheelchair or bedside chair)?: A Little Help needed to walk in hospital room?: A Little Help needed climbing 3-5 steps with a railing? : A Little 6 Click Score: 18    End of Session Equipment Utilized During Treatment: Gait belt Activity Tolerance: Patient tolerated treatment well Patient left: in chair;with call bell/phone within reach;with chair alarm set Nurse  Communication: Mobility status PT Visit Diagnosis: Unsteadiness on feet (R26.81);Muscle weakness (generalized) (M62.81);Dizziness and giddiness (R42)     Time: 0488-8916 PT Time Calculation (min) (ACUTE ONLY): 29 min  Charges:  $Therapeutic Activity: 8-22 mins $Canalith Rep Proc: 8-22 mins                     [facility] M,PT Acute Rehab Services [phone]    Alvira Philips 06/21/2022, 12:27 PM

## 2022-06-21 NOTE — TOC Transition Note (Signed)
Transition of Care [facility]) - CM/SW Discharge Note   Patient Details  Name: [name] MRN: [id number] Date of Birth: 18-Jun-1951  [facility] CM/SW Contact:  Zenon Mayo, RN Phone Number: 06/21/2022, 1:05 PM   Clinical Narrative:    Patient is for dc home today , NCM notified Kelly with [place] of dc today.           Patient Goals and CMS Choice        Discharge Placement                       Discharge Plan and Services                                     Social Determinants of Health (SDOH) Interventions Food Insecurity Interventions: Intervention Not Indicated Financial Strain Interventions: Intervention Not Indicated Housing Interventions: Intervention Not Indicated Transportation Interventions: Intervention Not Indicated   Readmission Risk Interventions    06/13/2022    1:44 PM  Readmission Risk Prevention Plan  Transportation Screening Complete  PCP or Specialist Appt within 3-5 Days Complete  HRI or [place] Complete  Social Work Consult for [place] Planning/Counseling Complete  Palliative Care Screening Not Applicable  Medication Review [employer] photographer) Complete

## 2022-06-21 NOTE — Progress Notes (Signed)
Unable to remove all staples because tissue has grown into them. Educated patient to follow up with her doctor's office today after she discharges from here. Pt is in agreement with leaving the rest of the staples for now and seeing her doctor.

## 2022-06-21 NOTE — Telephone Encounter (Signed)
LVM for [facility] to call to discuss orders.  Charyl Bigger, CMA

## 2022-06-21 NOTE — Progress Notes (Signed)
Occupational Therapy Treatment Patient Details Name: [name] MRN: [id number] DOB: 1951/03/09 Today's Date: 06/21/2022   History of present illness 71 y.o. female presents to the ER 06/17/22 because of nausea, vomiting, diarrhea, and SOB. Patient stopped taking lasix 4 days prior. Patient was recently admitted for ischemic colitis and during the work-up was found to have colon mass underwent hemicolectomy was discharged home about 5 days PTA. CT abd 8/18 negative for acute changes; CXR +pulmonary edema with acute on chronic CHF   PMH--atrial fibrillation status post ablation, hypothyroidism   OT comments  Pt progressing towards goals this session, completing UB dressing with min A and simulated toilet transfer with min guard A with RW. Pt reporting some mild dizziness, however able to complete ADL/mobility tasks. Pt BP WNL at end of session, walks short hallway distance with standing rest break x2. Pt presenting with impairments listed below, will follow acutely. Continue to recommend HHOT at d/c.   Recommendations for follow up therapy are one component of a multi-disciplinary discharge planning process, led by the attending physician.  Recommendations may be updated based on patient status, additional functional criteria and insurance authorization.    Follow Up Recommendations  Home health OT    Assistance Recommended at Discharge Intermittent Supervision/Assistance  Patient can return home with the following  A little help with walking and/or transfers;A little help with bathing/dressing/bathroom;Assistance with cooking/housework;Direct supervision/assist for medications management;Direct supervision/assist for financial management;Assist for transportation;Help with stairs or ramp for entrance   Equipment Recommendations  BSC/3in1    Recommendations for Other Services PT consult    Precautions / Restrictions Precautions Precautions: Fall Precaution Comments: abdominal for  comfort Restrictions Weight Bearing Restrictions: No       Mobility Bed Mobility               General bed mobility comments: sitting EOB upon arrival'    Transfers Overall transfer level: Needs assistance Equipment used: Rolling walker (2 wheels) Transfers: Sit to/from Stand, Bed to chair/wheelchair/BSC Sit to Stand: Min guard                 Balance Overall balance assessment: Needs assistance Sitting-balance support: Feet supported Sitting balance-Leahy Scale: Fair     Standing balance support: No upper extremity supported, During functional activity Standing balance-Leahy Scale: Fair                             ADL either performed or assessed with clinical judgement   ADL Overall ADL's : Needs assistance/impaired                 Upper Body Dressing : Minimal assistance;Sitting       Toilet Transfer: Min guard;Ambulation;Regular Toilet;Rolling walker (2 wheels)           Functional mobility during ADLs: Min guard;Rolling walker (2 wheels)      Extremity/Trunk Assessment Upper Extremity Assessment Upper Extremity Assessment: Generalized weakness   Lower Extremity Assessment Lower Extremity Assessment: Defer to PT evaluation        Vision   Vision Assessment?: No apparent visual deficits   Perception Perception Perception: Not tested   Praxis Praxis Praxis: Not tested    Cognition Arousal/Alertness: Awake/alert Behavior During Therapy: WFL for tasks assessed/performed Overall Cognitive Status: Within Functional Limits for tasks assessed  Exercises      Shoulder Instructions       General Comments VSS on RA, BP 141/87 (103) after session    Pertinent Vitals/ Pain       Pain Assessment Pain Assessment: Faces Pain Score: 2  Faces Pain Scale: Hurts a little bit Pain Location: abdomen Pain Descriptors / Indicators: Sore Pain Intervention(s):  Limited activity within patient's tolerance  Home Living                                          Prior Functioning/Environment              Frequency  Min 2X/week        Progress Toward Goals  OT Goals(current goals can now be found in the care plan section)  Progress towards OT goals: Progressing toward goals  Acute Rehab OT Goals Patient Stated Goal: none stated OT Goal Formulation: With patient Time For Goal Achievement: 07/04/22 Potential to Achieve Goals: Good ADL Goals Pt Will Perform Upper Body Dressing: with modified independence;sitting Pt Will Perform Lower Body Dressing: with modified independence;sit to/from stand;sitting/lateral leans Pt Will Transfer to Toilet: with modified independence;ambulating;regular height toilet Pt Will Perform Tub/Shower Transfer: Shower transfer;Tub transfer;with modified independence;shower seat;ambulating;rolling walker Additional ADL Goal #1: Pt will increase to x10 mins of OOB ADL tasks with O2 >90%. Additional ADL Goal #2: Pt will be able to stand ~5 mins for task in order to improve activity tolerance for ADLs/mobility.  Plan Discharge plan remains appropriate    Co-evaluation                 AM-PAC OT "6 Clicks" Daily Activity     Outcome Measure   Help from another person eating meals?: None Help from another person taking care of personal grooming?: A Little Help from another person toileting, which includes using toliet, bedpan, or urinal?: A Little Help from another person bathing (including washing, rinsing, drying)?: A Little Help from another person to put on and taking off regular upper body clothing?: A Little Help from another person to put on and taking off regular lower body clothing?: A Little 6 Click Score: 19    End of Session Equipment Utilized During Treatment: Gait belt;Rolling walker (2 wheels)  OT Visit Diagnosis: Unsteadiness on feet (R26.81);Pain   Activity Tolerance  Patient tolerated treatment well   Patient Left in bed;with call bell/phone within reach (sitting EOB)   Nurse Communication Mobility status        Time: 4540-9811 OT Time Calculation (min): 17 min  Charges: OT General Charges $OT Visit: 1 Visit OT Treatments $Self Care/Home Management : 8-22 mins  Lynnda Child, OTD, OTR/L Acute Rehab [phone] [place] 06/21/2022, 2:12 PM

## 2022-06-21 NOTE — Discharge Summary (Addendum)
Physician Discharge Summary  [name] TGG:[id number] DOB: [birth year] DOA: 06/17/2022  PCP: Silverio Decamp, MD  Admit date: 06/17/2022 Discharge date: 06/21/2022  Time spent: 35  minutes  Recommendations for Outpatient Follow-up:  [facility] clinic 07/11/22 Cardiology at [facility] in 1 month PCP in 1 week, please check BMP at follow-up   Discharge Diagnoses:  Principal Problem:   Acute on chronic diastolic CHF (congestive heart failure) ([place]) Recent right hemicolectomy Ischemic colitis BPPV Orthostatic hypotension   Paroxysmal atrial fibrillation (HCC)   Hypothyroidism   Nausea vomiting and diarrhea   CHF (congestive heart failure) ([place])   Discharge Condition: Stable  Diet recommendation:, Heart healthy  Filed Weights   06/19/22 0320 06/20/22 0600 06/21/22 0425  Weight: 94.3 kg 94.2 kg 93.8 kg    History of present illness:   71 y.o. female with history of atrial fibrillation status post ablation, hypothyroidism who was recently admitted for ischemic colitis and during the work-up was found to have colon mass underwent hemicolectomy, path was negative for malignancy was discharged home about 5 days ago presented to the ER because of nausea vomiting diarrhea ongoing 8/18 a.m.  Patient also was feeling short of breath.  Patient was discharged on Lasix which patient stopped taking about 4 days ago.  Hospital Course:  Acute on chronic diastolic CHF - last EF measured was 60 to 65% last week. -Diuresed with IV Lasix , 3.5L negative, creatinine stable  -Now appears euvolemic , continue Po lasix and aldactone -Had positive orthostatics as well hence did not add further GDMT -Started ARB, educated about salt restriction, daily weights -CHF TOC clinic follow-up arranged, consider further GDMT as symptoms tolerate -Patient has a cardiologist at [place], [place]   Nausea vomiting, diarrhea -Appears to have improved, likely related to postop state -CT abdomen pelvis on  admission largely unremarkable -Advance diet   Lethargy -Secondary to meds last night for sleep and high-dose gabapentin -Improved, gabapentin dose decreased   Paroxysmal atrial fibrillation -History of ablation, continue Toprol, flecainide, Eliquis   Recent ischemic colitis, colon mass -Underwent right hemicolectomy, path negative for malignancy -Routine wound care, follow-up with [facility], staples removed   Acute blood loss anemia -Hemoglobin is stable   Hypertension -Continue Toprol and ARB, DC HCTZ   Hypothyroidism -Continue Synthroid   Discharge Exam: Vitals:   06/20/22 2016 06/21/22 0425  BP: 130/68 (!) 151/87  Pulse: 67 69  Resp: 20 16  Temp: 98.3 F (36.8 C) 98.1 F (36.7 C)  SpO2: 92% 93%    Gen: Awake, Alert, Oriented X 3,  HEENT: no JVD Lungs: Good air movement bilaterally, CTAB CVS: S1S2/RRR Abd: soft, Non tender, non distended, BS present Extremities: No edema Skin: no new rashes on exposed skin   Discharge Instructions   Discharge Instructions     Diet - low sodium heart healthy   Complete by: As directed    Discharge wound care:   Complete by: As directed    routine   Increase activity slowly   Complete by: As directed       Allergies as of 06/21/2022   No Known Allergies      Medication List     STOP taking these medications    valsartan-hydrochlorothiazide 320-25 MG tablet Commonly known as: DIOVAN-HCT       TAKE these medications    acetaminophen 500 MG tablet Commonly known as: TYLENOL Take 1 tablet (500 mg total) by mouth every 6 (six) hours as needed.   AMBULATORY NON FORMULARY  MEDICATION Medication Name: Rollator   AMBULATORY NON FORMULARY MEDICATION Motorized scooter for use daily   AMBULATORY NON FORMULARY MEDICATION Shower seat for use daily   apixaban 5 MG Tabs tablet Commonly known as: ELIQUIS Take 1 tablet (5 mg total) by mouth 2 (two) times daily.   flecainide 50 MG tablet Commonly known as:  TAMBOCOR Take 50 mg by mouth 2 (two) times a day.   furosemide 40 MG tablet Commonly known as: LASIX Take 1 tablet (40 mg total) by mouth daily.   gabapentin 600 MG tablet Commonly known as: NEURONTIN Take 0.5 tablets (300 mg total) by mouth at bedtime. What changed: how much to take   icosapent Ethyl 1 g capsule Commonly known as: VASCEPA Take 2 capsules (2 g total) by mouth 2 (two) times daily.   irbesartan 75 MG tablet Commonly known as: AVAPRO Take 1 tablet (75 mg total) by mouth daily. Start taking on: June 22, 2022   levothyroxine 75 MCG tablet Commonly known as: SYNTHROID Take 1 tablet (75 mcg total) by mouth daily with breakfast.   metoprolol succinate 50 MG 24 hr tablet Commonly known as: TOPROL-XL Take 1 tablet (50 mg total) by mouth daily. What changed:  medication strength how much to take when to take this   multivitamin with minerals tablet Take 1 tablet by mouth daily.   ondansetron 8 MG disintegrating tablet Commonly known as: ZOFRAN-ODT Take 1 tablet (8 mg total) by mouth every 8 (eight) hours as needed for nausea.   oxyCODONE 5 MG immediate release tablet Commonly known as: Oxy IR/ROXICODONE Take 1 tablet (5 mg total) by mouth every 6 (six) hours as needed for severe pain or moderate pain ('5mg'$  for moderate pain, '10mg'$  for severe pain).   pantoprazole 40 MG tablet Commonly known as: PROTONIX TAKE ONE TABLET BY MOUTH DAILY   potassium chloride 10 MEQ tablet Commonly known as: KLOR-CON M Take 2 tablets (20 mEq total) by mouth daily.   ramelteon 8 MG tablet Commonly known as: Rozerem 1 tablet p.o. at [facility]   spironolactone 25 MG tablet Commonly known as: ALDACTONE Take 1 tablet (25 mg total) by mouth daily. Start taking on: June 22, 2022   traZODone 100 MG tablet Commonly known as: DESYREL Take 1 tablet (100 mg total) by mouth at bedtime.   venlafaxine XR 75 MG 24 hr capsule Commonly known as: EFFEXOR-XR Take 3 capsules (225 mg  total) by mouth daily.               Discharge Care Instructions  (From admission, onward)           Start     Ordered   06/21/22 0000  Discharge wound care:       Comments: routine   06/21/22 1239           No Known Allergies  Follow-up Information     [facility]. Go in 20 day(s).   Specialty: Cardiology Why: Hospital follow up PLEASE bring a current medication list to appointment FREE valet parking, Entrance C, off [employer] information: [address] [phone] [place] [phone]                 The results of significant diagnostics from this hospitalization (including imaging, microbiology, ancillary and laboratory) are listed below for reference.    Significant Diagnostic Studies: DG Chest Port 1 View  Result Date: 06/17/2022 CLINICAL DATA:  Shortness of breath  EXAM: PORTABLE CHEST 1 VIEW COMPARISON:  Chest radiograph done on 04/23/2007, CT done on 06/03/2022 FINDINGS: Transverse diameter of heart is increased. There is prominence of interstitial markings in the parahilar regions and lower lung fields. There is no focal consolidation. Lateral CP angles are clear. There is no pneumothorax. IMPRESSION: Cardiomegaly. There is prominence of interstitial markings in the parahilar regions and lower lung fields suggesting possible mild interstitial edema or interstitial pneumonia. Electronically Signed   By: Elmer Picker M.D.   On: 06/17/2022 18:06   CT Abdomen Pelvis W Contrast  Result Date: 06/17/2022 CLINICAL DATA:  Nausea and vomiting. 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 EXAM: CT ABDOMEN AND PELVIS WITH CONTRAST TECHNIQUE: Multidetector CT imaging of the abdomen and pelvis was performed using the standard protocol following bolus administration of intravenous contrast. RADIATION DOSE REDUCTION: This exam was performed according  to the departmental dose-optimization program which includes automated exposure control, adjustment of the mA and/or kV according to patient size and/or use of iterative reconstruction technique. CONTRAST:  20m OMNIPAQUE IOHEXOL 300 MG/ML  SOLN COMPARISON:  CT 05/30/2022 FINDINGS: Lower chest: Small bilateral pleural effusions greater on the RIGHT. Mild basilar atelectasis. Hepatobiliary: No focal hepatic lesion. No biliary duct dilatation. Common bile duct is normal. Pancreas: Pancreas is normal. No ductal dilatation. No pancreatic inflammation. Spleen: Normal spleen Adrenals/urinary tract: Adrenal glands and kidneys are normal. The ureters and bladder normal. Stomach/Bowel: Stomach, duodenum small-bowel normal. Post RIGHT hemicolectomy. Enteric colonic anastomosis in the RIGHT upper quadrant. There is mild inflammatory stranding within the RIGHT upper quadrant mesentery related to recent surgery. No organized fluid collections. The transverse and descending colon are collapsed. No acute findings. Rectosigmoid colon normal. Vascular/Lymphatic: Abdominal aorta is normal caliber with atherosclerotic calcification. There is no retroperitoneal or periportal lymphadenopathy. No pelvic lymphadenopathy. Reproductive: Uterus and adnexa unremarkable. Other: Midline incision noted. Skin staples at the skin surface. There is soft tissue thickening along the incision. No organized fluid collections (image 49/3) Musculoskeletal: No acute osseous abnormality. IMPRESSION: 1. Post RIGHT hemicolectomy. Expected postsurgical change in the RIGHT upper quadrant. No organized fluid collections. No bowel obstruction or inflammation. 2. Midline ventral wound without complicating features. 3. Bilateral small pleural effusions greater on the RIGHT. Electronically Signed   By: SSuzy BouchardM.D.   On: 06/17/2022 16:28   CT HEAD WO CONTRAST (5MM)  Result Date: 06/10/2022 CLINICAL DATA:  Headache, tension type headache. EXAM: CT HEAD  WITHOUT CONTRAST TECHNIQUE: Contiguous axial images were obtained from the base of the skull through the vertex without intravenous contrast. RADIATION DOSE REDUCTION: This exam was performed according to the departmental dose-optimization program which includes automated exposure control, adjustment of the mA and/or kV according to patient size and/or use of iterative reconstruction technique. COMPARISON:  CT head without contrast 04/07/2020 FINDINGS: Brain: No acute infarct, hemorrhage, or mass lesion is present. Heterogeneous hypoattenuation in the thalami are stable. Basal ganglia are intact. Insular ribbon is normal. Scattered white matter hypoattenuation in the corona radiata demonstrates slight progression. No focal cortical abnormalities are present. The ventricles are of normal size. No significant extraaxial fluid collection is present. The brainstem and cerebellum are within normal limits. Vascular: Atherosclerotic calcifications are present in the cavernous internal carotid arteries bilaterally. No hyperdense vessel present. Skull: Calvarium is intact. No focal lytic or blastic lesions are present. No significant extracranial soft tissue lesion is present. Sinuses/Orbits: The paranasal sinuses and mastoid air cells are clear. Right lens replacement noted. Globes and orbits are otherwise  within normal limits. IMPRESSION: 1. No acute intracranial abnormality or significant interval change. 2. Heterogeneous hypoattenuation in the thalami bilaterally is stable. This represents remote ischemic insults. 3. Slight progression of white matter disease. This likely reflects the sequela of chronic microvascular ischemia. Electronically Signed   By: San Morelle M.D.   On: 06/10/2022 19:54   ECHOCARDIOGRAM COMPLETE  Result Date: 06/05/2022    ECHOCARDIOGRAM REPORT   Patient Name:   [name] Date of Exam: 06/04/2022 Medical Rec #:  [id number]    Height:       63.0 in Accession #:    [id number]   Weight:        245.1 lb Date of Birth:  May 30, 1951   BSA:          2.108 m Patient Age:    47 years     BP:           125/61 mmHg Patient Gender: F            HR:           57 bpm. Exam Location:  Inpatient Procedure: 2D Echo, Cardiac Doppler, Color Doppler and Intracardiac            Opacification Agent Indications:    Dyspnea  History:        Patient has no prior history of Echocardiogram examinations.                 Arrythmias:Atrial Fibrillation; Risk Factors:Hypertension and                 Dyslipidemia. GERD. AKI.  Sonographer:    Clayton Lefort RDCS (AE) Referring Phys: Michell Heinrich [facility]  Sonographer Comments: Patient is morbidly obese. Image acquisition challenging due to patient body habitus. IMPRESSIONS  1. Left ventricular ejection fraction, by estimation, is 60 to 65%. The left ventricle has normal function. The left ventricle has no regional wall motion abnormalities. There is mild left ventricular hypertrophy. Left ventricular diastolic parameters were normal. There is the interventricular septum is flattened in diastole ('D' shaped left ventricle), consistent with right ventricular volume overload.  2. Right ventricular systolic function is normal. The right ventricular size is moderately enlarged. There is mildly elevated pulmonary artery systolic pressure. The estimated right ventricular systolic pressure is 87.6 mmHg.  3. The mitral valve is normal in structure. Mild mitral valve regurgitation. No evidence of mitral stenosis.  4. Tricuspid valve regurgitation is moderate.  5. The aortic valve is grossly normal. There is mild calcification of the aortic valve. Aortic valve regurgitation is not visualized. No aortic stenosis is present.  6. The inferior vena cava is dilated in size with <50% respiratory variability, suggesting right atrial pressure of 15 mmHg. FINDINGS  Left Ventricle: Left ventricular ejection fraction, by estimation, is 60 to 65%. The left ventricle has normal function. The left ventricle has no  regional wall motion abnormalities. Definity contrast agent was given IV to delineate the left ventricular  endocardial borders. The left ventricular internal cavity size was normal in size. There is mild left ventricular hypertrophy. The interventricular septum is flattened in diastole ('D' shaped left ventricle), consistent with right ventricular volume overload. Left ventricular diastolic parameters were normal. Right Ventricle: The right ventricular size is moderately enlarged. No increase in right ventricular wall thickness. Right ventricular systolic function is normal. There is mildly elevated pulmonary artery systolic pressure. The tricuspid regurgitant velocity is 2.72 m/s, and with an assumed right atrial pressure of 15 mmHg, the  estimated right ventricular systolic pressure is 24.2 mmHg. Left Atrium: Left atrial size was normal in size. Right Atrium: Right atrial size was normal in size. Pericardium: Trivial pericardial effusion is present. Mitral Valve: The mitral valve is normal in structure. Mild mitral valve regurgitation. No evidence of mitral valve stenosis. Tricuspid Valve: The tricuspid valve is normal in structure. Tricuspid valve regurgitation is moderate . No evidence of tricuspid stenosis. Aortic Valve: The aortic valve is grossly normal. There is mild calcification of the aortic valve. Aortic valve regurgitation is not visualized. No aortic stenosis is present. Aortic valve mean gradient measures 4.0 mmHg. Aortic valve peak gradient measures 8.8 mmHg. Aortic valve area, by VTI measures 2.10 cm. Pulmonic Valve: The pulmonic valve was normal in structure. Pulmonic valve regurgitation is trivial. No evidence of pulmonic stenosis. Aorta: The aortic root is normal in size and structure. Venous: The inferior vena cava is dilated in size with less than 50% respiratory variability, suggesting right atrial pressure of 15 mmHg. IAS/Shunts: The interatrial septum was not well visualized.  LEFT  VENTRICLE PLAX 2D LVIDd:         4.10 cm   Diastology LVIDs:         2.50 cm   LV e' medial:    8.16 cm/s LV PW:         1.20 cm   LV E/e' medial:  14.7 LV IVS:        1.10 cm   LV e' lateral:   8.27 cm/s LVOT diam:     2.00 cm   LV E/e' lateral: 14.5 LV SV:         69 LV SV Index:   33 LVOT Area:     3.14 cm  RIGHT VENTRICLE             IVC RV Basal diam:  3.40 cm     IVC diam: 2.20 cm RV S prime:     11.60 cm/s TAPSE (M-mode): 2.4 cm LEFT ATRIUM             Index        RIGHT ATRIUM           Index LA diam:        3.70 cm 1.75 cm/m   RA Area:     17.80 cm LA Vol (A2C):   48.7 ml 23.10 ml/m  RA Volume:   50.90 ml  24.14 ml/m LA Vol (A4C):   72.7 ml 34.48 ml/m LA Biplane Vol: 66.0 ml 31.30 ml/m  AORTIC VALVE AV Area (Vmax):    2.08 cm AV Area (Vmean):   2.05 cm AV Area (VTI):     2.10 cm AV Vmax:           148.00 cm/s AV Vmean:          94.400 cm/s AV VTI:            0.327 m AV Peak Grad:      8.8 mmHg AV Mean Grad:      4.0 mmHg LVOT Vmax:         98.00 cm/s LVOT Vmean:        61.600 cm/s LVOT VTI:          0.219 m LVOT/AV VTI ratio: 0.67  AORTA Ao Root diam: 3.20 cm Ao Asc diam:  3.50 cm MITRAL VALVE                TRICUSPID VALVE MV Area (PHT): 2.62 cm  TR Peak grad:   29.6 mmHg MV Decel Time: 290 msec     TR Vmax:        272.00 cm/s MV E velocity: 120.00 cm/s MV A velocity: 40.00 cm/s   SHUNTS MV E/A ratio:  3.00         Systemic VTI:  0.22 m                             Systemic Diam: 2.00 cm Cherlynn Kaiser MD Electronically signed by Cherlynn Kaiser MD Signature Date/Time: 06/05/2022/6:42:10 AM    Final    CT CHEST W CONTRAST  Result Date: 06/03/2022 CLINICAL DATA:  CT chest for staging colon cancer EXAM: CT CHEST WITH CONTRAST TECHNIQUE: Multidetector CT imaging of the chest was performed during intravenous contrast administration. RADIATION DOSE REDUCTION: This exam was performed according to the departmental dose-optimization program which includes automated exposure control, adjustment of  the mA and/or kV according to patient size and/or use of iterative reconstruction technique. CONTRAST:  138m OMNIPAQUE IOHEXOL 300 MG/ML  SOLN COMPARISON:  None Available. FINDINGS: Cardiovascular: Cardiomegaly. No pericardial effusion. Coronary artery and aortic atherosclerotic calcification. Mediastinum/Nodes: Subcentimeter precarinal nodes are favored reactive. Normal esophagus. Central airways patent. Lungs/Pleura: Small bilateral pleural effusions and associated atelectasis. Mild diffuse bronchial wall thickening. Patchy air trapping in the upper lungs. Upper Abdomen: Hepatic steatosis. No acute abnormality in the upper abdomen. Musculoskeletal: No chest wall abnormality. No acute or significant osseous findings. IMPRESSION: 1. Small bilateral pleural effusions and associated atelectasis. 2. No evidence of metastatic disease in the chest. Electronically Signed   By: TPlacido SouM.D.   On: 06/03/2022 21:53   CT ABDOMEN PELVIS W CONTRAST  Result Date: 05/30/2022 CLINICAL DATA:  Diverticulitis. Complication suspected. Patient treated for presumed infectious colitis and pneumatosis earlier this month. Ongoing abdominal pain. Latest CT angio 05/26/2022 demonstrated no bleeding but there were diverticula with bowel wall thickening and pericolonic fat stranding at the ascending colon/hepatic flexure suspicious for developing or resolving diverticulitis. EXAM: CT ABDOMEN AND PELVIS WITH CONTRAST TECHNIQUE: Multidetector CT imaging of the abdomen and pelvis was performed using the standard protocol following bolus administration of intravenous contrast. RADIATION DOSE REDUCTION: This exam was performed according to the departmental dose-optimization program which includes automated exposure control, adjustment of the mA and/or kV according to patient size and/or use of iterative reconstruction technique. CONTRAST:  1018mOMNIPAQUE IOHEXOL 300 MG/ML  SOLN COMPARISON:  KUB 05/29/2022; CTA abdomen and pelvis  FINDINGS: Lower chest: There are partially visualized mild-to-moderate right-greater-than-left pleural effusions that are similar to prior. Curvilinear bilateral lower lung subsegmental atelectasis versus scarring. Heart size is again moderately enlarged. Hepatobiliary: Smooth liver contours. Diffuse decreased density is again seen throughout the liver suggesting fatty infiltration. No focal liver mass is identified. The gallbladder is unremarkable. No intrahepatic or extrahepatic biliary ductal dilatation. Pancreas: No mass or inflammatory fat stranding. No pancreatic ductal dilatation is seen. Spleen: Normal in size without focal abnormality. Adrenals/Urinary Tract: Adrenal glands are unremarkable. The kidneys enhance uniformly and are symmetric in size without hydronephrosis. No renal stone is seen. No renal mass is seen. No focal urinary bladder wall thickening. Stomach/Bowel: Mild to moderate sigmoid and mild descending colon diverticulosis without inflammatory changes. There is again mild inflammatory wall thickening of the ascending colon diffusely. This appears minimally worsened compared to recent 05/26/2022 CT. No perforation is seen. The terminal ileum is unremarkable. Normal appendix. No dilated loops of bowel to indicate  bowel obstruction. Vascular/Lymphatic: No abdominal aortic aneurysm. Moderate atherosclerotic calcifications. No mesenteric, retroperitoneal, or pelvic lymphadenopathy. Reproductive: The uterus is present.  No gross adnexal abnormality. Other: Small fat containing umbilical hernia. Moderate edema throughout the deep aspect of the abdominal and proximal thigh subcutaneous fat diffusely, similar to prior. There are again hernia mesh coils within the anterior inferior pelvis.No abdominopelvic ascites. No pneumoperitoneum. Musculoskeletal: No acute or significant osseous findings. IMPRESSION: Compared to 05/26/2022: 1. Mild-to-moderate right-greater-than-left pleural effusions. 2. Unchanged  to minimally worsened inflammatory wall thickening of the ascending colon diffusely. This is again suggestive of colitis. 3. Mild-to-moderate sigmoid and mild descending colon diverticulosis without inflammatory changes to indicate acute diverticulitis. Electronically Signed   By: Yvonne Kendall M.D.   On: 05/30/2022 17:27   DG Abd 2 Views  Result Date: 05/29/2022 CLINICAL DATA:  Abdominal distention and abdominal pain. EXAM: ABDOMEN - 2 VIEW COMPARISON:  Abdominal radiograph 05/28/2022. FINDINGS: The bowel gas pattern is normal. There is no evidence of free air given the limits of the supine images. No radio-opaque calculi or other significant radiographic abnormality is seen in the abdomen. Surgical tacks noted in the lower pelvis bilaterally. Bilateral small pleural effusions with associated atelectasis. IMPRESSION: Nonobstructive bowel gas pattern. Persistent small bilateral pleural effusions with bibasilar subsegmental atelectasis. Electronically Signed   By: Ileana Roup M.D.   On: 05/29/2022 17:52   DG Abd 2 Views  Result Date: 05/28/2022 CLINICAL DATA:  [id number] abdominal pain and blood in the stool EXAM: ABDOMEN - 2 VIEW COMPARISON:  None Available. FINDINGS: The bowel gas pattern is normal. There is no evidence of free air. No significant stool burden in the colon. No radio-opaque calculi or other significant radiographic abnormality is seen. Bilateral small pleural effusion. IMPRESSION: Bowel-gas pattern is nonobstructive.  Bilateral pleural effusion. Electronically Signed   By: Frazier Richards M.D.   On: 05/28/2022 13:44   CT ANGIO GI BLEED  Result Date: 05/26/2022 CLINICAL DATA:  GI bleed, lower Right sided abdominal pain; GI bleeding, but no active bleeding--none since Monday EXAM: CTA ABDOMEN AND PELVIS WITHOUT AND WITH CONTRAST TECHNIQUE: Multidetector CT imaging of the abdomen and pelvis was performed using the standard protocol during bolus administration of intravenous contrast. Multiplanar  reconstructed images and MIPs were obtained and reviewed to evaluate the vascular anatomy. RADIATION DOSE REDUCTION: This exam was performed according to the departmental dose-optimization program which includes automated exposure control, adjustment of the mA and/or kV according to patient size and/or use of iterative reconstruction technique. CONTRAST:  165m OMNIPAQUE IOHEXOL 350 MG/ML SOLN COMPARISON:  None Available. FINDINGS: VASCULAR No active hemorrhage noted. Aorta: Moderate to severe atherosclerotic plaque. Normal caliber aorta without aneurysm, dissection, vasculitis or significant stenosis. Celiac: Patent without evidence of aneurysm, dissection, vasculitis or significant stenosis. SMA: Patent without evidence of aneurysm, dissection, vasculitis or significant stenosis. Renals: Both renal arteries are patent without evidence of aneurysm, dissection, vasculitis, fibromuscular dysplasia or significant stenosis. IMA: Patent without evidence of aneurysm, dissection, vasculitis or significant stenosis. Inflow: Patent without evidence of aneurysm, dissection, vasculitis or significant stenosis. Proximal Outflow: Bilateral common femoral and visualized portions of the superficial and profunda femoral arteries are patent without evidence of aneurysm, dissection, vasculitis or significant stenosis. Veins: The portal vein, splenic, superior mesenteric veins are patent. Review of the MIP images confirms the above findings. NON-VASCULAR Lower chest: Passive atelectasis of bilateral lower lobes. Trace to small right and trace left pleural effusion. Tiny hiatal hernia. Hepatobiliary: No focal liver abnormality. No gallstones, gallbladder wall  thickening, or pericholecystic fluid. No biliary dilatation. Pancreas: No focal lesion. Normal pancreatic contour. No surrounding inflammatory changes. No main pancreatic ductal dilatation. Spleen: Normal in size without focal abnormality. Adrenals/Urinary Tract: No adrenal  nodule bilaterally. Bilateral kidneys enhance symmetrically. No hydronephrosis. No hydroureter. The urinary bladder is unremarkable. Stomach/Bowel: Stomach is within normal limits. No evidence of small bowel wall thickening or dilatation. Scattered colonic diverticulosis. There is a ascending colon/hepatic flexure diverticula with associated trace bowel thickening and mild surrounding pericolonic fat stranding. Appendix appears normal. Lymphatic: No lymphadenopathy. Reproductive: Uterus and bilateral adnexa are unremarkable. Other: No intraperitoneal free fluid. No intraperitoneal free gas. No organized fluid collection. Musculoskeletal: Diffuse subcutaneus soft tissue edema. No hernia. Likely bilateral inguinal hernia repairs given tacks noted. No suspicious lytic or blastic osseous lesions. No acute displaced fracture. Multilevel degenerative changes of the spine. IMPRESSION: VASCULAR 1. No active hemorrhage. 2.  Aortic Atherosclerosis (ICD10-I70.0). NON-VASCULAR 1. Colonic diverticulosis with developing or resolving uncomplicated mild acute diverticulitis of the ascending colon/hepatic flexure. Recommend colonoscopy status post treatment and status post complete resolution of inflammatory changes to exclude an underlying lesion. 2. Bilateral trace to small volume, right greater than left, pleural effusions. 3. Tiny hiatal hernia. Electronically Signed   By: Iven Finn M.D.   On: 05/26/2022 17:11    Microbiology: Recent Results (from the past 240 hour(s))  SARS Coronavirus 2 by RT PCR (hospital order, performed in [facility] hospital lab) *cepheid single result test* Anterior Nasal Swab     Status: None   Collection Time: 06/17/22  9:39 PM   Specimen: Anterior Nasal Swab  Result Value Ref Range Status   SARS Coronavirus 2 by RT PCR NEGATIVE NEGATIVE Final    Comment: (NOTE) SARS-CoV-2 target nucleic acids are NOT DETECTED.  The SARS-CoV-2 RNA is generally detectable in upper and  lower respiratory specimens during the acute phase of infection. The lowest concentration of SARS-CoV-2 viral copies this assay can detect is 250 copies / mL. A negative result does not preclude SARS-CoV-2 infection and should not be used as the sole basis for treatment or other patient management decisions.  A negative result may occur with improper specimen collection / handling, submission of specimen other than nasopharyngeal swab, presence of viral mutation(s) within the areas targeted by this assay, and inadequate number of viral copies (<250 copies / mL). A negative result must be combined with clinical observations, patient history, and epidemiological information.  Fact Sheet for Patients:   https://www.patel.info/  Fact Sheet for Healthcare Providers: https://hall.com/  This test is not yet approved or  cleared by the [place] FDA and has been authorized for detection and/or diagnosis of SARS-CoV-2 by FDA under an Emergency Use Authorization (EUA).  This EUA will remain in effect (meaning this test can be used) for the duration of the COVID-19 declaration under Section 564(b)(1) of the Act, 21 U.S.C. section 360bbb-3(b)(1), unless the authorization is terminated or revoked sooner.  Performed at [facility], [place] [address], [place], [place] [address]      Labs: Basic Metabolic Panel: Recent Labs  Lab 06/17/22 1444 06/17/22 1754 06/18/22 0326 06/19/22 0453 06/20/22 0713 06/21/22 1023  NA 140 138 140 140 138 139  K 4.2 5.1 3.6 3.1* 3.3* 4.0  CL 106  --  102 93* 93* 99  CO2 25  --  27 35* 33* 25  GLUCOSE 155*  --  111* 105* 119* 149*  BUN 12  --  7* '8 13 15  '$ CREATININE  0.58  --  0.65 0.80 0.84 0.91  CALCIUM 9.2  --  9.2 9.2 9.4 9.9   Liver Function Tests: Recent Labs  Lab 06/17/22 1444 06/18/22 0326  AST 45* 40  ALT 23 21  ALKPHOS 87 79  BILITOT 0.5 0.8  PROT 6.2* 6.0*  ALBUMIN 3.2* 3.0*   No  results for input(s): "LIPASE", "AMYLASE" in the last 168 hours. No results for input(s): "AMMONIA" in the last 168 hours. CBC: Recent Labs  Lab 06/17/22 1444 06/17/22 1754 06/18/22 0326 06/19/22 0453  WBC 5.3  --  5.6 4.5  NEUTROABS 4.4  --   --   --   HGB 10.2* 10.9* 9.5* 10.2*  HCT 33.5* 32.0* 30.3* 32.0*  MCV 97.1  --  94.4 95.2  PLT 251  --  257 279   Cardiac Enzymes: No results for input(s): "CKTOTAL", "CKMB", "CKMBINDEX", "TROPONINI" in the last 168 hours. BNP: BNP (last 3 results) Recent Labs    06/17/22 1756  BNP 412.0*    ProBNP (last 3 results) No results for input(s): "PROBNP" in the last 8760 hours.  CBG: No results for input(s): "GLUCAP" in the last 168 hours.     Signed:  Domenic Polite MD.  [facility] 06/21/2022, 12:40 PM

## 2022-06-22 ENCOUNTER — Telehealth: Payer: Self-pay | Admitting: *Deleted

## 2022-06-22 ENCOUNTER — Encounter: Payer: Self-pay | Admitting: *Deleted

## 2022-06-22 ENCOUNTER — Inpatient Hospital Stay: Payer: Medicare Other | Admitting: Sports Medicine

## 2022-06-22 NOTE — Patient Outreach (Signed)
  Care Coordination TOC Note Transition Care Management Follow-up Telephone Call Date of discharge and from where: 06/21/22  How have you been since you were released from the hospital? "I am doing so much better after this hospital release yesterday: I was a mess when you called me the first time, but this time, I am much better and doing fine; they want me to see my PCP in one week for lab work, and I have an appointment today, but I am probably going to re-schedule it for early next week, because I just had lab work yesterday in the hospital" Any questions or concerns? No  Items Reviewed: Did the pt receive and understand the discharge instructions provided? Yes  Medications obtained and verified? Yes  Other? No  Any new allergies since your discharge? No  Dietary orders reviewed? Yes Do you have support at home? Yes  resides with son; daughter assisting also  [place] and Equipment/Supplies: Were home health services ordered? yes If so, what is the name of the agency? [place]- resuming previously established services from previous hospitalizations  Has the agency set up a time to come to the patient's home? No- patient confirms has home health phone number and will call if she does not hear from them later today Were any new equipment or medical supplies ordered?  No What is the name of the medical supply agency? N/A Were you able to get the supplies/equipment? not applicable Do you have any questions related to the use of the equipment or supplies? No N/A  Functional Questionnaire: (I = Independent and D = Dependent) ADLs: I son assisting as indicated  Bathing/Dressing- I son assisting as indicated  Meal Prep- I son assisting as indicated  Eating- I  Maintaining continence- I  Transferring/Ambulation- I son assisting as indicated  Managing Meds- I son assisting as indicated  Follow up appointments reviewed:  PCP Hospital f/u appt confirmed? Yes  Scheduled  to see PCP, Thekkekandam on 06/22/22 @ 1:30 Specialist Hospital f/u appt confirmed? Yes  Scheduled to see CHF clinic (new patient) on 07/11/22 @ 12:00 pm; also confirmed patient had visit with [facility] surgical provider yesterday post-hospital discharge for suture removal Are transportation arrangements needed? No  If their condition worsens, is the pt aware to call PCP or go to the Emergency Dept.? Yes Was the patient provided with contact information for the PCP's office or ED? Yes Was to pt encouraged to call back with questions or concerns? Yes  SDOH assessments and interventions completed:   Yes  Care Coordination Interventions Activated:  Yes   Care Coordination Interventions:  Referred for Care Coordination Services:  RN Care Coordinator    Encounter Outcome:  Pt. Visit Completed    Oneta Rack, RN, BSN, [place] RN [place] Management [phone]: direct office

## 2022-06-23 DIAGNOSIS — R262 Difficulty in walking, not elsewhere classified: Secondary | ICD-10-CM | POA: Diagnosis not present

## 2022-06-23 DIAGNOSIS — D62 Acute posthemorrhagic anemia: Secondary | ICD-10-CM | POA: Diagnosis not present

## 2022-06-23 DIAGNOSIS — R339 Retention of urine, unspecified: Secondary | ICD-10-CM | POA: Diagnosis not present

## 2022-06-23 DIAGNOSIS — I480 Paroxysmal atrial fibrillation: Secondary | ICD-10-CM | POA: Diagnosis not present

## 2022-06-23 DIAGNOSIS — I951 Orthostatic hypotension: Secondary | ICD-10-CM | POA: Diagnosis not present

## 2022-06-23 DIAGNOSIS — E785 Hyperlipidemia, unspecified: Secondary | ICD-10-CM | POA: Diagnosis not present

## 2022-06-23 DIAGNOSIS — K219 Gastro-esophageal reflux disease without esophagitis: Secondary | ICD-10-CM | POA: Diagnosis not present

## 2022-06-23 DIAGNOSIS — F32A Depression, unspecified: Secondary | ICD-10-CM | POA: Diagnosis not present

## 2022-06-23 DIAGNOSIS — I110 Hypertensive heart disease with heart failure: Secondary | ICD-10-CM | POA: Diagnosis not present

## 2022-06-23 DIAGNOSIS — N179 Acute kidney failure, unspecified: Secondary | ICD-10-CM | POA: Diagnosis not present

## 2022-06-23 DIAGNOSIS — E876 Hypokalemia: Secondary | ICD-10-CM | POA: Diagnosis not present

## 2022-06-23 DIAGNOSIS — Z9049 Acquired absence of other specified parts of digestive tract: Secondary | ICD-10-CM | POA: Diagnosis not present

## 2022-06-23 DIAGNOSIS — K922 Gastrointestinal hemorrhage, unspecified: Secondary | ICD-10-CM | POA: Diagnosis not present

## 2022-06-23 DIAGNOSIS — Z7901 Long term (current) use of anticoagulants: Secondary | ICD-10-CM | POA: Diagnosis not present

## 2022-06-23 DIAGNOSIS — I5033 Acute on chronic diastolic (congestive) heart failure: Secondary | ICD-10-CM | POA: Diagnosis not present

## 2022-06-23 DIAGNOSIS — R5383 Other fatigue: Secondary | ICD-10-CM | POA: Diagnosis not present

## 2022-06-23 DIAGNOSIS — K559 Vascular disorder of intestine, unspecified: Secondary | ICD-10-CM | POA: Diagnosis not present

## 2022-06-23 DIAGNOSIS — H8110 Benign paroxysmal vertigo, unspecified ear: Secondary | ICD-10-CM | POA: Diagnosis not present

## 2022-06-23 DIAGNOSIS — L400 Psoriasis vulgaris: Secondary | ICD-10-CM | POA: Diagnosis not present

## 2022-06-23 DIAGNOSIS — E039 Hypothyroidism, unspecified: Secondary | ICD-10-CM | POA: Diagnosis not present

## 2022-06-24 ENCOUNTER — Telehealth: Payer: Self-pay

## 2022-06-24 DIAGNOSIS — R262 Difficulty in walking, not elsewhere classified: Secondary | ICD-10-CM | POA: Diagnosis not present

## 2022-06-24 DIAGNOSIS — E785 Hyperlipidemia, unspecified: Secondary | ICD-10-CM | POA: Diagnosis not present

## 2022-06-24 DIAGNOSIS — E876 Hypokalemia: Secondary | ICD-10-CM | POA: Diagnosis not present

## 2022-06-24 DIAGNOSIS — H8110 Benign paroxysmal vertigo, unspecified ear: Secondary | ICD-10-CM | POA: Diagnosis not present

## 2022-06-24 DIAGNOSIS — K922 Gastrointestinal hemorrhage, unspecified: Secondary | ICD-10-CM | POA: Diagnosis not present

## 2022-06-24 DIAGNOSIS — I5033 Acute on chronic diastolic (congestive) heart failure: Secondary | ICD-10-CM | POA: Diagnosis not present

## 2022-06-24 DIAGNOSIS — R5383 Other fatigue: Secondary | ICD-10-CM | POA: Diagnosis not present

## 2022-06-24 DIAGNOSIS — R339 Retention of urine, unspecified: Secondary | ICD-10-CM | POA: Diagnosis not present

## 2022-06-24 DIAGNOSIS — K219 Gastro-esophageal reflux disease without esophagitis: Secondary | ICD-10-CM | POA: Diagnosis not present

## 2022-06-24 DIAGNOSIS — E039 Hypothyroidism, unspecified: Secondary | ICD-10-CM | POA: Diagnosis not present

## 2022-06-24 DIAGNOSIS — F32A Depression, unspecified: Secondary | ICD-10-CM | POA: Diagnosis not present

## 2022-06-24 DIAGNOSIS — L400 Psoriasis vulgaris: Secondary | ICD-10-CM | POA: Diagnosis not present

## 2022-06-24 DIAGNOSIS — N179 Acute kidney failure, unspecified: Secondary | ICD-10-CM | POA: Diagnosis not present

## 2022-06-24 DIAGNOSIS — K559 Vascular disorder of intestine, unspecified: Secondary | ICD-10-CM | POA: Diagnosis not present

## 2022-06-24 DIAGNOSIS — Z7901 Long term (current) use of anticoagulants: Secondary | ICD-10-CM | POA: Diagnosis not present

## 2022-06-24 DIAGNOSIS — I951 Orthostatic hypotension: Secondary | ICD-10-CM | POA: Diagnosis not present

## 2022-06-24 DIAGNOSIS — D62 Acute posthemorrhagic anemia: Secondary | ICD-10-CM | POA: Diagnosis not present

## 2022-06-24 DIAGNOSIS — Z9049 Acquired absence of other specified parts of digestive tract: Secondary | ICD-10-CM | POA: Diagnosis not present

## 2022-06-24 DIAGNOSIS — I480 Paroxysmal atrial fibrillation: Secondary | ICD-10-CM | POA: Diagnosis not present

## 2022-06-24 DIAGNOSIS — I110 Hypertensive heart disease with heart failure: Secondary | ICD-10-CM | POA: Diagnosis not present

## 2022-06-24 NOTE — Telephone Encounter (Signed)
Spoke with nurse from [place], She will let patient know recommendations.   Nurse states that patient reports no pain and it only happened one time.

## 2022-06-24 NOTE — Telephone Encounter (Signed)
There was a small amount of bright red blood in patient's BM this morning.

## 2022-06-24 NOTE — Telephone Encounter (Signed)
This is not uncommon after colon surgery, if it continues more than a couple of days let both [place] and her colorectal surgeon knowm, is she having any pain?

## 2022-06-28 ENCOUNTER — Inpatient Hospital Stay: Payer: Medicare Other | Admitting: Sports Medicine

## 2022-06-28 ENCOUNTER — Other Ambulatory Visit: Payer: Self-pay | Admitting: Sports Medicine

## 2022-06-28 DIAGNOSIS — D649 Anemia, unspecified: Secondary | ICD-10-CM | POA: Diagnosis not present

## 2022-06-28 DIAGNOSIS — N179 Acute kidney failure, unspecified: Secondary | ICD-10-CM | POA: Diagnosis not present

## 2022-06-29 ENCOUNTER — Ambulatory Visit: Payer: Self-pay | Admitting: *Deleted

## 2022-06-29 DIAGNOSIS — E785 Hyperlipidemia, unspecified: Secondary | ICD-10-CM | POA: Diagnosis not present

## 2022-06-29 DIAGNOSIS — N179 Acute kidney failure, unspecified: Secondary | ICD-10-CM | POA: Diagnosis not present

## 2022-06-29 DIAGNOSIS — Z9049 Acquired absence of other specified parts of digestive tract: Secondary | ICD-10-CM | POA: Diagnosis not present

## 2022-06-29 DIAGNOSIS — R262 Difficulty in walking, not elsewhere classified: Secondary | ICD-10-CM | POA: Diagnosis not present

## 2022-06-29 DIAGNOSIS — E039 Hypothyroidism, unspecified: Secondary | ICD-10-CM | POA: Diagnosis not present

## 2022-06-29 DIAGNOSIS — I5033 Acute on chronic diastolic (congestive) heart failure: Secondary | ICD-10-CM | POA: Diagnosis not present

## 2022-06-29 DIAGNOSIS — K219 Gastro-esophageal reflux disease without esophagitis: Secondary | ICD-10-CM | POA: Diagnosis not present

## 2022-06-29 DIAGNOSIS — K559 Vascular disorder of intestine, unspecified: Secondary | ICD-10-CM | POA: Diagnosis not present

## 2022-06-29 DIAGNOSIS — I110 Hypertensive heart disease with heart failure: Secondary | ICD-10-CM | POA: Diagnosis not present

## 2022-06-29 DIAGNOSIS — R339 Retention of urine, unspecified: Secondary | ICD-10-CM | POA: Diagnosis not present

## 2022-06-29 DIAGNOSIS — K922 Gastrointestinal hemorrhage, unspecified: Secondary | ICD-10-CM | POA: Diagnosis not present

## 2022-06-29 DIAGNOSIS — E876 Hypokalemia: Secondary | ICD-10-CM | POA: Diagnosis not present

## 2022-06-29 DIAGNOSIS — I480 Paroxysmal atrial fibrillation: Secondary | ICD-10-CM | POA: Diagnosis not present

## 2022-06-29 DIAGNOSIS — I951 Orthostatic hypotension: Secondary | ICD-10-CM | POA: Diagnosis not present

## 2022-06-29 DIAGNOSIS — Z7901 Long term (current) use of anticoagulants: Secondary | ICD-10-CM | POA: Diagnosis not present

## 2022-06-29 DIAGNOSIS — R5383 Other fatigue: Secondary | ICD-10-CM | POA: Diagnosis not present

## 2022-06-29 DIAGNOSIS — H8110 Benign paroxysmal vertigo, unspecified ear: Secondary | ICD-10-CM | POA: Diagnosis not present

## 2022-06-29 DIAGNOSIS — L400 Psoriasis vulgaris: Secondary | ICD-10-CM | POA: Diagnosis not present

## 2022-06-29 DIAGNOSIS — D62 Acute posthemorrhagic anemia: Secondary | ICD-10-CM | POA: Diagnosis not present

## 2022-06-29 DIAGNOSIS — F32A Depression, unspecified: Secondary | ICD-10-CM | POA: Diagnosis not present

## 2022-06-29 LAB — CBC
HCT: 39.8 % (ref 35.0–45.0)
Hemoglobin: 12.7 g/dL (ref 11.7–15.5)
MCH: 29.1 pg (ref 27.0–33.0)
MCHC: 31.9 g/dL — ABNORMAL LOW (ref 32.0–36.0)
MCV: 91.1 fL (ref 80.0–100.0)
MPV: 10 fL (ref 7.5–12.5)
Platelets: 267 10*3/uL (ref 140–400)
RBC: 4.37 10*6/uL (ref 3.80–5.10)
RDW: 13.8 % (ref 11.0–15.0)
WBC: 4 10*3/uL (ref 3.8–10.8)

## 2022-06-29 LAB — COMPLETE METABOLIC PANEL WITH GFR
AG Ratio: 1.7 (calc) (ref 1.0–2.5)
ALT: 37 U/L — ABNORMAL HIGH (ref 6–29)
AST: 61 U/L — ABNORMAL HIGH (ref 10–35)
Albumin: 4.3 g/dL (ref 3.6–5.1)
Alkaline phosphatase (APISO): 102 U/L (ref 37–153)
BUN: 14 mg/dL (ref 7–25)
CO2: 23 mmol/L (ref 20–32)
Calcium: 9.8 mg/dL (ref 8.6–10.4)
Chloride: 101 mmol/L (ref 98–110)
Creat: 0.8 mg/dL (ref 0.60–1.00)
Globulin: 2.5 g/dL (calc) (ref 1.9–3.7)
Glucose, Bld: 156 mg/dL — ABNORMAL HIGH (ref 65–99)
Potassium: 4.1 mmol/L (ref 3.5–5.3)
Sodium: 138 mmol/L (ref 135–146)
Total Bilirubin: 0.6 mg/dL (ref 0.2–1.2)
Total Protein: 6.8 g/dL (ref 6.1–8.1)
eGFR: 79 mL/min/{1.73_m2} (ref >=60)

## 2022-06-29 LAB — IRON,TIBC AND FERRITIN PANEL
%SAT: 22 % (calc) (ref 16–45)
Ferritin: 44 ng/mL (ref 16–288)
Iron: 80 ug/dL (ref 45–160)
TIBC: 360 mcg/dL (calc) (ref 250–450)

## 2022-06-29 NOTE — Patient Outreach (Signed)
  Care Coordination   Multidisciplinary Case Conference  Visit Note   06/29/2022 Name: KAMARIYA BLEVENS MRN: [id number] DOB: 07-16-1951  EUN VERMEER is a 71 y.o. year old female who sees Thekkekandam, [name], MD for primary care.   Multidisciplinary Case Conference  Multidisciplinary Case Conference completed: Patient has had 3 recent hospital re-admissions within 30 days of one another; Updated multidisciplinary team on most recent successful [facility] telephone outreach, along with referral for ongoing RN CM Care Coordination services; next scheduled care coordination outreach by RN CM scheduled with patient for tomorrow, 06/30/22  SDOH assessments and interventions completed: previously completed during [place] Yes  Care Coordination Interventions Activated:  Yes  Care Coordination Interventions:  Yes, provided multidisciplinary case conference completed   Follow up plan:  multidisciplinary case conference completed    Encounter Outcome:  Pt. Visit Completed multidisciplinary case conference   Oneta Rack, RN, BSN, CCRN Alumnus RN CM Care Coordination/ Transition of [place] Management [phone]: direct office

## 2022-06-30 ENCOUNTER — Ambulatory Visit: Payer: Self-pay

## 2022-06-30 DIAGNOSIS — K219 Gastro-esophageal reflux disease without esophagitis: Secondary | ICD-10-CM | POA: Diagnosis not present

## 2022-06-30 DIAGNOSIS — L400 Psoriasis vulgaris: Secondary | ICD-10-CM | POA: Diagnosis not present

## 2022-06-30 DIAGNOSIS — Z9049 Acquired absence of other specified parts of digestive tract: Secondary | ICD-10-CM | POA: Diagnosis not present

## 2022-06-30 DIAGNOSIS — I110 Hypertensive heart disease with heart failure: Secondary | ICD-10-CM | POA: Diagnosis not present

## 2022-06-30 DIAGNOSIS — H8110 Benign paroxysmal vertigo, unspecified ear: Secondary | ICD-10-CM | POA: Diagnosis not present

## 2022-06-30 DIAGNOSIS — I480 Paroxysmal atrial fibrillation: Secondary | ICD-10-CM | POA: Diagnosis not present

## 2022-06-30 DIAGNOSIS — R339 Retention of urine, unspecified: Secondary | ICD-10-CM | POA: Diagnosis not present

## 2022-06-30 DIAGNOSIS — I951 Orthostatic hypotension: Secondary | ICD-10-CM | POA: Diagnosis not present

## 2022-06-30 DIAGNOSIS — E785 Hyperlipidemia, unspecified: Secondary | ICD-10-CM | POA: Diagnosis not present

## 2022-06-30 DIAGNOSIS — K559 Vascular disorder of intestine, unspecified: Secondary | ICD-10-CM | POA: Diagnosis not present

## 2022-06-30 DIAGNOSIS — K922 Gastrointestinal hemorrhage, unspecified: Secondary | ICD-10-CM | POA: Diagnosis not present

## 2022-06-30 DIAGNOSIS — N179 Acute kidney failure, unspecified: Secondary | ICD-10-CM | POA: Diagnosis not present

## 2022-06-30 DIAGNOSIS — E039 Hypothyroidism, unspecified: Secondary | ICD-10-CM | POA: Diagnosis not present

## 2022-06-30 DIAGNOSIS — R5383 Other fatigue: Secondary | ICD-10-CM | POA: Diagnosis not present

## 2022-06-30 DIAGNOSIS — Z7901 Long term (current) use of anticoagulants: Secondary | ICD-10-CM | POA: Diagnosis not present

## 2022-06-30 DIAGNOSIS — D62 Acute posthemorrhagic anemia: Secondary | ICD-10-CM | POA: Diagnosis not present

## 2022-06-30 DIAGNOSIS — E876 Hypokalemia: Secondary | ICD-10-CM | POA: Diagnosis not present

## 2022-06-30 DIAGNOSIS — I5033 Acute on chronic diastolic (congestive) heart failure: Secondary | ICD-10-CM | POA: Diagnosis not present

## 2022-06-30 DIAGNOSIS — F32A Depression, unspecified: Secondary | ICD-10-CM | POA: Diagnosis not present

## 2022-06-30 DIAGNOSIS — R262 Difficulty in walking, not elsewhere classified: Secondary | ICD-10-CM | POA: Diagnosis not present

## 2022-06-30 NOTE — Patient Outreach (Addendum)
  Care Coordination   06/30/2022 Name: [name] MRN: [id number] DOB: 05-21-1951   Care Coordination Outreach Attempts:  An unsuccessful telephone outreach was attempted for a scheduled appointment today.  Follow Up Plan:  Additional outreach attempts will be made to offer the patient care coordination information and services.   Encounter Outcome:  No Answer  Care Coordination Interventions Activated:  No   Care Coordination Interventions:  No, not indicated    Thea Silversmith, RN, MSN, BSN, CCM Care Coordinator [phone]

## 2022-07-01 ENCOUNTER — Other Ambulatory Visit: Payer: Self-pay

## 2022-07-01 MED ORDER — POTASSIUM CHLORIDE CRYS ER 10 MEQ PO TBCR: 20.0000 meq | EXTENDED_RELEASE_TABLET | Freq: Every day | ORAL | 0 refills | Status: DC

## 2022-07-01 NOTE — Telephone Encounter (Signed)
Patient called to request that a new Rx for potassium be sent to [facility] where she gets her Rx from. It was originally filled by the hospital pharmacy at her discharge.

## 2022-07-06 DIAGNOSIS — N179 Acute kidney failure, unspecified: Secondary | ICD-10-CM | POA: Diagnosis not present

## 2022-07-06 DIAGNOSIS — E876 Hypokalemia: Secondary | ICD-10-CM | POA: Diagnosis not present

## 2022-07-06 DIAGNOSIS — I110 Hypertensive heart disease with heart failure: Secondary | ICD-10-CM | POA: Diagnosis not present

## 2022-07-06 DIAGNOSIS — H8110 Benign paroxysmal vertigo, unspecified ear: Secondary | ICD-10-CM | POA: Diagnosis not present

## 2022-07-06 DIAGNOSIS — F32A Depression, unspecified: Secondary | ICD-10-CM | POA: Diagnosis not present

## 2022-07-06 DIAGNOSIS — E785 Hyperlipidemia, unspecified: Secondary | ICD-10-CM | POA: Diagnosis not present

## 2022-07-06 DIAGNOSIS — I951 Orthostatic hypotension: Secondary | ICD-10-CM | POA: Diagnosis not present

## 2022-07-06 DIAGNOSIS — I480 Paroxysmal atrial fibrillation: Secondary | ICD-10-CM | POA: Diagnosis not present

## 2022-07-06 DIAGNOSIS — K922 Gastrointestinal hemorrhage, unspecified: Secondary | ICD-10-CM | POA: Diagnosis not present

## 2022-07-06 DIAGNOSIS — K219 Gastro-esophageal reflux disease without esophagitis: Secondary | ICD-10-CM | POA: Diagnosis not present

## 2022-07-06 DIAGNOSIS — K559 Vascular disorder of intestine, unspecified: Secondary | ICD-10-CM | POA: Diagnosis not present

## 2022-07-06 DIAGNOSIS — Z9049 Acquired absence of other specified parts of digestive tract: Secondary | ICD-10-CM | POA: Diagnosis not present

## 2022-07-06 DIAGNOSIS — I5033 Acute on chronic diastolic (congestive) heart failure: Secondary | ICD-10-CM | POA: Diagnosis not present

## 2022-07-06 DIAGNOSIS — R5383 Other fatigue: Secondary | ICD-10-CM | POA: Diagnosis not present

## 2022-07-06 DIAGNOSIS — R262 Difficulty in walking, not elsewhere classified: Secondary | ICD-10-CM | POA: Diagnosis not present

## 2022-07-06 DIAGNOSIS — Z7901 Long term (current) use of anticoagulants: Secondary | ICD-10-CM | POA: Diagnosis not present

## 2022-07-06 DIAGNOSIS — E039 Hypothyroidism, unspecified: Secondary | ICD-10-CM | POA: Diagnosis not present

## 2022-07-06 DIAGNOSIS — D62 Acute posthemorrhagic anemia: Secondary | ICD-10-CM | POA: Diagnosis not present

## 2022-07-06 DIAGNOSIS — L400 Psoriasis vulgaris: Secondary | ICD-10-CM | POA: Diagnosis not present

## 2022-07-06 DIAGNOSIS — R339 Retention of urine, unspecified: Secondary | ICD-10-CM | POA: Diagnosis not present

## 2022-07-07 DIAGNOSIS — E039 Hypothyroidism, unspecified: Secondary | ICD-10-CM | POA: Diagnosis not present

## 2022-07-07 DIAGNOSIS — K559 Vascular disorder of intestine, unspecified: Secondary | ICD-10-CM | POA: Diagnosis not present

## 2022-07-07 DIAGNOSIS — D62 Acute posthemorrhagic anemia: Secondary | ICD-10-CM | POA: Diagnosis not present

## 2022-07-07 DIAGNOSIS — I480 Paroxysmal atrial fibrillation: Secondary | ICD-10-CM | POA: Diagnosis not present

## 2022-07-07 DIAGNOSIS — E876 Hypokalemia: Secondary | ICD-10-CM | POA: Diagnosis not present

## 2022-07-07 DIAGNOSIS — N179 Acute kidney failure, unspecified: Secondary | ICD-10-CM | POA: Diagnosis not present

## 2022-07-07 DIAGNOSIS — R262 Difficulty in walking, not elsewhere classified: Secondary | ICD-10-CM | POA: Diagnosis not present

## 2022-07-07 DIAGNOSIS — I5033 Acute on chronic diastolic (congestive) heart failure: Secondary | ICD-10-CM | POA: Diagnosis not present

## 2022-07-07 DIAGNOSIS — F32A Depression, unspecified: Secondary | ICD-10-CM | POA: Diagnosis not present

## 2022-07-07 DIAGNOSIS — R339 Retention of urine, unspecified: Secondary | ICD-10-CM | POA: Diagnosis not present

## 2022-07-07 DIAGNOSIS — K219 Gastro-esophageal reflux disease without esophagitis: Secondary | ICD-10-CM | POA: Diagnosis not present

## 2022-07-07 DIAGNOSIS — K922 Gastrointestinal hemorrhage, unspecified: Secondary | ICD-10-CM | POA: Diagnosis not present

## 2022-07-07 DIAGNOSIS — E785 Hyperlipidemia, unspecified: Secondary | ICD-10-CM | POA: Diagnosis not present

## 2022-07-07 DIAGNOSIS — Z7901 Long term (current) use of anticoagulants: Secondary | ICD-10-CM | POA: Diagnosis not present

## 2022-07-07 DIAGNOSIS — R5383 Other fatigue: Secondary | ICD-10-CM | POA: Diagnosis not present

## 2022-07-07 DIAGNOSIS — I110 Hypertensive heart disease with heart failure: Secondary | ICD-10-CM | POA: Diagnosis not present

## 2022-07-07 DIAGNOSIS — L400 Psoriasis vulgaris: Secondary | ICD-10-CM | POA: Diagnosis not present

## 2022-07-07 DIAGNOSIS — I951 Orthostatic hypotension: Secondary | ICD-10-CM | POA: Diagnosis not present

## 2022-07-07 DIAGNOSIS — H8110 Benign paroxysmal vertigo, unspecified ear: Secondary | ICD-10-CM | POA: Diagnosis not present

## 2022-07-07 DIAGNOSIS — Z9049 Acquired absence of other specified parts of digestive tract: Secondary | ICD-10-CM | POA: Diagnosis not present

## 2022-07-08 ENCOUNTER — Ambulatory Visit (INDEPENDENT_AMBULATORY_CARE_PROVIDER_SITE_OTHER): Payer: Medicare Other | Admitting: Sports Medicine

## 2022-07-08 ENCOUNTER — Telehealth: Payer: Self-pay | Admitting: *Deleted

## 2022-07-08 DIAGNOSIS — Z9049 Acquired absence of other specified parts of digestive tract: Secondary | ICD-10-CM | POA: Diagnosis not present

## 2022-07-08 DIAGNOSIS — F329 Major depressive disorder, single episode, unspecified: Secondary | ICD-10-CM

## 2022-07-08 DIAGNOSIS — I10 Essential (primary) hypertension: Secondary | ICD-10-CM | POA: Diagnosis not present

## 2022-07-08 DIAGNOSIS — H8110 Benign paroxysmal vertigo, unspecified ear: Secondary | ICD-10-CM | POA: Diagnosis not present

## 2022-07-08 MED ORDER — BUPROPION HCL ER (XL) 150 MG PO TB24: 150.0000 mg | ORAL_TABLET | ORAL | 11 refills | Status: DC

## 2022-07-08 NOTE — Assessment & Plan Note (Signed)
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 This was thought to be more of an ulceration, she is post left hemicolectomy, with complete resolution of symptoms.

## 2022-07-08 NOTE — Assessment & Plan Note (Signed)
Effexor to 25 daily improved her symptoms, she continues with trazodone at night but still having some depressive symptoms, we will go ahead and augment with Wellbutrin 150 with a 6-week telephone follow-up.

## 2022-07-08 NOTE — Assessment & Plan Note (Signed)
We discontinued her ARB/diuretic, blood pressures are looking good at home, 120/80. No changes for now.

## 2022-07-08 NOTE — Progress Notes (Signed)
    Procedures performed today:    None.  Independent interpretation of notes and tests performed by another provider:   None.  Brief History, Exam, Impression, and Recommendations:    Benign essential hypertension We discontinued her ARB/diuretic, blood pressures are looking good at home, 120/80. No changes for now.  Depression, major Effexor to 25 daily improved her symptoms, she continues with trazodone at night but still having some depressive symptoms, we will go ahead and augment with Wellbutrin 150 with a 6-week telephone follow-up.  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 This was thought to be more of an ulceration, she is post left hemicolectomy, with complete resolution of symptoms.   Benign positional vertigo I have recommended the Epley maneuver bilaterally for the time being.    ____________________________________________ [name]. [name], M.D., ABFM., CAQSM., AME. Primary Care and Sports Medicine [facility]  Adjunct Professor of [employer]  Risk manager

## 2022-07-08 NOTE — Assessment & Plan Note (Signed)
I have recommended the Epley maneuver bilaterally for the time being.

## 2022-07-08 NOTE — Chronic Care Management (AMB) (Signed)
  Care Coordination  Outreach Note  07/08/2022 Name: [name] MRN: [id number] DOB: 02-28-1951   Care Coordination Outreach Attempts: An unsuccessful telephone outreach was attempted today to offer the patient information about available care coordination services as a benefit of their health plan.   Rescheduling  Follow Up Plan:  Additional outreach attempts will be made to offer the patient care coordination information and services.   Encounter Outcome:  No Answer  Julian Hy, [facility] Direct Dial: [phone]

## 2022-07-11 ENCOUNTER — Ambulatory Visit (HOSPITAL_COMMUNITY)
Admit: 2022-07-11 | Discharge: 2022-07-11 | Disposition: A | Discharge status: Home / Self Care | Payer: Medicare Other | Attending: Cardiology | Admitting: Cardiology

## 2022-07-11 ENCOUNTER — Telehealth (HOSPITAL_COMMUNITY): Payer: Self-pay | Admitting: *Deleted

## 2022-07-11 ENCOUNTER — Encounter (HOSPITAL_COMMUNITY): Payer: Self-pay

## 2022-07-11 VITALS — BP 114/66 | HR 68 | Wt 210.4 lb

## 2022-07-11 DIAGNOSIS — R531 Weakness: Secondary | ICD-10-CM | POA: Diagnosis not present

## 2022-07-11 DIAGNOSIS — Z87440 Personal history of urinary (tract) infections: Secondary | ICD-10-CM | POA: Insufficient documentation

## 2022-07-11 DIAGNOSIS — I480 Paroxysmal atrial fibrillation: Secondary | ICD-10-CM | POA: Diagnosis not present

## 2022-07-11 DIAGNOSIS — I50810 Right heart failure, unspecified: Secondary | ICD-10-CM

## 2022-07-11 DIAGNOSIS — I081 Rheumatic disorders of both mitral and tricuspid valves: Secondary | ICD-10-CM | POA: Diagnosis not present

## 2022-07-11 DIAGNOSIS — K559 Vascular disorder of intestine, unspecified: Secondary | ICD-10-CM | POA: Insufficient documentation

## 2022-07-11 DIAGNOSIS — E039 Hypothyroidism, unspecified: Secondary | ICD-10-CM | POA: Insufficient documentation

## 2022-07-11 DIAGNOSIS — I272 Pulmonary hypertension, unspecified: Secondary | ICD-10-CM | POA: Diagnosis not present

## 2022-07-11 DIAGNOSIS — Z79899 Other long term (current) drug therapy: Secondary | ICD-10-CM | POA: Insufficient documentation

## 2022-07-11 DIAGNOSIS — Z7901 Long term (current) use of anticoagulants: Secondary | ICD-10-CM | POA: Diagnosis not present

## 2022-07-11 DIAGNOSIS — I110 Hypertensive heart disease with heart failure: Secondary | ICD-10-CM | POA: Diagnosis not present

## 2022-07-11 DIAGNOSIS — E785 Hyperlipidemia, unspecified: Secondary | ICD-10-CM | POA: Insufficient documentation

## 2022-07-11 DIAGNOSIS — I5032 Chronic diastolic (congestive) heart failure: Secondary | ICD-10-CM | POA: Insufficient documentation

## 2022-07-11 NOTE — Progress Notes (Signed)
ReDS Vest / Clip - 07/11/22 1200       ReDS Vest / Clip   Station Marker B    Ruler Value 33    ReDS Value Range Low volume    ReDS Actual Value 28

## 2022-07-11 NOTE — Patient Instructions (Signed)
  Medication Changes:  None  Lab Work None   Follow-Up With: Film/video editor at [facility], you and your health needs are our priority. We have a designated team specialized in the treatment of Heart Failure. This Care Team includes your primary Heart Failure Specialized Cardiologist (physician), Advanced Practice Providers (APPs- Physician Assistants and Nurse Practitioners), and Pharmacist who all work together to provide you with the care you need, when you need it.   You may see any of the following providers on your designated Care Team at your next follow up:  Dr. Glori Bickers Dr. Loralie Champagne Dr. Roxana Hires, NP Lyda Jester, [place] [facility] [place], [place] Forestine Na, NP Audry Riles, PharmD   Please be sure to bring in all your medications bottles to every appointment.   Need to Contact us:  If you have any questions or concerns before your next appointment please send [place] a message through [place] or call our office at [phone].    TO LEAVE A MESSAGE FOR THE NURSE SELECT OPTION 2, PLEASE LEAVE A MESSAGE INCLUDING: YOUR NAME DATE OF BIRTH CALL BACK NUMBER REASON FOR CALL**this is important as we prioritize the call backs  YOU WILL RECEIVE A CALL BACK THE SAME DAY AS LONG AS YOU CALL BEFORE 4:00 PM

## 2022-07-11 NOTE — Progress Notes (Signed)
HEART & VASCULAR TRANSITION OF CARE CONSULT NOTE     Referring Physician: Dr. Broadus John  Primary Care: Silverio Decamp, MD Primary Cardiologist: Osborne Oman    HPI: Referred to clinic by Dr. Verdell Carmine for heart failure consultation.    71 y.o. female with history of atrial fibrillation status post ablation done at [facility] in 2020, hypothyroidism, HTN and HLD. Echo in 2021, done at [facility], showed normal LVEF 60%, RV normal.   She was recently admitted 7/27-8/14/23 for acute blood loss anemia/ acute GIB w/ hgb 6.6, requiring multiple blood transfusions, discovered to have ischemic colitis and during the work-up was found to have colon mass underwent hemicolectomy, path was negative for malignancy.   Post op recovery was c/b transient bradycardia and volume overload/ acute CHF, felt likely provoked by IV fluid resuscitation she received earlier on during admission + perioperative IVFs, for which cardiology was consulted. She was diuresed w/ IV Lasix. 2D echo was obtained showing normal LVEF 60-55% and D-shaped LV c/w RV volume overload. RV was moderately enlarged w/ mildly elevated PASP, 44.6 mmgH, mod TR, only mild MR. She was discharged home on 8/14 on Lasix 40 mg daily. Eliquis resumed.   She presented back to the ED, 4 days later, on 8/18 w/ nausea, vomiting, diarrhea and shortness of breath. CT of A/P non acute. CXR showed pulmonary edema. CMP w/ normal renal and hepatic function. Was readmited by IM and diuresed w/ IV Lasix, 3.5L negative, creatinine stable. GDMT limited some initially by orthostatic hypotension. Able to tolerate spironolactone, Avaprol and Toprol XL. BP felt too soft for Entresto. No SGLT2 given h/o UTIs. Discharged home on 8/22. D/c wt 206 lb. Referred to [facility] clinic.   Presents today for post hospital f/u. Here w/ daughter. In [place]. Feels much better. Not very active post d/c but motivated to get back to usual baseline. Working w/ home health PT. Still weak but feels she is  improving.  No resting dyspnea, orthopnea/ PND. No further nausea/ diarrhea. Wt up 4 lb post hospital. ReDs 28%. BP ok 114/66. Occassionally feels dizzy if she stands too quickly.   We discussed further w/u of her RV dysfunction. She wishes to continue her cardiac care w/ her primary cardiology team at [facility] as it is much closer to where she lives. She plans to arrange f/u with them.    Cardiac Testing  2D Echo 8/23  left ventricular ejection fraction, by estimation, is 60 to 65%. The left ventricle has normal function. The left ventricle has no regional wall motion abnormalities. There is mild left ventricular hypertrophy. Left ventricular diastolic parameters were normal. There is the interventricular septum is flattened in diastole ('D' shaped left ventricle), consistent with right ventricular volume overload. 1. Right ventricular systolic function is normal. The right ventricular size is moderately enlarged. There is mildly elevated pulmonary artery systolic pressure. The estimated right ventricular systolic pressure is 16.3 mmHg. 2. The mitral valve is normal in structure. Mild mitral valve regurgitation. No evidence of mitral stenosis. 3. 4. Tricuspid valve regurgitation is moderate. The aortic valve is grossly normal. There is mild calcification of the aortic valve. Aortic valve regurgitation is not visualized. No aortic stenosis is present. 5. The inferior vena cava is dilated in size with <50% respiratory variability, suggesting right atrial pressure of 15 mmHg.   Review of Systems: [y] = yes, '[ ]'$  = no   General: Weight gain '[ ]'$ ; Weight loss '[ ]'$ ; Anorexia '[ ]'$ ; Fatigue '[ ]'$ ; Fever '[ ]'$ ;  Chills '[ ]'$ ; Weakness '[ ]'$   Cardiac: Chest pain/pressure '[ ]'$ ; Resting SOB '[ ]'$ ; Exertional SOB '[ ]'$ ; Orthopnea '[ ]'$ ; Pedal Edema '[ ]'$ ; Palpitations '[ ]'$ ; Syncope '[ ]'$ ; Presyncope '[ ]'$ ; Paroxysmal nocturnal dyspnea'[ ]'$   Pulmonary: Cough '[ ]'$ ; Wheezing'[ ]'$ ; Hemoptysis'[ ]'$ ; Sputum '[ ]'$ ; Snoring '[ ]'$   GI:  Vomiting'[ ]'$ ; Dysphagia'[ ]'$ ; Melena'[ ]'$ ; Hematochezia '[ ]'$ ; Heartburn'[ ]'$ ; Abdominal pain '[ ]'$ ; Constipation '[ ]'$ ; Diarrhea '[ ]'$ ; BRBPR '[ ]'$   GU: Hematuria'[ ]'$ ; Dysuria '[ ]'$ ; Nocturia'[ ]'$   Vascular: Pain in legs with walking '[ ]'$ ; Pain in feet with lying flat '[ ]'$ ; Non-healing sores '[ ]'$ ; Stroke '[ ]'$ ; TIA '[ ]'$ ; Slurred speech '[ ]'$ ;  Neuro: Headaches'[ ]'$ ; Vertigo'[ ]'$ ; Seizures'[ ]'$ ; Paresthesias'[ ]'$ ;Blurred vision '[ ]'$ ; Diplopia '[ ]'$ ; Vision changes '[ ]'$   Ortho/Skin: Arthritis '[ ]'$ ; Joint pain '[ ]'$ ; Muscle pain '[ ]'$ ; Joint swelling '[ ]'$ ; Back Pain '[ ]'$ ; Rash '[ ]'$   Psych: Depression'[ ]'$ ; Anxiety'[ ]'$   Heme: Bleeding problems '[ ]'$ ; Clotting disorders '[ ]'$ ; Anemia '[ ]'$   Endocrine: Diabetes '[ ]'$ ; Thyroid dysfunction'[ ]'$    Past Medical History:  Diagnosis Date   Anxiety    Atrial fibrillation (HCC)    Depression    GERD (gastroesophageal reflux disease)    Hypertension    Plaque psoriasis    Thyroid disease     Current Outpatient Medications  Medication Sig Dispense Refill   AMBULATORY NON FORMULARY MEDICATION Medication Name: Rollator 1 each 0   AMBULATORY NON FORMULARY MEDICATION Shower seat for use daily 1 each 0   apixaban (ELIQUIS) 5 MG TABS tablet Take 1 tablet (5 mg total) by mouth 2 (two) times daily. 60 tablet 0   buPROPion (WELLBUTRIN XL) 150 MG 24 hr tablet Take 1 tablet (150 mg total) by mouth every morning. 30 tablet 11   flecainide (TAMBOCOR) 50 MG tablet Take 50 mg by mouth 2 (two) times a day.     furosemide (LASIX) 40 MG tablet Take 1 tablet (40 mg total) by mouth daily. 30 tablet 0   gabapentin (NEURONTIN) 600 MG tablet Take 0.5 tablets (300 mg total) by mouth at bedtime.     icosapent Ethyl (VASCEPA) 1 g capsule Take 2 capsules (2 g total) by mouth 2 (two) times daily. 360 capsule 3   irbesartan (AVAPRO) 75 MG tablet Take 1 tablet (75 mg total) by mouth daily. 30 tablet 0   levothyroxine (SYNTHROID) 75 MCG tablet Take 1 tablet (75 mcg total) by mouth daily with breakfast. 90 tablet 3   metoprolol succinate  (TOPROL-XL) 50 MG 24 hr tablet Take 1 tablet (50 mg total) by mouth daily. 30 tablet 0   Multiple Vitamins-Minerals (MULTIVITAMIN WITH MINERALS) tablet Take 1 tablet by mouth daily.     ondansetron (ZOFRAN-ODT) 8 MG disintegrating tablet Take 1 tablet (8 mg total) by mouth every 8 (eight) hours as needed for nausea. 20 tablet 3   pantoprazole (PROTONIX) 40 MG tablet TAKE ONE TABLET BY MOUTH DAILY 90 tablet 3   potassium chloride (KLOR-CON M) 10 MEQ tablet Take 2 tablets (20 mEq total) by mouth daily. 30 tablet 0   ramelteon (ROZEREM) 8 MG tablet 1 tablet p.o. at [place] 90 tablet 3   spironolactone (ALDACTONE) 25 MG tablet Take 1 tablet (25 mg total) by mouth daily. 30 tablet 0   traZODone (DESYREL) 100 MG tablet Take 1 tablet (100 mg total) by mouth at bedtime. 90 tablet 3   venlafaxine XR (  EFFEXOR-XR) 75 MG 24 hr capsule Take 3 capsules (225 mg total) by mouth daily. 90 capsule 11   AMBULATORY NON FORMULARY MEDICATION Motorized scooter for use daily (Patient not taking: Reported on 07/11/2022) 1 each 0   No current facility-administered medications for this encounter.    No Known Allergies    Social History   Socioeconomic History   Marital status: Widowed    Spouse name: Not on file   Number of children: 2   Years of education: Not on file   Highest education level: Associate degree: occupational, Hotel manager, or vocational program  Occupational History   Occupation: retired  Tobacco Use   Smoking status: Never   Smokeless tobacco: Never  Vaping Use   Vaping Use: Never used  Substance and Sexual Activity   Alcohol use: Never   Drug use: Never   Sexual activity: Not on file  Other Topics Concern   Not on file  Social History Narrative   Not on file   Social Determinants of Health   Financial Resource Strain: Low Risk  (06/20/2022)   Overall Financial Resource Strain (CARDIA)    Difficulty of Paying Living Expenses: Not hard at all  Food Insecurity: No Food Insecurity  (06/22/2022)   Hunger Vital Sign    Worried About Running Out of Food in the Last Year: Never true    [place] in the Last Year: Never true  Transportation Needs: No Transportation Needs (06/22/2022)   PRAPARE - Hydrologist (Medical): No    Lack of Transportation (Non-Medical): No  Physical Activity: Inactive (07/07/2021)   Exercise Vital Sign    Days of Exercise per Week: 0 days    Minutes of Exercise per Session: 0 min  Stress: No Stress Concern Present (07/07/2021)   [employer]    Feeling of Stress : Only a little  Social Connections: Moderately Isolated (07/07/2021)   Social Connection and Isolation Panel [NHANES]    Frequency of Communication with Friends and Family: More than three times a week    Frequency of Social Gatherings with Friends and Family: Once a week    Attends Religious Services: More than 4 times per year    Active Member of [employer] or Organizations: No    Attends Archivist Meetings: Never    Marital Status: Widowed  Intimate Partner Violence: Not At Risk (07/07/2021)   Humiliation, Afraid, Rape, and Kick questionnaire    Fear of Current or Ex-Partner: No    Emotionally Abused: No    Physically Abused: No    Sexually Abused: No      Family History  Problem Relation Age of Onset   Hypertension Mother    Hypertension Father    Cancer Father        colon   Cancer Daughter        daughter   Cancer Son        skin   Cancer Maternal Aunt        breast   Hypertension Maternal Grandmother    Hypertension Maternal Grandfather    Heart attack Maternal Grandfather    Hypertension Paternal Grandmother    Stroke Paternal Grandfather    Cancer Maternal Aunt        breast    Vitals:   07/11/22 1208  BP: 114/66  Pulse: 68  SpO2: 94%  Weight: 95.4 kg (210 lb 6.4 oz)    PHYSICAL  EXAM: ReDs 28% General:  Well appearing, in [place]. No respiratory  difficulty HEENT: normal Neck: supple. no JVD. Carotids 2+ bilat; no bruits. No lymphadenopathy or thryomegaly appreciated. Cor: PMI nondisplaced. Regular rate & rhythm. No rubs, gallops or murmurs. Lungs: clear Abdomen: soft, nontender, nondistended. No hepatosplenomegaly. No bruits or masses. Good bowel sounds. Extremities: no cyanosis, clubbing, rash, trace b/l LE edema Neuro: alert & oriented x 3, cranial nerves grossly intact. moves all 4 extremities w/o difficulty. Affect pleasant.  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 RV was moderately enlarged w/ mildly elevated PASP, 44.6 mmgH, mod TR, only mild MR. Estimated RA pressure based on IVC assessment ~15 mmHg. Volume overload felt likely provoked by IV fluid resuscitation she received earlier on during admission + perioperative fluids>>diuresed w/ IV Lasix - Wt fairly stable post d/c, up 4 lb. ReDs 28%. NYHA Class III, confounded by deconditioning - We discussed further management and w/u of her RV dysfunction but she plans to follow up w/ her cardiology team at [facility] for further management. We discussed echo findings and recommended that she d/w [facility] cardiology repeat limited echo to reassess RV and RVSP, now that she has been diuresed. If c/w RV dysfunction and elevated RVSP, would recommend further w/u for pulmonary HTN w/ RHC + sleep study evaluation to assess for OSA (reports h/o snoring). Deferring additional w/u including CTD serology testing, PFTs and V/Q scan to cardiology. - w/ labile BP/ orthostatics, will not push meds - continue Lasix 40 mg daily  - continue irbesartan 75 mg daily (no Entresto yet) - continue spironolactone 25 mg daily  - continue Toprol XL 50 mg daily  - no SGLT2i given h/o UTIs  - discussed daily wts, fluid and sodium  restriction   2. PAF - s/p ablation at [facility]  - RRR on exam  - HR controlled w/ Toprol XL  - Eliquis for a/c   3. Recent ischemic colitis, colon mass - Underwent right hemicolectomy, path negative for malignancy - stable. Followed by outpatient GS and GI  NYHA III (confounded by deconditioning)  GDMT  Diuretic- Lasix 40 mg daily  BB- Toprol XL 50 mg daily  Ace/ARB/ARNI irbesartan 75 mg daily  MRA Spironolactone 25 mg daily  SGLT2i No (h/o UTIs)     Referred to HFSW (PCP, Medications, Transportation, ETOH Abuse, Drug Abuse, Insurance, Museum/gallery curator ):  No Refer to Pharmacy: No Refer to Home Health: Already followed  Refer to [facility]: No  Refer to General Cardiology: Yes [facility])  Follow up  advised she arrange f/u w/ [facility] in 2 wks

## 2022-07-11 NOTE — Telephone Encounter (Signed)
Call attempted to confirm HV TOC appt 12 noon on 07/11/22. HIPPA appropriate VM left with callback number.    Earnestine Leys, BSN, Clinical cytogeneticist Only

## 2022-07-13 ENCOUNTER — Other Ambulatory Visit: Payer: Self-pay | Admitting: Sports Medicine

## 2022-07-13 DIAGNOSIS — I5033 Acute on chronic diastolic (congestive) heart failure: Secondary | ICD-10-CM | POA: Diagnosis not present

## 2022-07-13 DIAGNOSIS — R339 Retention of urine, unspecified: Secondary | ICD-10-CM | POA: Diagnosis not present

## 2022-07-13 DIAGNOSIS — L400 Psoriasis vulgaris: Secondary | ICD-10-CM | POA: Diagnosis not present

## 2022-07-13 DIAGNOSIS — N179 Acute kidney failure, unspecified: Secondary | ICD-10-CM | POA: Diagnosis not present

## 2022-07-13 DIAGNOSIS — I480 Paroxysmal atrial fibrillation: Secondary | ICD-10-CM | POA: Diagnosis not present

## 2022-07-13 DIAGNOSIS — Z9049 Acquired absence of other specified parts of digestive tract: Secondary | ICD-10-CM | POA: Diagnosis not present

## 2022-07-13 DIAGNOSIS — K559 Vascular disorder of intestine, unspecified: Secondary | ICD-10-CM | POA: Diagnosis not present

## 2022-07-13 DIAGNOSIS — K219 Gastro-esophageal reflux disease without esophagitis: Secondary | ICD-10-CM | POA: Diagnosis not present

## 2022-07-13 DIAGNOSIS — D62 Acute posthemorrhagic anemia: Secondary | ICD-10-CM | POA: Diagnosis not present

## 2022-07-13 DIAGNOSIS — E785 Hyperlipidemia, unspecified: Secondary | ICD-10-CM | POA: Diagnosis not present

## 2022-07-13 DIAGNOSIS — I110 Hypertensive heart disease with heart failure: Secondary | ICD-10-CM | POA: Diagnosis not present

## 2022-07-13 DIAGNOSIS — E039 Hypothyroidism, unspecified: Secondary | ICD-10-CM | POA: Diagnosis not present

## 2022-07-13 DIAGNOSIS — R262 Difficulty in walking, not elsewhere classified: Secondary | ICD-10-CM | POA: Diagnosis not present

## 2022-07-13 DIAGNOSIS — Z7901 Long term (current) use of anticoagulants: Secondary | ICD-10-CM | POA: Diagnosis not present

## 2022-07-13 DIAGNOSIS — I951 Orthostatic hypotension: Secondary | ICD-10-CM | POA: Diagnosis not present

## 2022-07-13 DIAGNOSIS — E876 Hypokalemia: Secondary | ICD-10-CM | POA: Diagnosis not present

## 2022-07-13 DIAGNOSIS — R5383 Other fatigue: Secondary | ICD-10-CM | POA: Diagnosis not present

## 2022-07-13 DIAGNOSIS — H8110 Benign paroxysmal vertigo, unspecified ear: Secondary | ICD-10-CM | POA: Diagnosis not present

## 2022-07-13 DIAGNOSIS — K922 Gastrointestinal hemorrhage, unspecified: Secondary | ICD-10-CM | POA: Diagnosis not present

## 2022-07-13 DIAGNOSIS — F32A Depression, unspecified: Secondary | ICD-10-CM | POA: Diagnosis not present

## 2022-07-14 DIAGNOSIS — H8110 Benign paroxysmal vertigo, unspecified ear: Secondary | ICD-10-CM | POA: Diagnosis not present

## 2022-07-14 DIAGNOSIS — R262 Difficulty in walking, not elsewhere classified: Secondary | ICD-10-CM | POA: Diagnosis not present

## 2022-07-14 DIAGNOSIS — K559 Vascular disorder of intestine, unspecified: Secondary | ICD-10-CM | POA: Diagnosis not present

## 2022-07-14 DIAGNOSIS — L400 Psoriasis vulgaris: Secondary | ICD-10-CM | POA: Diagnosis not present

## 2022-07-14 DIAGNOSIS — I480 Paroxysmal atrial fibrillation: Secondary | ICD-10-CM | POA: Diagnosis not present

## 2022-07-14 DIAGNOSIS — I951 Orthostatic hypotension: Secondary | ICD-10-CM | POA: Diagnosis not present

## 2022-07-14 DIAGNOSIS — F32A Depression, unspecified: Secondary | ICD-10-CM | POA: Diagnosis not present

## 2022-07-14 DIAGNOSIS — Z9049 Acquired absence of other specified parts of digestive tract: Secondary | ICD-10-CM | POA: Diagnosis not present

## 2022-07-14 DIAGNOSIS — N179 Acute kidney failure, unspecified: Secondary | ICD-10-CM | POA: Diagnosis not present

## 2022-07-14 DIAGNOSIS — E876 Hypokalemia: Secondary | ICD-10-CM | POA: Diagnosis not present

## 2022-07-14 DIAGNOSIS — E785 Hyperlipidemia, unspecified: Secondary | ICD-10-CM | POA: Diagnosis not present

## 2022-07-14 DIAGNOSIS — R339 Retention of urine, unspecified: Secondary | ICD-10-CM | POA: Diagnosis not present

## 2022-07-14 DIAGNOSIS — E039 Hypothyroidism, unspecified: Secondary | ICD-10-CM | POA: Diagnosis not present

## 2022-07-14 DIAGNOSIS — K219 Gastro-esophageal reflux disease without esophagitis: Secondary | ICD-10-CM | POA: Diagnosis not present

## 2022-07-14 DIAGNOSIS — I5033 Acute on chronic diastolic (congestive) heart failure: Secondary | ICD-10-CM | POA: Diagnosis not present

## 2022-07-14 DIAGNOSIS — D62 Acute posthemorrhagic anemia: Secondary | ICD-10-CM | POA: Diagnosis not present

## 2022-07-14 DIAGNOSIS — I110 Hypertensive heart disease with heart failure: Secondary | ICD-10-CM | POA: Diagnosis not present

## 2022-07-14 DIAGNOSIS — K922 Gastrointestinal hemorrhage, unspecified: Secondary | ICD-10-CM | POA: Diagnosis not present

## 2022-07-14 DIAGNOSIS — R5383 Other fatigue: Secondary | ICD-10-CM | POA: Diagnosis not present

## 2022-07-14 DIAGNOSIS — Z7901 Long term (current) use of anticoagulants: Secondary | ICD-10-CM | POA: Diagnosis not present

## 2022-07-20 DIAGNOSIS — R5383 Other fatigue: Secondary | ICD-10-CM | POA: Diagnosis not present

## 2022-07-20 DIAGNOSIS — R339 Retention of urine, unspecified: Secondary | ICD-10-CM | POA: Diagnosis not present

## 2022-07-20 DIAGNOSIS — I110 Hypertensive heart disease with heart failure: Secondary | ICD-10-CM | POA: Diagnosis not present

## 2022-07-20 DIAGNOSIS — L400 Psoriasis vulgaris: Secondary | ICD-10-CM | POA: Diagnosis not present

## 2022-07-20 DIAGNOSIS — E039 Hypothyroidism, unspecified: Secondary | ICD-10-CM | POA: Diagnosis not present

## 2022-07-20 DIAGNOSIS — E785 Hyperlipidemia, unspecified: Secondary | ICD-10-CM | POA: Diagnosis not present

## 2022-07-20 DIAGNOSIS — I480 Paroxysmal atrial fibrillation: Secondary | ICD-10-CM | POA: Diagnosis not present

## 2022-07-20 DIAGNOSIS — K922 Gastrointestinal hemorrhage, unspecified: Secondary | ICD-10-CM | POA: Diagnosis not present

## 2022-07-20 DIAGNOSIS — D62 Acute posthemorrhagic anemia: Secondary | ICD-10-CM | POA: Diagnosis not present

## 2022-07-20 DIAGNOSIS — K559 Vascular disorder of intestine, unspecified: Secondary | ICD-10-CM | POA: Diagnosis not present

## 2022-07-20 DIAGNOSIS — K219 Gastro-esophageal reflux disease without esophagitis: Secondary | ICD-10-CM | POA: Diagnosis not present

## 2022-07-20 DIAGNOSIS — E876 Hypokalemia: Secondary | ICD-10-CM | POA: Diagnosis not present

## 2022-07-20 DIAGNOSIS — Z7901 Long term (current) use of anticoagulants: Secondary | ICD-10-CM | POA: Diagnosis not present

## 2022-07-20 DIAGNOSIS — I5033 Acute on chronic diastolic (congestive) heart failure: Secondary | ICD-10-CM | POA: Diagnosis not present

## 2022-07-20 DIAGNOSIS — F32A Depression, unspecified: Secondary | ICD-10-CM | POA: Diagnosis not present

## 2022-07-20 DIAGNOSIS — H8110 Benign paroxysmal vertigo, unspecified ear: Secondary | ICD-10-CM | POA: Diagnosis not present

## 2022-07-20 DIAGNOSIS — I951 Orthostatic hypotension: Secondary | ICD-10-CM | POA: Diagnosis not present

## 2022-07-20 DIAGNOSIS — Z9049 Acquired absence of other specified parts of digestive tract: Secondary | ICD-10-CM | POA: Diagnosis not present

## 2022-07-20 DIAGNOSIS — R262 Difficulty in walking, not elsewhere classified: Secondary | ICD-10-CM | POA: Diagnosis not present

## 2022-07-20 DIAGNOSIS — N179 Acute kidney failure, unspecified: Secondary | ICD-10-CM | POA: Diagnosis not present

## 2022-07-21 ENCOUNTER — Other Ambulatory Visit: Payer: Self-pay

## 2022-07-21 DIAGNOSIS — M48061 Spinal stenosis, lumbar region without neurogenic claudication: Secondary | ICD-10-CM

## 2022-07-21 DIAGNOSIS — E782 Mixed hyperlipidemia: Secondary | ICD-10-CM

## 2022-07-21 MED ORDER — ICOSAPENT ETHYL 1 G PO CAPS: 2.0000 g | ORAL_CAPSULE | Freq: Two times a day (BID) | ORAL | 3 refills | Status: DC

## 2022-07-21 MED ORDER — IRBESARTAN 75 MG PO TABS: 75.0000 mg | ORAL_TABLET | Freq: Every day | ORAL | 2 refills | Status: DC

## 2022-07-21 MED ORDER — SPIRONOLACTONE 25 MG PO TABS: 25.0000 mg | ORAL_TABLET | Freq: Every day | ORAL | 2 refills | Status: DC

## 2022-07-21 MED ORDER — FUROSEMIDE 40 MG PO TABS: 40.0000 mg | ORAL_TABLET | Freq: Every day | ORAL | 2 refills | Status: DC

## 2022-07-21 NOTE — Chronic Care Management (AMB) (Signed)
  Care Coordination   Note   07/21/2022 Name: [name] MRN: [id number] DOB: 08/10/1951  [name] is a 71 y.o. year old female who sees Thekkekandam, [name], MD for primary care. I reached out to Viona Gilmore by phone today to offer care coordination services.  Rescheduling   Ms. Rabinovich was given information about Care Coordination services today including:   The Care Coordination services include support from the care team which includes your Nurse Coordinator, Clinical Social Worker, or Pharmacist.  The Care Coordination team is here to help remove barriers to the health concerns and goals most important to you. Care Coordination services are voluntary, and the patient may decline or stop services at any time by request to their care team member.   Care Coordination Consent Status: Patient agreed to services and verbal consent obtained.   Follow up plan:  Telephone appointment with care coordination team member scheduled for:  07/26/2022  Encounter Outcome:  Pt. Scheduled  Julian Hy, [place] Direct Dial: [phone]

## 2022-07-22 ENCOUNTER — Telehealth: Payer: Self-pay

## 2022-07-22 NOTE — Telephone Encounter (Signed)
FYI - Per Audelia Acton, patient declined visit today due to her regular therapist being unavailable.

## 2022-07-25 ENCOUNTER — Ambulatory Visit: Payer: Medicare Other | Admitting: Sports Medicine

## 2022-07-25 DIAGNOSIS — I4819 Other persistent atrial fibrillation: Secondary | ICD-10-CM | POA: Diagnosis not present

## 2022-07-25 DIAGNOSIS — I10 Essential (primary) hypertension: Secondary | ICD-10-CM | POA: Diagnosis not present

## 2022-07-25 DIAGNOSIS — I5032 Chronic diastolic (congestive) heart failure: Secondary | ICD-10-CM | POA: Diagnosis not present

## 2022-07-26 ENCOUNTER — Telehealth: Payer: Self-pay

## 2022-07-26 NOTE — Patient Outreach (Signed)
  Care Coordination   07/26/2022 Name: [name] MRN: [id number] DOB: 1951/06/06   Care Coordination Outreach Attempts:  A second unsuccessful outreach was attempted today to offer the patient with information about available care coordination services as a benefit of their health plan.     Follow Up Plan:  Additional outreach attempts will be made to offer the patient care coordination information and services.   Encounter Outcome:  No Answer  Care Coordination Interventions Activated:  No   Care Coordination Interventions:  No, not indicated    Thea Silversmith, RN, MSN, BSN, [place] Coordinator [phone]

## 2022-07-28 ENCOUNTER — Telehealth: Payer: Self-pay

## 2022-07-28 NOTE — Telephone Encounter (Signed)
[name] called to report that patient missed her appt today because she is staying with her daughter and not her son. She is supposed to return to the son's home prior to next week's appt.

## 2022-08-03 ENCOUNTER — Telehealth: Payer: Self-pay

## 2022-08-03 NOTE — Telephone Encounter (Signed)
Laureen Abrahams called to state that patient is still staying with daughter and will miss this week's session as well. Patient asked that they discharge her from PT next week.

## 2022-08-08 DIAGNOSIS — I480 Paroxysmal atrial fibrillation: Secondary | ICD-10-CM | POA: Diagnosis not present

## 2022-08-08 DIAGNOSIS — K922 Gastrointestinal hemorrhage, unspecified: Secondary | ICD-10-CM | POA: Diagnosis not present

## 2022-08-08 DIAGNOSIS — Z9049 Acquired absence of other specified parts of digestive tract: Secondary | ICD-10-CM | POA: Diagnosis not present

## 2022-08-08 DIAGNOSIS — R5383 Other fatigue: Secondary | ICD-10-CM | POA: Diagnosis not present

## 2022-08-08 DIAGNOSIS — I951 Orthostatic hypotension: Secondary | ICD-10-CM | POA: Diagnosis not present

## 2022-08-08 DIAGNOSIS — L400 Psoriasis vulgaris: Secondary | ICD-10-CM | POA: Diagnosis not present

## 2022-08-08 DIAGNOSIS — D62 Acute posthemorrhagic anemia: Secondary | ICD-10-CM | POA: Diagnosis not present

## 2022-08-08 DIAGNOSIS — Z7901 Long term (current) use of anticoagulants: Secondary | ICD-10-CM | POA: Diagnosis not present

## 2022-08-08 DIAGNOSIS — N179 Acute kidney failure, unspecified: Secondary | ICD-10-CM | POA: Diagnosis not present

## 2022-08-08 DIAGNOSIS — K219 Gastro-esophageal reflux disease without esophagitis: Secondary | ICD-10-CM | POA: Diagnosis not present

## 2022-08-08 DIAGNOSIS — E876 Hypokalemia: Secondary | ICD-10-CM | POA: Diagnosis not present

## 2022-08-08 DIAGNOSIS — E039 Hypothyroidism, unspecified: Secondary | ICD-10-CM | POA: Diagnosis not present

## 2022-08-08 DIAGNOSIS — H8110 Benign paroxysmal vertigo, unspecified ear: Secondary | ICD-10-CM | POA: Diagnosis not present

## 2022-08-08 DIAGNOSIS — E785 Hyperlipidemia, unspecified: Secondary | ICD-10-CM | POA: Diagnosis not present

## 2022-08-08 DIAGNOSIS — I110 Hypertensive heart disease with heart failure: Secondary | ICD-10-CM | POA: Diagnosis not present

## 2022-08-08 DIAGNOSIS — F32A Depression, unspecified: Secondary | ICD-10-CM | POA: Diagnosis not present

## 2022-08-08 DIAGNOSIS — R262 Difficulty in walking, not elsewhere classified: Secondary | ICD-10-CM | POA: Diagnosis not present

## 2022-08-08 DIAGNOSIS — I5033 Acute on chronic diastolic (congestive) heart failure: Secondary | ICD-10-CM | POA: Diagnosis not present

## 2022-08-08 DIAGNOSIS — K559 Vascular disorder of intestine, unspecified: Secondary | ICD-10-CM | POA: Diagnosis not present

## 2022-08-08 DIAGNOSIS — R339 Retention of urine, unspecified: Secondary | ICD-10-CM | POA: Diagnosis not present

## 2022-08-10 DIAGNOSIS — K219 Gastro-esophageal reflux disease without esophagitis: Secondary | ICD-10-CM | POA: Diagnosis not present

## 2022-08-10 DIAGNOSIS — Z7901 Long term (current) use of anticoagulants: Secondary | ICD-10-CM | POA: Diagnosis not present

## 2022-08-10 DIAGNOSIS — I480 Paroxysmal atrial fibrillation: Secondary | ICD-10-CM | POA: Diagnosis not present

## 2022-08-10 DIAGNOSIS — Z9049 Acquired absence of other specified parts of digestive tract: Secondary | ICD-10-CM | POA: Diagnosis not present

## 2022-08-10 DIAGNOSIS — I5033 Acute on chronic diastolic (congestive) heart failure: Secondary | ICD-10-CM | POA: Diagnosis not present

## 2022-08-10 DIAGNOSIS — K922 Gastrointestinal hemorrhage, unspecified: Secondary | ICD-10-CM | POA: Diagnosis not present

## 2022-08-10 DIAGNOSIS — D62 Acute posthemorrhagic anemia: Secondary | ICD-10-CM | POA: Diagnosis not present

## 2022-08-10 DIAGNOSIS — N179 Acute kidney failure, unspecified: Secondary | ICD-10-CM | POA: Diagnosis not present

## 2022-08-10 DIAGNOSIS — R262 Difficulty in walking, not elsewhere classified: Secondary | ICD-10-CM | POA: Diagnosis not present

## 2022-08-10 DIAGNOSIS — K559 Vascular disorder of intestine, unspecified: Secondary | ICD-10-CM | POA: Diagnosis not present

## 2022-08-10 DIAGNOSIS — F32A Depression, unspecified: Secondary | ICD-10-CM | POA: Diagnosis not present

## 2022-08-10 DIAGNOSIS — H8110 Benign paroxysmal vertigo, unspecified ear: Secondary | ICD-10-CM | POA: Diagnosis not present

## 2022-08-10 DIAGNOSIS — E039 Hypothyroidism, unspecified: Secondary | ICD-10-CM | POA: Diagnosis not present

## 2022-08-10 DIAGNOSIS — L400 Psoriasis vulgaris: Secondary | ICD-10-CM | POA: Diagnosis not present

## 2022-08-10 DIAGNOSIS — R5383 Other fatigue: Secondary | ICD-10-CM | POA: Diagnosis not present

## 2022-08-10 DIAGNOSIS — E785 Hyperlipidemia, unspecified: Secondary | ICD-10-CM | POA: Diagnosis not present

## 2022-08-10 DIAGNOSIS — R339 Retention of urine, unspecified: Secondary | ICD-10-CM | POA: Diagnosis not present

## 2022-08-10 DIAGNOSIS — E876 Hypokalemia: Secondary | ICD-10-CM | POA: Diagnosis not present

## 2022-08-10 DIAGNOSIS — I951 Orthostatic hypotension: Secondary | ICD-10-CM | POA: Diagnosis not present

## 2022-08-10 DIAGNOSIS — I110 Hypertensive heart disease with heart failure: Secondary | ICD-10-CM | POA: Diagnosis not present

## 2022-08-19 ENCOUNTER — Telehealth (INDEPENDENT_AMBULATORY_CARE_PROVIDER_SITE_OTHER): Payer: Medicare Other | Admitting: Sports Medicine

## 2022-08-19 DIAGNOSIS — H8110 Benign paroxysmal vertigo, unspecified ear: Secondary | ICD-10-CM | POA: Diagnosis not present

## 2022-08-19 DIAGNOSIS — F329 Major depressive disorder, single episode, unspecified: Secondary | ICD-10-CM | POA: Diagnosis not present

## 2022-08-19 NOTE — Assessment & Plan Note (Signed)
Has improved to some degree with venlafaxine 225 mg daily and Wellbutrin 150 mg daily. Trazodone at night. Not interested in going up to a 300 mg on Wellbutrin, we can just watch this for now.

## 2022-08-19 NOTE — Assessment & Plan Note (Signed)
At the last visit [name] was complaining planing of a disequilibrium sensation that we diagnosed as benign positional vertigo. We did recommend the Epley maneuver, which seems to help some, she still has some vertigo where she feels to be listing to the right. She is not interested in any additional treatment modalities such as vestibular physical therapy so I advised her to simply keep assistive devices with her to help keep her from falling.

## 2022-08-19 NOTE — Progress Notes (Signed)
   Virtual Visit via WebEx/MyChart   I connected with  [name]  on 08/19/22 via WebEx/MyChart/Doximity Video and verified that I am speaking with the correct person using two identifiers.   I discussed the limitations, risks, security and privacy concerns of performing an evaluation and management service by WebEx/MyChart/Doximity Video, including the higher likelihood of inaccurate diagnosis and treatment, and the availability of in person appointments.  We also discussed the likely need of an additional face to face encounter for complete and high quality delivery of care.  I also discussed with the patient that there may be a patient responsible charge related to this service. The patient expressed understanding and wishes to proceed.  Provider location is in medical facility. Patient location is at their home, different from provider location. People involved in care of the patient during this telehealth encounter were myself, my nurse/medical assistant, and my front office/scheduling team member.  Review of Systems: No fevers, chills, night sweats, weight loss, chest pain, or shortness of breath.   Objective Findings:    General: Speaking full sentences, no audible heavy breathing.  Sounds alert and appropriately interactive.  Appears well.  Face symmetric.  Extraocular movements intact.  Pupils equal and round.  No nasal flaring or accessory muscle use visualized.  Independent interpretation of tests performed by another provider:   None.  Brief History, Exam, Impression, and Recommendations:    Benign positional vertigo At the last visit [place] was complaining planing of a disequilibrium sensation that we diagnosed as benign positional vertigo. We did recommend the Epley maneuver, which seems to help some, she still has some vertigo where she feels to be listing to the right. She is not interested in any additional treatment modalities such as vestibular physical therapy so I  advised her to simply keep assistive devices with her to help keep her from falling.   Depression, major Has improved to some degree with venlafaxine 225 mg daily and Wellbutrin 150 mg daily. Trazodone at night. Not interested in going up to a 300 mg on Wellbutrin, we can just watch this for now.   I discussed the above assessment and treatment plan with the patient. The patient was provided an opportunity to ask questions and all were answered. The patient agreed with the plan and demonstrated an understanding of the instructions.   The patient was advised to call back or seek an in-person evaluation if the symptoms worsen or if the condition fails to improve as anticipated.   I provided 30 minutes of face to face and non-face-to-face time during this encounter date, time was needed to gather information, review chart, records, communicate/coordinate with staff remotely, as well as complete documentation.   ____________________________________________ [name]. [name], M.D., ABFM., CAQSM., AME. Primary Care and Sports Medicine [facility]  Adjunct Professor of [place] [employer] [facility] of Medicine  Risk manager

## 2022-09-12 ENCOUNTER — Other Ambulatory Visit: Payer: Self-pay | Admitting: Sports Medicine

## 2022-09-12 DIAGNOSIS — F329 Major depressive disorder, single episode, unspecified: Secondary | ICD-10-CM

## 2022-09-20 ENCOUNTER — Other Ambulatory Visit: Payer: Self-pay

## 2022-09-20 DIAGNOSIS — F329 Major depressive disorder, single episode, unspecified: Secondary | ICD-10-CM

## 2022-09-20 MED ORDER — TRAZODONE HCL 100 MG PO TABS: 100.0000 mg | ORAL_TABLET | Freq: Every day | ORAL | 3 refills | Status: DC

## 2022-09-20 NOTE — Telephone Encounter (Signed)
Med refill

## 2022-10-07 ENCOUNTER — Other Ambulatory Visit: Payer: Self-pay | Admitting: Sports Medicine

## 2022-10-09 ENCOUNTER — Other Ambulatory Visit: Payer: Self-pay | Admitting: Sports Medicine

## 2022-10-16 ENCOUNTER — Other Ambulatory Visit: Payer: Self-pay | Admitting: Sports Medicine

## 2022-11-07 ENCOUNTER — Telehealth: Payer: Self-pay | Admitting: *Deleted

## 2022-11-07 NOTE — Progress Notes (Signed)
  Care Coordination Note  11/07/2022 Name: [name] MRN: [id number] DOB: 1951-01-31  [name] is a 72 y.o. year old female who is a primary care patient of Silverio Decamp, MD and is actively engaged with the care management team. I reached out to Viona Gilmore by phone today to assist with re-scheduling an initial visit with the RN Case Manager  Follow up plan: Patient declines further follow up and engagement by the care management team. Appropriate care team members and provider have been notified via electronic communication.   Julian Hy, [place] Direct Dial: [phone]

## 2022-11-21 DIAGNOSIS — C44619 Basal cell carcinoma of skin of left upper limb, including shoulder: Secondary | ICD-10-CM | POA: Diagnosis not present

## 2022-11-21 DIAGNOSIS — L400 Psoriasis vulgaris: Secondary | ICD-10-CM | POA: Diagnosis not present

## 2022-11-21 DIAGNOSIS — Z79899 Other long term (current) drug therapy: Secondary | ICD-10-CM | POA: Diagnosis not present

## 2022-11-21 DIAGNOSIS — C44519 Basal cell carcinoma of skin of other part of trunk: Secondary | ICD-10-CM | POA: Diagnosis not present

## 2022-11-21 DIAGNOSIS — D485 Neoplasm of uncertain behavior of skin: Secondary | ICD-10-CM | POA: Diagnosis not present

## 2022-11-28 ENCOUNTER — Other Ambulatory Visit: Payer: Self-pay | Admitting: Sports Medicine

## 2022-11-28 DIAGNOSIS — M48061 Spinal stenosis, lumbar region without neurogenic claudication: Secondary | ICD-10-CM

## 2022-12-12 ENCOUNTER — Encounter: Payer: Self-pay | Admitting: Sports Medicine

## 2022-12-12 ENCOUNTER — Ambulatory Visit (INDEPENDENT_AMBULATORY_CARE_PROVIDER_SITE_OTHER): Payer: 59 | Admitting: Sports Medicine

## 2022-12-12 DIAGNOSIS — L409 Psoriasis, unspecified: Secondary | ICD-10-CM | POA: Diagnosis not present

## 2022-12-12 DIAGNOSIS — E039 Hypothyroidism, unspecified: Secondary | ICD-10-CM | POA: Diagnosis not present

## 2022-12-12 DIAGNOSIS — R739 Hyperglycemia, unspecified: Secondary | ICD-10-CM

## 2022-12-12 DIAGNOSIS — Z111 Encounter for screening for respiratory tuberculosis: Secondary | ICD-10-CM

## 2022-12-12 DIAGNOSIS — I10 Essential (primary) hypertension: Secondary | ICD-10-CM

## 2022-12-12 MED ORDER — ZEPBOUND 2.5 MG/0.5ML ~~LOC~~ SOAJ: 2.5000 mg | SUBCUTANEOUS | 0 refills | Status: DC

## 2022-12-12 NOTE — Assessment & Plan Note (Signed)
Going to be started on Skyrizi with her dermatologist, needs some labs done before hand.

## 2022-12-12 NOTE — Assessment & Plan Note (Signed)
This is a very pleasant 72 year old female, she has morbid obesity, she is interested in trying GLP-1's, we could not get them approved last year but will try again as her insurance has changed. Starting with Zepbound per She will be part of a multidisciplinary weight loss program with calorie counting, and exercise prescription

## 2022-12-12 NOTE — Progress Notes (Signed)
    Procedures performed today:    None.  Independent interpretation of notes and tests performed by another provider:   None.  Brief History, Exam, Impression, and Recommendations:    Morbid obesity ([place]) This is a very pleasant 72 year old female, she has morbid obesity, she is interested in trying GLP-1's, we could not get them approved last year but will try again as her insurance has changed. Starting with Zepbound per She will be part of a multidisciplinary weight loss program with calorie counting, and exercise prescription  Psoriasis Going to be started on Skyrizi with her dermatologist, needs some labs done before hand.    ____________________________________________ [name]. [name], M.D., ABFM., CAQSM., AME. Primary Care and Sports Medicine [facility]  Adjunct Professor of [place] of [facility] of Medicine  Risk manager

## 2022-12-14 LAB — CBC
HCT: 37.6 % (ref 35.0–45.0)
Hemoglobin: 12.9 g/dL (ref 11.7–15.5)
MCH: 30.9 pg (ref 27.0–33.0)
MCHC: 34.3 g/dL (ref 32.0–36.0)
MCV: 90.2 fL (ref 80.0–100.0)
MPV: 9.6 fL (ref 7.5–12.5)
Platelets: 225 10*3/uL (ref 140–400)
RBC: 4.17 10*6/uL (ref 3.80–5.10)
RDW: 12.7 % (ref 11.0–15.0)
WBC: 6.6 10*3/uL (ref 3.8–10.8)

## 2022-12-14 LAB — COMPLETE METABOLIC PANEL WITH GFR
AG Ratio: 1.4 (calc) (ref 1.0–2.5)
ALT: 23 U/L (ref 6–29)
AST: 39 U/L — ABNORMAL HIGH (ref 10–35)
Albumin: 4 g/dL (ref 3.6–5.1)
Alkaline phosphatase (APISO): 106 U/L (ref 37–153)
BUN: 18 mg/dL (ref 7–25)
CO2: 29 mmol/L (ref 20–32)
Calcium: 9.5 mg/dL (ref 8.6–10.4)
Chloride: 103 mmol/L (ref 98–110)
Creat: 0.81 mg/dL (ref 0.60–1.00)
Globulin: 2.9 g/dL (calc) (ref 1.9–3.7)
Glucose, Bld: 106 mg/dL — ABNORMAL HIGH (ref 65–99)
Potassium: 4.3 mmol/L (ref 3.5–5.3)
Sodium: 142 mmol/L (ref 135–146)
Total Bilirubin: 0.4 mg/dL (ref 0.2–1.2)
Total Protein: 6.9 g/dL (ref 6.1–8.1)
eGFR: 77 mL/min/{1.73_m2} (ref >=60)

## 2022-12-14 LAB — TSH: TSH: 4.56 mIU/L — ABNORMAL HIGH (ref 0.40–4.50)

## 2022-12-14 LAB — QUANTIFERON-TB GOLD PLUS
Mitogen-NIL: 8.31 IU/mL
NIL: 0.03 IU/mL
QuantiFERON-TB Gold Plus: NEGATIVE
TB1-NIL: 0.04 IU/mL
TB2-NIL: 0.01 IU/mL

## 2022-12-14 LAB — HEMOGLOBIN A1C
Hgb A1c MFr Bld: 6.1 % of total Hgb — ABNORMAL HIGH (ref <5.7)
Mean Plasma Glucose: 128 mg/dL
eAG (mmol/L): 7.1 mmol/L

## 2022-12-14 LAB — LIPID PANEL
Cholesterol: 197 mg/dL (ref <200)
HDL: 53 mg/dL (ref >=50)
LDL Cholesterol (Calc): 108 mg/dL (calc) — ABNORMAL HIGH
Non-HDL Cholesterol (Calc): 144 mg/dL (calc) — ABNORMAL HIGH (ref <130)
Total CHOL/HDL Ratio: 3.7 (calc) (ref <5.0)
Triglycerides: 255 mg/dL — ABNORMAL HIGH (ref <150)

## 2022-12-16 ENCOUNTER — Other Ambulatory Visit: Payer: Self-pay | Admitting: Sports Medicine

## 2022-12-16 DIAGNOSIS — E782 Mixed hyperlipidemia: Secondary | ICD-10-CM

## 2022-12-20 DIAGNOSIS — C44619 Basal cell carcinoma of skin of left upper limb, including shoulder: Secondary | ICD-10-CM | POA: Diagnosis not present

## 2022-12-21 DIAGNOSIS — C44619 Basal cell carcinoma of skin of left upper limb, including shoulder: Secondary | ICD-10-CM | POA: Diagnosis not present

## 2022-12-21 DIAGNOSIS — L905 Scar conditions and fibrosis of skin: Secondary | ICD-10-CM | POA: Diagnosis not present

## 2022-12-26 DIAGNOSIS — I4819 Other persistent atrial fibrillation: Secondary | ICD-10-CM | POA: Diagnosis not present

## 2022-12-26 DIAGNOSIS — I10 Essential (primary) hypertension: Secondary | ICD-10-CM | POA: Diagnosis not present

## 2022-12-26 DIAGNOSIS — I361 Nonrheumatic tricuspid (valve) insufficiency: Secondary | ICD-10-CM | POA: Diagnosis not present

## 2022-12-29 ENCOUNTER — Telehealth: Payer: Self-pay | Admitting: Sports Medicine

## 2022-12-29 NOTE — Telephone Encounter (Signed)
Patient called and requested metropol 25 mg twice a day please advise

## 2023-01-01 NOTE — Telephone Encounter (Signed)
Is there a reason she wanted to change from Toprol-XL 50 daily?

## 2023-01-02 ENCOUNTER — Telehealth: Payer: Self-pay

## 2023-01-02 MED ORDER — METOPROLOL SUCCINATE ER 50 MG PO TB24: 50.0000 mg | ORAL_TABLET | Freq: Every day | ORAL | 1 refills | Status: DC

## 2023-01-02 NOTE — Telephone Encounter (Signed)
Med refill

## 2023-01-04 ENCOUNTER — Telehealth: Payer: Self-pay | Admitting: Sports Medicine

## 2023-01-04 MED ORDER — METOPROLOL SUCCINATE ER 50 MG PO TB24: 50.0000 mg | ORAL_TABLET | Freq: Every day | ORAL | 3 refills | Status: DC

## 2023-01-04 NOTE — Telephone Encounter (Signed)
Called patient left vm to call back and clarify

## 2023-01-04 NOTE — Telephone Encounter (Signed)
Spoke with patient she was on '25mg'$  of Metoprolol succinate at one time but she is ok with the '50mg'$ 

## 2023-01-04 NOTE — Telephone Encounter (Signed)
Sent!

## 2023-01-06 ENCOUNTER — Other Ambulatory Visit: Payer: Self-pay | Admitting: Sports Medicine

## 2023-01-14 ENCOUNTER — Other Ambulatory Visit: Payer: Self-pay | Admitting: Sports Medicine

## 2023-01-16 ENCOUNTER — Ambulatory Visit: Payer: 59 | Admitting: Sports Medicine

## 2023-01-18 ENCOUNTER — Telehealth: Payer: Self-pay

## 2023-01-18 NOTE — Telephone Encounter (Signed)
Patient's son dropped paperwork off to be filled out for insurance. He stated that she has a home health plan. They did physical therapy. The paperwork is in the folder, and that you would read through, and fill out the forms since you are the PCP for his mother. If not, could you please advise him where to take the paperwork to get it filled out? Paperwork  placed in basket.

## 2023-01-19 NOTE — Telephone Encounter (Signed)
I signed off on the home health visit therapy order.  They need to come back and pick up all of the records that they dropped off.

## 2023-01-26 ENCOUNTER — Other Ambulatory Visit: Payer: Self-pay | Admitting: Sports Medicine

## 2023-01-26 DIAGNOSIS — E039 Hypothyroidism, unspecified: Secondary | ICD-10-CM

## 2023-02-06 ENCOUNTER — Ambulatory Visit
Admission: EM | Admit: 2023-02-06 | Discharge: 2023-02-06 | Disposition: A | Discharge status: Home / Self Care | Payer: 59 | Attending: Family Medicine | Admitting: Family Medicine

## 2023-02-06 ENCOUNTER — Encounter: Payer: Self-pay | Admitting: Emergency Medicine

## 2023-02-06 ENCOUNTER — Ambulatory Visit (INDEPENDENT_AMBULATORY_CARE_PROVIDER_SITE_OTHER): Payer: 59

## 2023-02-06 DIAGNOSIS — H5500 Unspecified nystagmus: Secondary | ICD-10-CM | POA: Diagnosis not present

## 2023-02-06 DIAGNOSIS — H5460 Unqualified visual loss, one eye, unspecified: Secondary | ICD-10-CM | POA: Diagnosis not present

## 2023-02-06 DIAGNOSIS — Z8673 Personal history of transient ischemic attack (TIA), and cerebral infarction without residual deficits: Secondary | ICD-10-CM

## 2023-02-06 DIAGNOSIS — R519 Headache, unspecified: Secondary | ICD-10-CM

## 2023-02-06 DIAGNOSIS — H547 Unspecified visual loss: Secondary | ICD-10-CM

## 2023-02-06 NOTE — ED Triage Notes (Signed)
Pt here w/ daughter Cataract surgery on right  Pt's daughter noted Right eyeball was twitching last night  Pt has a dull H/A  Pt states she had vision loss while reading last night - words disappeared

## 2023-02-06 NOTE — Discharge Instructions (Addendum)
Make an appointment to see dr T for follow up If you have another spell of vision loss, loss of balance or strength or dexterity, or mental confusion call 911 or go directly to the ER

## 2023-02-06 NOTE — ED Provider Notes (Signed)
[facility]    CSN: [id number] Arrival date & time: 02/06/23  1505      History   Chief Complaint Chief Complaint  Patient presents with   Eye Problem    Right     HPI CHAREE VASSEUR is a 72 y.o. female.   HPI  Patient is here for vision problems.  She states that she was reading a book last night when she had a sudden loss of vision in her right eye.  She states that "the line went away ".  She close the book.  Assumed she was tired, and went to bed. Her daughter noticed today that she had jerking movements in her right eye so brought her to the doctor. Patient states "I feel fine".  She has had no fall or injury.  Has no sinus symptoms or infection.  Admits to mild headache Patient states she has had cataract surgery in this right eye Patient states that she had a TIA many years ago She has no change in mentation, no problems with balance or coordination She is on medicine hypertension, and has hypertensive heart disease, and atrial fibrillation.  She is not on a statin  Past Medical History:  Diagnosis Date   Anxiety    Atrial fibrillation    Depression    GERD (gastroesophageal reflux disease)    Hypertension    Plaque psoriasis    Thyroid disease     Patient Active Problem List   Diagnosis Date Noted   Psoriasis 12/12/2022   Status post left hemicolectomy 07/08/2022   Benign positional vertigo 07/08/2022   Acute on chronic diastolic CHF (congestive heart failure) 06/13/2022   Colitis 05/23/2022   Idiopathic insomnia 11/22/2021   Morbid obesity 11/22/2021   Polypharmacy 10/06/2020   Primary osteoarthritis of both knees 05/29/2020   Lumbar spinal stenosis 03/27/2019   Mixed hyperlipidemia 08/19/2018   Annual physical exam 08/17/2018   Chronic atrial fibrillation 08/17/2018   Benign essential hypertension 08/17/2018   Depression, major 08/17/2018   Hypothyroidism 08/17/2018   GERD (gastroesophageal reflux disease) 08/17/2018    Past Surgical  History:  Procedure Laterality Date   BIOPSY  06/02/2022   Procedure: BIOPSY;  Surgeon: Jenel Lucks, MD;  Location: [facility] ENDOSCOPY;  Service: Gastroenterology;;   COLONOSCOPY N/A 06/02/2022   Procedure: COLONOSCOPY;  Surgeon: Jenel Lucks, MD;  Location: [facility] ENDOSCOPY;  Service: Gastroenterology;  Laterality: N/A;   ESOPHAGOGASTRODUODENOSCOPY N/A 06/02/2022   Procedure: ESOPHAGOGASTRODUODENOSCOPY (EGD);  Surgeon: Jenel Lucks, MD;  Location: [facility] ENDOSCOPY;  Service: Gastroenterology;  Laterality: N/A;   LAPAROSCOPIC PARTIAL COLECTOMY N/A 06/07/2022   Procedure: LAPAROSCOPIC PARTIAL COLECTOMY; POSSIBLE OPEN PARTIAL COLECTOMY;  Surgeon: Axel Filler, MD;  Location: [facility] OR;  Service: General;  Laterality: N/A;   ORIF ANKLE FRACTURE     POLYPECTOMY  06/02/2022   Procedure: POLYPECTOMY;  Surgeon: Jenel Lucks, MD;  Location: [facility] ENDOSCOPY;  Service: Gastroenterology;;   SUBMUCOSAL TATTOO INJECTION  06/02/2022   Procedure: SUBMUCOSAL TATTOO INJECTION;  Surgeon: Jenel Lucks, MD;  Location: [facility] ENDOSCOPY;  Service: Gastroenterology;;    OB History   No obstetric history on file.      Home Medications    Prior to Admission medications   Medication Sig Start Date End Date Taking? Authorizing Provider  AMBULATORY NON FORMULARY MEDICATION Medication Name: Rollator 06/08/21   Monica Becton, MD  AMBULATORY NON FORMULARY MEDICATION Motorized scooter for use daily 11/12/21   Monica Becton, MD  AMBULATORY NON  FORMULARY MEDICATION Shower seat for use daily 05/16/22   Monica Becton, MD  apixaban (ELIQUIS) 5 MG TABS tablet Take 1 tablet (5 mg total) by mouth 2 (two) times daily. 06/13/22   Elgergawy, Leana Roe, MD  buPROPion (WELLBUTRIN XL) 150 MG 24 hr tablet Take 1 tablet (150 mg total) by mouth every morning. 07/08/22   Monica Becton, MD  flecainide (TAMBOCOR) 50 MG tablet Take 50 mg by mouth 2 (two) times a day. 03/20/19   [provider]   furosemide (LASIX) 40 MG tablet TAKE 1 TABLET BY MOUTH DAILY 01/16/23   Monica Becton, MD  gabapentin (NEURONTIN) 600 MG tablet Take 0.5 tablets (300 mg total) by mouth at bedtime. 06/21/22   Zannie Cove, MD  icosapent Ethyl (VASCEPA) 1 g capsule Take 2 capsules (2 g total) by mouth 2 (two) times daily. 07/21/22   Monica Becton, MD  irbesartan (AVAPRO) 75 MG tablet TAKE ONE TABLET BY MOUTH DAILY 01/16/23   Monica Becton, MD  levothyroxine (SYNTHROID) 75 MCG tablet TAKE ONE TABLET BY MOUTH DAILY WITH BREAKFAST 01/26/23   Monica Becton, MD  metoprolol succinate (TOPROL-XL) 50 MG 24 hr tablet Take 1 tablet (50 mg total) by mouth daily. 01/04/23   Monica Becton, MD  Multiple Vitamins-Minerals (MULTIVITAMIN WITH MINERALS) tablet Take 1 tablet by mouth daily.    [provider]  pantoprazole (PROTONIX) 40 MG tablet TAKE ONE TABLET BY MOUTH DAILY 04/28/22   Monica Becton, MD  ramelteon (ROZEREM) 8 MG tablet 1 tablet p.o. at [facility] 01/24/22   Monica Becton, MD  spironolactone (ALDACTONE) 25 MG tablet TAKE 1 TABLET BY MOUTH DAILY 01/06/23   Monica Becton, MD  traMADol (ULTRAM) 50 MG tablet TAKE ONE TABLET BY MOUTH EVERY 8 HOURS AS NEEDED FOR MODERATE PAIN 11/28/22   Monica Becton, MD  traZODone (DESYREL) 100 MG tablet Take 1 tablet (100 mg total) by mouth at bedtime. 09/20/22   Monica Becton, MD  venlafaxine XR (EFFEXOR-XR) 75 MG 24 hr capsule Take 3 capsules (225 mg total) by mouth daily. 05/23/22   Monica Becton, MD    Family History Family History  Problem Relation Age of Onset   Hypertension Mother    Hypertension Father    Cancer Father        colon   Hypertension Maternal Grandmother    Hypertension Maternal Grandfather    Heart attack Maternal Grandfather    Hypertension Paternal Grandmother    Stroke Paternal Grandfather    Cancer Daughter        daughter   Cancer Son        skin    Cancer Maternal Aunt        breast   Cancer Maternal Aunt        breast    Social History Social History   Tobacco Use   Smoking status: Never   Smokeless tobacco: Never  Vaping Use   Vaping Use: Never used  Substance Use Topics   Alcohol use: Never   Drug use: Never     Allergies   Patient has no known allergies.   Review of Systems Review of Systems See HPI  Physical Exam Triage Vital Signs ED Triage Vitals  Enc Vitals Group     BP 02/06/23 1514 (!) 140/83     Pulse Rate 02/06/23 1514 61     Resp 02/06/23 1514 16     Temp 02/06/23 1514 (!)  97.5 F (36.4 C)     Temp Source 02/06/23 1514 Oral     SpO2 02/06/23 1514 97 %     Weight 02/06/23 1520 205 lb 14.6 oz (93.4 kg)     Height 02/06/23 1520 5\' 3"  (1.6 m)     Head Circumference --      Peak Flow --      Pain Score 02/06/23 1519 2     Pain Loc --      Pain Edu? --      Excl. in GC? --    No data found.  Updated Vital Signs BP (!) 140/83 (BP Location: Left Arm)   Pulse 61   Temp (!) 97.5 F (36.4 C) (Oral)   Resp 16   Ht 5\' 3"  (1.6 m)   Wt 93.4 kg   SpO2 97%   BMI 36.48 kg/m      Physical Exam Constitutional:      General: She is not in acute distress.    Appearance: She is well-developed. She is obese.     Comments: Somewhat dull/ bland affect  HENT:     Head: Normocephalic and atraumatic.     Right Ear: Tympanic membrane and ear canal normal.     Left Ear: Tympanic membrane and ear canal normal.     Nose: Nose normal. No congestion.     Mouth/Throat:     Mouth: Mucous membranes are moist.     Pharynx: No posterior oropharyngeal erythema.  Eyes:     Extraocular Movements: Extraocular movements intact.     Conjunctiva/sclera: Conjunctivae normal.     Pupils: Pupils are equal, round, and reactive to light.     Comments: Right eye nystagmus at rest and with movement.  Right disc flat.  Left cataract  Neck:     Vascular: No carotid bruit.  Cardiovascular:     Rate and Rhythm: Normal  rate and regular rhythm.     Heart sounds: Normal heart sounds.  Pulmonary:     Effort: Pulmonary effort is normal. No respiratory distress.     Breath sounds: Normal breath sounds.  Abdominal:     General: There is no distension.     Palpations: Abdomen is soft.  Musculoskeletal:        General: Normal range of motion.     Cervical back: Normal range of motion. No rigidity.  Skin:    General: Skin is warm and dry.  Neurological:     General: No focal deficit present.     Mental Status: She is alert.      UC Treatments / Results  Labs (all labs ordered are listed, but only abnormal results are displayed) Labs Reviewed - No data to display  EKG   Radiology MR BRAIN WO CONTRAST  Result Date: 02/06/2023 CLINICAL DATA:  Vision loss, monocular Vision impairment (Ped 0-17y) EXAM: MRI HEAD WITHOUT CONTRAST TECHNIQUE: Multiplanar, multiecho pulse sequences of the brain and surrounding structures were obtained without intravenous contrast. COMPARISON:  CT head 06/10/2022. FINDINGS: Brain: No acute infarction, hemorrhage, hydrocephalus, extra-axial collection or mass lesion. Mild for age T2/FLAIR hyperintensities in the white matter, nonspecific but compatible with chronic microvascular ischemic disease. Vascular: Major arterial flow voids are maintained. Skull and upper cervical spine: Normal marrow signal. Sinuses/Orbits: Clear sinuses.  No acute orbital findings. Other: No mastoid effusions. IMPRESSION: Unremarkable brain MRI for patient age. No evidence of acute abnormality. Electronically Signed   By: Feliberto HartsFrederick S Jones M.D.   On: 02/06/2023 16:49  Procedures Procedures (including critical care time)  Medications Ordered in UC Medications - No data to display  Initial Impression / Assessment and Plan / UC Course  I have reviewed the triage vital signs and the nursing notes.  Pertinent labs & imaging results that were available during my care of the patient were reviewed by me and  considered in my medical decision making (see chart for details).     Results are discussed with patient She is advised to follow-up with her PCP tomorrow for additional testing Final Clinical Impressions(s) / UC Diagnoses   Final diagnoses:  Monocular vision loss  Nystagmus of right eye  Acute intractable headache, unspecified headache type  Remote history of TIA     Discharge Instructions      Make an appointment to see dr T for follow up If you have another spell of vision loss, loss of balance or strength or dexterity, or mental confusion call 911 or go directly to the ER     ED Prescriptions   None    PDMP not reviewed this encounter.   Eustace Moore, MD 02/06/23 [phone]

## 2023-02-10 ENCOUNTER — Ambulatory Visit: Payer: 59 | Admitting: Sports Medicine

## 2023-02-14 ENCOUNTER — Ambulatory Visit (INDEPENDENT_AMBULATORY_CARE_PROVIDER_SITE_OTHER): Payer: 59 | Admitting: Sports Medicine

## 2023-02-14 ENCOUNTER — Encounter: Payer: Self-pay | Admitting: Sports Medicine

## 2023-02-14 VITALS — BP 148/83 | HR 90

## 2023-02-14 DIAGNOSIS — G43109 Migraine with aura, not intractable, without status migrainosus: Secondary | ICD-10-CM

## 2023-02-14 NOTE — Assessment & Plan Note (Signed)
Pleasant 72 year old female, she recently had an episode of monocular vision loss, she went to sleep, woke up and the vision had returned, she also had what they described as jerking of the eye, but they told me it was not the eyelid. She had a brain MRI in urgent care, brain MRI with and without contrast was normal. She also has a headache, so I do suspect she had a ocular migraine. I did tell her that if this occurs again she still needs to present for emergency care.

## 2023-02-14 NOTE — Assessment & Plan Note (Signed)
Sounds like weight loss injections/and I will hide will be covered now, due to her heart condition, she will look up specifically which one and let me know in a MyChart message.

## 2023-02-14 NOTE — Progress Notes (Signed)
    Procedures performed today:    None.  Independent interpretation of notes and tests performed by another provider:   None.  Brief History, Exam, Impression, and Recommendations:    Ocular migraine Pleasant 72 year old female, she recently had an episode of monocular vision loss, she went to sleep, woke up and the vision had returned, she also had what they described as jerking of the eye, but they told me it was not the eyelid. She had a brain MRI in urgent care, brain MRI with and without contrast was normal. She also has a headache, so I do suspect she had a ocular migraine. I did tell her that if this occurs again she still needs to present for emergency care.  Morbid obesity (HCC) Sounds like weight loss injections/and I will hide will be covered now, due to her heart condition, she will look up specifically which one and let me know in a MyChart message.    ____________________________________________ Ihor Austin. Benjamin Stain, M.D., ABFM., CAQSM., AME. Primary Care and Sports Medicine [facility]  Adjunct Professor of Family Medicine  [employer]  Restaurant manager, fast food

## 2023-02-18 ENCOUNTER — Encounter: Payer: Self-pay | Admitting: Sports Medicine

## 2023-02-18 DIAGNOSIS — I2510 Atherosclerotic heart disease of native coronary artery without angina pectoris: Secondary | ICD-10-CM

## 2023-02-20 DIAGNOSIS — L400 Psoriasis vulgaris: Secondary | ICD-10-CM | POA: Diagnosis not present

## 2023-02-20 DIAGNOSIS — Z79899 Other long term (current) drug therapy: Secondary | ICD-10-CM | POA: Diagnosis not present

## 2023-02-20 DIAGNOSIS — Z85828 Personal history of other malignant neoplasm of skin: Secondary | ICD-10-CM | POA: Diagnosis not present

## 2023-02-20 DIAGNOSIS — C44519 Basal cell carcinoma of skin of other part of trunk: Secondary | ICD-10-CM | POA: Diagnosis not present

## 2023-02-20 DIAGNOSIS — L570 Actinic keratosis: Secondary | ICD-10-CM | POA: Diagnosis not present

## 2023-02-20 MED ORDER — WEGOVY 0.25 MG/0.5ML ~~LOC~~ SOAJ: 0.2500 mg | SUBCUTANEOUS | 0 refills | Status: DC

## 2023-02-20 NOTE — Telephone Encounter (Signed)
Reginal Lutes is prescribed for the prevention of cardiovascular disease in an obese patient

## 2023-03-07 ENCOUNTER — Telehealth: Payer: Self-pay | Admitting: General Practice

## 2023-03-07 NOTE — Telephone Encounter (Signed)
Contacted CHEYANNE CHATEL to schedule their annual wellness visit. Appointment made for 03/08/23.  Modesto Charon, RN BSN

## 2023-03-08 ENCOUNTER — Ambulatory Visit (INDEPENDENT_AMBULATORY_CARE_PROVIDER_SITE_OTHER): Payer: 59 | Admitting: Family Medicine

## 2023-03-08 DIAGNOSIS — Z0000 Encounter for general adult medical examination without abnormal findings: Secondary | ICD-10-CM

## 2023-03-08 DIAGNOSIS — Z1231 Encounter for screening mammogram for malignant neoplasm of breast: Secondary | ICD-10-CM

## 2023-03-08 DIAGNOSIS — Z780 Asymptomatic menopausal state: Secondary | ICD-10-CM | POA: Diagnosis not present

## 2023-03-08 NOTE — Patient Instructions (Addendum)
MEDICARE ANNUAL WELLNESS VISIT Health Maintenance Summary and Written Plan of Care  [name] ,  Thank you for allowing me to perform your Medicare Annual Wellness Visit and for your ongoing commitment to your health.   Health Maintenance & Immunization History Health Maintenance  Topic Date Due   DTaP/Tdap/Td (1 - Tdap) Never done   COVID-19 Vaccine (3 - Pfizer risk series) 03/24/2023 (Originally 02/24/2020)   Zoster Vaccines- Shingrix (1 of 2) 06/08/2023 (Originally 08/15/1970)   Pneumonia Vaccine 37+ Years old (1 of 1 - PCV) 03/07/2024 (Originally 08/16/2015)   MAMMOGRAM  03/07/2024 (Originally 04/18/2021)   DEXA SCAN  03/07/2024 (Originally 08/16/2015)   INFLUENZA VACCINE  06/01/2023   Medicare Annual Wellness (AWV)  03/07/2024   COLONOSCOPY (Pts 45-69yrs Insurance coverage will need to be confirmed)  06/02/2032   Hepatitis C Screening  Completed   HPV VACCINES  Aged Out   Fecal DNA (Cologuard)  Discontinued   Immunization History  Administered Date(s) Administered   PFIZER(Purple Top)SARS-COV-2 Vaccination 01/06/2020, 01/27/2020    These are the patient goals that we discussed:  Goals Addressed               This Visit's Progress     Patient Stated (pt-stated)        Patient stated that she would like to loose weight.         This is a list of Health Maintenance Items that are overdue or due now: Health Maintenance Due  Topic Date Due   DTaP/Tdap/Td (1 - Tdap) Never done   Pneumococcal vaccine  Td vaccine Shingles vaccine Mammogram  Bone density scan Patient declined the pneumonia vaccine and shingles vaccine.  Orders/Referrals Placed Today: Orders Placed This Encounter  Procedures   DEXAScan    Standing Status:   Future    Standing Expiration Date:   03/07/2024    Scheduling Instructions:     Please call patient to schedule.    Order Specific Question:   Reason for exam:    Answer:   post menopausal    Order Specific Question:   Preferred imaging  location?    Answer:   [facility]   Mammogram 3D SCREEN BREAST BILATERAL    Standing Status:   Future    Standing Expiration Date:   03/07/2024    Scheduling Instructions:     Please call patient to schedule    Order Specific Question:   Reason for Exam (SYMPTOM  OR DIAGNOSIS REQUIRED)    Answer:   post menopausal    Order Specific Question:   Preferred imaging location?    Answer:   [facility]   (Contact our referral department at [phone] if you have not spoken with someone about your referral appointment within the next 5 days)    Follow-up Plan Follow-up with Monica Becton, MD as planned Schedule tetanus vaccine at the pharmacy.  Medicare wellness visit in one year.  Patient will access AVS on mychart.      Health Maintenance, Female Adopting a healthy lifestyle and getting preventive care are important in promoting health and wellness. Ask your health care provider about: The right schedule for you to have regular tests and exams. Things you can do on your own to prevent diseases and keep yourself healthy. What should I know about diet, weight, and exercise? Eat a healthy diet  Eat a diet that includes plenty of vegetables, fruits, low-fat dairy products, and lean protein. Do not eat a lot of foods that  are high in solid fats, added sugars, or sodium. Maintain a healthy weight Body mass index (BMI) is used to identify weight problems. It estimates body fat based on height and weight. Your health care provider can help determine your BMI and help you achieve or maintain a healthy weight. Get regular exercise Get regular exercise. This is one of the most important things you can do for your health. Most adults should: Exercise for at least 150 minutes each week. The exercise should increase your heart rate and make you sweat (moderate-intensity exercise). Do strengthening exercises at least twice a week. This is in addition to the  moderate-intensity exercise. Spend less time sitting. Even light physical activity can be beneficial. Watch cholesterol and blood lipids Have your blood tested for lipids and cholesterol at 73 years of age, then have this test every 5 years. Have your cholesterol levels checked more often if: Your lipid or cholesterol levels are high. You are older than 73 years of age. You are at high risk for heart disease. What should I know about cancer screening? Depending on your health history and family history, you may need to have cancer screening at various ages. This may include screening for: Breast cancer. Cervical cancer. Colorectal cancer. Skin cancer. Lung cancer. What should I know about heart disease, diabetes, and high blood pressure? Blood pressure and heart disease High blood pressure causes heart disease and increases the risk of stroke. This is more likely to develop in people who have high blood pressure readings or are overweight. Have your blood pressure checked: Every 3-5 years if you are 71-51 years of age. Every year if you are 17 years old or older. Diabetes Have regular diabetes screenings. This checks your fasting blood sugar level. Have the screening done: Once every three years after age 40 if you are at a normal weight and have a low risk for diabetes. More often and at a younger age if you are overweight or have a high risk for diabetes. What should I know about preventing infection? Hepatitis B If you have a higher risk for hepatitis B, you should be screened for this virus. Talk with your health care provider to find out if you are at risk for hepatitis B infection. Hepatitis C Testing is recommended for: Everyone born from [birth year] through 1966. Anyone with known risk factors for hepatitis C. Sexually transmitted infections (STIs) Get screened for STIs, including gonorrhea and chlamydia, if: You are sexually active and are younger than 73 years of age. You are  older than 73 years of age and your health care provider tells you that you are at risk for this type of infection. Your sexual activity has changed since you were last screened, and you are at increased risk for chlamydia or gonorrhea. Ask your health care provider if you are at risk. Ask your health care provider about whether you are at high risk for HIV. Your health care provider may recommend a prescription medicine to help prevent HIV infection. If you choose to take medicine to prevent HIV, you should first get tested for HIV. You should then be tested every 3 months for as long as you are taking the medicine. Pregnancy If you are about to stop having your period (premenopausal) and you may become pregnant, seek counseling before you get pregnant. Take 400 to 800 micrograms (mcg) of folic acid every day if you become pregnant. Ask for birth control (contraception) if you want to prevent pregnancy. Osteoporosis and menopause  Osteoporosis is a disease in which the bones lose minerals and strength with aging. This can result in bone fractures. If you are 50 years old or older, or if you are at risk for osteoporosis and fractures, ask your health care provider if you should: Be screened for bone loss. Take a calcium or vitamin D supplement to lower your risk of fractures. Be given hormone replacement therapy (HRT) to treat symptoms of menopause. Follow these instructions at home: Alcohol use Do not drink alcohol if: Your health care provider tells you not to drink. You are pregnant, may be pregnant, or are planning to become pregnant. If you drink alcohol: Limit how much you have to: 0-1 drink a day. Know how much alcohol is in your drink. In the [place] one drink equals one 12 oz bottle of beer (355 mL), one 5 oz glass of wine (148 mL), or one 1 oz glass of hard liquor (44 mL). Lifestyle Do not use any products that contain nicotine or tobacco. These products include cigarettes, chewing  tobacco, and vaping devices, such as e-cigarettes. If you need help quitting, ask your health care provider. Do not use street drugs. Do not share needles. Ask your health care provider for help if you need support or information about quitting drugs. General instructions Schedule regular health, dental, and eye exams. Stay current with your vaccines. Tell your health care provider if: You often feel depressed. You have ever been abused or do not feel safe at home. Summary Adopting a healthy lifestyle and getting preventive care are important in promoting health and wellness. Follow your health care provider's instructions about healthy diet, exercising, and getting tested or screened for diseases. Follow your health care provider's instructions on monitoring your cholesterol and blood pressure. This information is not intended to replace advice given to you by your health care provider. Make sure you discuss any questions you have with your health care provider. Document Revised: 03/08/2021 Document Reviewed: 03/08/2021 Elsevier Patient Education  2023 [employer].

## 2023-03-08 NOTE — Progress Notes (Signed)
MEDICARE ANNUAL WELLNESS VISIT  03/08/2023  Telephone Visit Disclaimer This Medicare AWV was conducted by telephone due to national recommendations for restrictions regarding the COVID-19 Pandemic (e.g. social distancing).  I verified, using two identifiers, that I am speaking with [name] or their authorized healthcare agent. I discussed the limitations, risks, security, and privacy concerns of performing an evaluation and management service by telephone and the potential availability of an in-person appointment in the future. The patient expressed understanding and agreed to proceed.  Location of Patient: Home Location of Provider (nurse):  In the office.  Subjective:    [name] is a 72 y.o. female patient of Thekkekandam, Ihor Austin, MD who had a Medicare Annual Wellness Visit today via telephone. [name] is Retired and lives alone. she has 2 children. she reports that she is socially active and does interact with friends/family regularly. she is minimally physically active and enjoys reading and drawing.  Patient Care Team: Monica Becton, MD as PCP - General (Sports Medicine) Andria Frames, MD as Referring Physician (Cardiology)     03/08/2023    3:13 PM 06/17/2022    1:42 PM 06/07/2022    7:59 AM 05/26/2022   11:00 AM 05/26/2022    5:03 AM 07/07/2021    6:33 PM 06/10/2021   11:17 AM  Advanced Directives  Does Patient Have a Medical Advance Directive? Yes Yes No No No Yes No  Type of Estate agent of [place];Living will     Healthcare Power of Attorney   Does patient want to make changes to medical advance directive? No - Patient declined        Copy of Healthcare Power of Attorney in Chart? Yes - validated most recent copy scanned in chart (See row information)     Yes - validated most recent copy scanned in chart (See row information)   Would patient like information on creating a medical advance directive?   No - Patient declined No - Patient  declined No - Patient declined  Yes (MAU/Ambulatory/Procedural Areas - Information given)    Hospital Utilization Over the Past 12 Months: # of hospitalizations or ER visits: 3 # of surgeries: 1  Review of Systems    Patient reports that her overall health is better compared to last year.  History obtained from chart review and the patient  Patient Reported Readings (BP, Pulse, CBG, Weight, etc) none  Pain Assessment Pain : 0-10 Pain Score: 4  Pain Type: Acute pain Pain Location: Head Pain Descriptors / Indicators: Dull, Headache Pain Onset: Today Pain Frequency: Constant     Current Medications & Allergies (verified) Allergies as of 03/08/2023   No Known Allergies      Medication List        Accurate as of Mar 08, 2023  3:32 PM. If you have any questions, ask your nurse or doctor.          AMBULATORY NON FORMULARY MEDICATION Medication Name: Rollator   AMBULATORY NON FORMULARY MEDICATION Motorized scooter for use daily   AMBULATORY NON FORMULARY MEDICATION Shower seat for use daily   apixaban 5 MG Tabs tablet Commonly known as: ELIQUIS Take 1 tablet (5 mg total) by mouth 2 (two) times daily.   buPROPion 150 MG 24 hr tablet Commonly known as: Wellbutrin XL Take 1 tablet (150 mg total) by mouth every morning.   flecainide 50 MG tablet Commonly known as: TAMBOCOR Take 50 mg by mouth 2 (two) times a day.  furosemide 40 MG tablet Commonly known as: LASIX TAKE 1 TABLET BY MOUTH DAILY   gabapentin 600 MG tablet Commonly known as: NEURONTIN Take 0.5 tablets (300 mg total) by mouth at bedtime.   icosapent Ethyl 1 g capsule Commonly known as: VASCEPA Take 2 capsules (2 g total) by mouth 2 (two) times daily.   irbesartan 75 MG tablet Commonly known as: AVAPRO TAKE ONE TABLET BY MOUTH DAILY   levothyroxine 75 MCG tablet Commonly known as: SYNTHROID TAKE ONE TABLET BY MOUTH DAILY WITH BREAKFAST   metoprolol succinate 50 MG 24 hr tablet Commonly  known as: TOPROL-XL Take 1 tablet (50 mg total) by mouth daily.   multivitamin with minerals tablet Take 1 tablet by mouth daily.   pantoprazole 40 MG tablet Commonly known as: PROTONIX TAKE ONE TABLET BY MOUTH DAILY   ramelteon 8 MG tablet Commonly known as: Rozerem 1 tablet p.o. at [facility]   spironolactone 25 MG tablet Commonly known as: ALDACTONE TAKE 1 TABLET BY MOUTH DAILY   traMADol 50 MG tablet Commonly known as: ULTRAM TAKE ONE TABLET BY MOUTH EVERY 8 HOURS AS NEEDED FOR MODERATE PAIN   traZODone 100 MG tablet Commonly known as: DESYREL Take 1 tablet (100 mg total) by mouth at bedtime.   venlafaxine XR 75 MG 24 hr capsule Commonly known as: EFFEXOR-XR Take 3 capsules (225 mg total) by mouth daily.   Wegovy 0.25 MG/0.5ML Soaj Generic drug: Semaglutide-Weight Management Inject 0.25 mg into the skin once a week. Use this dose for 1 month (4 shots) and then increase to next higher dose.        History (reviewed): Past Medical History:  Diagnosis Date   Anxiety    Atrial fibrillation (HCC)    Depression    GERD (gastroesophageal reflux disease)    Hypertension    Plaque psoriasis    Thyroid disease    Past Surgical History:  Procedure Laterality Date   BIOPSY  06/02/2022   Procedure: BIOPSY;  Surgeon: Jenel Lucks, MD;  Location: [facility] ENDOSCOPY;  Service: Gastroenterology;;   COLONOSCOPY N/A 06/02/2022   Procedure: COLONOSCOPY;  Surgeon: Jenel Lucks, MD;  Location: [facility] ENDOSCOPY;  Service: Gastroenterology;  Laterality: N/A;   ESOPHAGOGASTRODUODENOSCOPY N/A 06/02/2022   Procedure: ESOPHAGOGASTRODUODENOSCOPY (EGD);  Surgeon: Jenel Lucks, MD;  Location[facility] ENDOSCOPY;  Service: Gastroenterology;  Laterality: N/A;   LAPAROSCOPIC PARTIAL COLECTOMY N/A 06/07/2022   Procedure: LAPAROSCOPIC PARTIAL COLECTOMY; POSSIBLE OPEN PARTIAL COLECTOMY;  Surgeon: Axel Filler, MD;  Location: [facility] OR;  Service: General;  Laterality: N/A;   ORIF ANKLE FRACTURE      POLYPECTOMY  06/02/2022   Procedure: POLYPECTOMY;  Surgeon: Jenel Lucks, MD;  Location: [facility] ENDOSCOPY;  Service: Gastroenterology;;   SUBMUCOSAL TATTOO INJECTION  06/02/2022   Procedure: SUBMUCOSAL TATTOO INJECTION;  Surgeon: Jenel Lucks, MD;  Location: [facility] ENDOSCOPY;  Service: Gastroenterology;;   Family History  Problem Relation Age of Onset   Hypertension Mother    Hypertension Father    Cancer Father        colon   Hypertension Maternal Grandmother    Hypertension Maternal Grandfather    Heart attack Maternal Grandfather    Hypertension Paternal Grandmother    Stroke Paternal Grandfather    Cancer Daughter        daughter   Cancer Son        skin   Cancer Maternal Aunt        breast   Cancer Maternal Aunt  breast   Social History   Socioeconomic History   Marital status: Widowed    Spouse name: Not on file   Number of children: 2   Years of education: 14   Highest education level: Associate degree: academic program  Occupational History   Occupation: retired  Tobacco Use   Smoking status: Never   Smokeless tobacco: Never  Building services engineer Use: Never used  Substance and Sexual Activity   Alcohol use: Never   Drug use: Never   Sexual activity: Not on file  Other Topics Concern   Not on file  Social History Narrative   Lives alone but stays with her son often. She has two children. She enjoys reading and drawing.   Social Determinants of Health   Financial Resource Strain: Low Risk  (03/08/2023)   Overall Financial Resource Strain (CARDIA)    Difficulty of Paying Living Expenses: Not hard at all  Food Insecurity: No Food Insecurity (03/08/2023)   Hunger Vital Sign    Worried About Running Out of Food in the Last Year: Never true    Ran Out of Food in the Last Year: Never true  Transportation Needs: No Transportation Needs (03/08/2023)   PRAPARE - Administrator, Civil Service (Medical): No    Lack of Transportation  (Non-Medical): No  Physical Activity: Inactive (03/08/2023)   Exercise Vital Sign    Days of Exercise per Week: 0 days    Minutes of Exercise per Session: 0 min  Stress: Stress Concern Present (03/08/2023)   [employer] of Occupational Health - Occupational Stress Questionnaire    Feeling of Stress : Rather much  Social Connections: Moderately Integrated (03/08/2023)   Social Connection and Isolation Panel [NHANES]    Frequency of Communication with Friends and Family: More than three times a week    Frequency of Social Gatherings with Friends and Family: More than three times a week    Attends Religious Services: More than 4 times per year    Active Member of [employer] or Organizations: Yes    Attends Banker Meetings: More than 4 times per year    Marital Status: Widowed    Activities of Daily Living    03/08/2023    3:14 PM 06/17/2022   11:57 PM  In your present state of health, do you have any difficulty performing the following activities:  Hearing? 1 0  Comment wears hearing aid in one year.   Vision? 1 0  Comment needs cataract surgery in left eye   Difficulty concentrating or making decisions? 0 0  Walking or climbing stairs? 1 0  Comment difficulty with stairs   Dressing or bathing? 0 0  Doing errands, shopping? 1   Comment she doesn't drive   Preparing Food and eating ? N   Using the Toilet? N   In the past six months, have you accidently leaked urine? Y   Comment stress incontinence   Do you have problems with loss of bowel control? N   Managing your Medications? N   Managing your Finances? N   Housekeeping or managing your Housekeeping? N     Patient Education/ Literacy How often do you need to have someone help you when you read instructions, pamphlets, or other written materials from your doctor or pharmacy?: 1 - Never What is the last grade level you completed in school?: Associates degree  Exercise Current Exercise Habits: The patient does not  participate in regular exercise at  present, Exercise limited by: orthopedic condition(s)  Diet Patient reports consuming 2 meals a day and 0 snack(s) a day Patient reports that her primary diet is: Regular Patient reports that she does have regular access to food.   Depression Screen    03/08/2023    3:07 PM 07/07/2021    6:31 PM 06/08/2021    4:36 PM 05/29/2020    3:26 PM 08/17/2018    2:27 PM  PHQ 2/9 Scores  PHQ - 2 Score 0 1  4 0  PHQ- 9 Score    15 7  Exception Documentation   Other- indicate reason in comment box    Not completed   pt is grieving       Fall Risk    03/08/2023    3:07 PM 07/07/2021    6:34 PM 06/08/2021    4:36 PM  Fall Risk   Falls in the past year? 0 1 1  Number falls in past yr: 0 1 0  Injury with Fall? 0 0 0  Risk for fall due to : No Fall Risks Impaired vision;Impaired balance/gait;Impaired mobility Mental status change;Other (Comment)  Risk for fall due to: Comment   grief  Follow up Falls evaluation completed Falls prevention discussed Falls prevention discussed     Objective:  [name] seemed alert and oriented and she participated appropriately during our telephone visit.  Blood Pressure Weight BMI  BP Readings from Last 3 Encounters:  02/14/23 (!) 148/83  02/06/23 (!) 140/83  12/12/22 133/82   Wt Readings from Last 3 Encounters:  02/06/23 205 lb 14.6 oz (93.4 kg)  12/12/22 206 lb (93.4 kg)  07/11/22 210 lb 6.4 oz (95.4 kg)   BMI Readings from Last 1 Encounters:  02/06/23 36.48 kg/m    *Unable to obtain current vital signs, weight, and BMI due to telephone visit type  Hearing/Vision  [name] did not seem to have difficulty with hearing/understanding during the telephone conversation Reports that she has not had a formal eye exam by an eye care professional within the past year Reports that she has not had a formal hearing evaluation within the past year *Unable to fully assess hearing and vision during telephone visit type  Cognitive  Function:    03/08/2023    3:22 PM  6CIT Screen  What Year? 0 points  What month? 0 points  What time? 0 points  Count back from 20 0 points  Months in reverse 4 points  Repeat phrase 2 points  Total Score 6 points   (Normal:0-7, Significant for Dysfunction: >8)  Normal Cognitive Function Screening: Yes   Immunization & Health Maintenance Record Immunization History  Administered Date(s) Administered   PFIZER(Purple Top)SARS-COV-2 Vaccination 01/06/2020, 01/27/2020    Health Maintenance  Topic Date Due   DTaP/Tdap/Td (1 - Tdap) Never done   COVID-19 Vaccine (3 - Pfizer risk series) 03/24/2023 (Originally 02/24/2020)   Zoster Vaccines- Shingrix (1 of 2) 06/08/2023 (Originally 08/15/1970)   Pneumonia Vaccine 59+ Years old (1 of 1 - PCV) 03/07/2024 (Originally 08/16/2015)   MAMMOGRAM  03/07/2024 (Originally 04/18/2021)   DEXA SCAN  03/07/2024 (Originally 08/16/2015)   INFLUENZA VACCINE  06/01/2023   Medicare Annual Wellness (AWV)  03/07/2024   COLONOSCOPY (Pts 45-52yrs Insurance coverage will need to be confirmed)  06/02/2032   Hepatitis C Screening  Completed   HPV VACCINES  Aged Out   Fecal DNA (Cologuard)  Discontinued       Assessment  This is a routine wellness examination  for [name].  Health Maintenance: Due or Overdue Health Maintenance Due  Topic Date Due   DTaP/Tdap/Td (1 - Tdap) Never done    NEALY HOLGERSON does not need a referral for Community Assistance: Care Management:   no Social Work:    no Prescription Assistance:  no Nutrition/Diabetes Education:  no   Plan:  Personalized Goals  Goals Addressed               This Visit's Progress     Patient Stated (pt-stated)        Patient stated that she would like to loose weight.       Personalized Health Maintenance & Screening Recommendations  Pneumococcal vaccine  Td vaccine Shingles vaccine Mammogram  Bone density scan  Patient declined the pneumonia vaccine and shingles  vaccine.  Lung Cancer Screening Recommended: no (Low Dose CT Chest recommended if Age 48-80 years, 30 pack-year currently smoking OR have quit w/in past 15 years) Hepatitis C Screening recommended: no HIV Screening recommended: no  Advanced Directives: Written information was not prepared per patient's request.  Referrals & Orders Orders Placed This Encounter  Procedures   DEXAScan   Mammogram 3D SCREEN BREAST BILATERAL    Follow-up Plan Follow-up with Monica Becton, MD as planned Schedule tetanus vaccine at the pharmacy.  Medicare wellness visit in one year.  Patient will access AVS on mychart.   I have personally reviewed and noted the following in the patient's chart:   Medical and social history Use of alcohol, tobacco or illicit drugs  Current medications and supplements Functional ability and status Nutritional status Physical activity Advanced directives List of other physicians Hospitalizations, surgeries, and ER visits in previous 12 months Vitals Screenings to include cognitive, depression, and falls Referrals and appointments  In addition, I have reviewed and discussed with [name] certain preventive protocols, quality metrics, and best practice recommendations. A written personalized care plan for preventive services as well as general preventive health recommendations is available and can be mailed to the patient at her request.      Modesto Charon, RN BSN  03/08/2023

## 2023-04-04 ENCOUNTER — Other Ambulatory Visit: Payer: Self-pay | Admitting: Sports Medicine

## 2023-04-05 ENCOUNTER — Other Ambulatory Visit: Payer: Self-pay | Admitting: Sports Medicine

## 2023-04-11 ENCOUNTER — Telehealth (INDEPENDENT_AMBULATORY_CARE_PROVIDER_SITE_OTHER): Payer: 59 | Admitting: Sports Medicine

## 2023-04-11 MED ORDER — SEMAGLUTIDE (2 MG/DOSE) 8 MG/3ML ~~LOC~~ SOPN
PEN_INJECTOR | SUBCUTANEOUS | 3 refills | Status: DC
Start: 2023-04-11 — End: 2024-01-29

## 2023-04-11 NOTE — Progress Notes (Signed)
   Virtual Visit via WebEx/MyChart   I connected with  [name]  on 04/11/23 via WebEx/MyChart/Doximity Video and verified that I am speaking with the correct person using two identifiers.   I discussed the limitations, risks, security and privacy concerns of performing an evaluation and management service by WebEx/MyChart/Doximity Video, including the higher likelihood of inaccurate diagnosis and treatment, and the availability of in person appointments.  We also discussed the likely need of an additional face to face encounter for complete and high quality delivery of care.  I also discussed with the patient that there may be a patient responsible charge related to this service. The patient expressed understanding and wishes to proceed.  Provider location is in medical facility. Patient location is at their home, different from provider location. People involved in care of the patient during this telehealth encounter were myself, my nurse/medical assistant, and my front office/scheduling team member.  Review of Systems: No fevers, chills, night sweats, weight loss, chest pain, or shortness of breath.   Objective Findings:    General: Speaking full sentences, no audible heavy breathing.  Sounds alert and appropriately interactive.  Appears well.  Face symmetric.  Extraocular movements intact.  Pupils equal and round.  No nasal flaring or accessory muscle use visualized.  Independent interpretation of tests performed by another provider:   None.  Brief History, Exam, Impression, and Recommendations:    Morbid obesity (HCC) Pleasant 72 year old female, she is morbidly obese, we have tried to get her branded GLP-1's but we have been unable to. She is interested in compounded semaglutide so I will send this in, I would like to do a 3 month follow-up.   I discussed the above assessment and treatment plan with the patient. The patient was provided an opportunity to ask questions and all  were answered. The patient agreed with the plan and demonstrated an understanding of the instructions.   The patient was advised to call back or seek an in-person evaluation if the symptoms worsen or if the condition fails to improve as anticipated.   I provided 30 minutes of face to face and non-face-to-face time during this encounter date, time was needed to gather information, review chart, records, communicate/coordinate with staff remotely, as well as complete documentation.   ____________________________________________ Ihor Austin. Benjamin Stain, M.D., ABFM., CAQSM., AME. Primary Care and Sports Medicine [facility]  Adjunct Professor of Family Medicine  [place] [employer]  Restaurant manager, fast food

## 2023-04-11 NOTE — Assessment & Plan Note (Signed)
Pleasant 72 year old female, she is morbidly obese, we have tried to get her branded GLP-1's but we have been unable to. She is interested in compounded semaglutide so I will send this in, I would like to do a 3 month follow-up.

## 2023-04-12 ENCOUNTER — Other Ambulatory Visit: Payer: Self-pay | Admitting: Sports Medicine

## 2023-04-24 ENCOUNTER — Telehealth: Payer: 59 | Admitting: Family Medicine

## 2023-04-24 DIAGNOSIS — B027 Disseminated zoster: Secondary | ICD-10-CM | POA: Diagnosis not present

## 2023-04-24 MED ORDER — GABAPENTIN 300 MG PO CAPS
300.0000 mg | ORAL_CAPSULE | Freq: Two times a day (BID) | ORAL | 0 refills | Status: DC
Start: 2023-04-24 — End: 2023-05-17

## 2023-04-24 MED ORDER — VALACYCLOVIR HCL 1 G PO TABS
1000.0000 mg | ORAL_TABLET | Freq: Three times a day (TID) | ORAL | 0 refills | Status: AC
Start: 2023-04-24 — End: 2023-05-04

## 2023-04-24 NOTE — Progress Notes (Signed)
Virtual Visit Consent   [name], you are scheduled for a virtual visit with a [facility] provider today. Just as with appointments in the office, your consent must be obtained to participate. Your consent will be active for this visit and any virtual visit you may have with one of our providers in the next 365 days. If you have a MyChart account, a copy of this consent can be sent to you electronically.  As this is a virtual visit, video technology does not allow for your provider to perform a traditional examination. This may limit your provider's ability to fully assess your condition. If your provider identifies any concerns that need to be evaluated in person or the need to arrange testing (such as labs, EKG, etc.), we will make arrangements to do so. Although advances in technology are sophisticated, we cannot ensure that it will always work on either your end or our end. If the connection with a video visit is poor, the visit may have to be switched to a telephone visit. With either a video or telephone visit, we are not always able to ensure that we have a secure connection.  By engaging in this virtual visit, you consent to the provision of healthcare and authorize for your insurance to be billed (if applicable) for the services provided during this visit. Depending on your insurance coverage, you may receive a charge related to this service.  I need to obtain your verbal consent now. Are you willing to proceed with your visit today? SHAVELL NORED has provided verbal consent on 04/24/2023 for a virtual visit (video or telephone). Freddy Finner, NP  Date: 04/24/2023 1:33 PM  Virtual Visit via Video Note   I, Freddy Finner, connected with  [name]  ([id number], 1951/03/02) on 04/24/23 at  2:15 PM EDT by a video-enabled telemedicine application and verified that I am speaking with the correct person using two identifiers.  Location: Patient: Virtual Visit Location Patient:  Home Provider: Virtual Visit Location Provider: Home Office   I discussed the limitations of evaluation and management by telemedicine and the availability of in person appointments. The patient expressed understanding and agreed to proceed.    History of Present Illness: [name] is a 72 y.o. who identifies as a female who was assigned female at birth, and is being seen today for painful rash  Onset was greater than 72 hours ago- first day was Thursday. On back of neck- thought was a bug bite- some itching near bumps, some are blistering now. Associated symptoms are the spread to neck, shoulder, arm and chest- right side  Modifying factors are none  Denies chest pain, shortness of breath, fevers, chills  Exposure to sick contacts- unknown  Problems:  Patient Active Problem List   Diagnosis Date Noted   Ocular migraine 02/14/2023   Psoriasis 12/12/2022   Status post left hemicolectomy 07/08/2022   Benign positional vertigo 07/08/2022   Acute on chronic diastolic CHF (congestive heart failure) (HCC) 06/13/2022   Colitis 05/23/2022   Idiopathic insomnia 11/22/2021   Morbid obesity (HCC) 11/22/2021   Polypharmacy 10/06/2020   Primary osteoarthritis of both knees 05/29/2020   Lumbar spinal stenosis 03/27/2019   Mixed hyperlipidemia 08/19/2018   Annual physical exam 08/17/2018   Chronic atrial fibrillation (HCC) 08/17/2018   Benign essential hypertension 08/17/2018   Depression, major 08/17/2018   Hypothyroidism 08/17/2018   GERD (gastroesophageal reflux disease) 08/17/2018    Allergies: No Known Allergies Medications:  Current Outpatient Medications:    AMBULATORY NON FORMULARY MEDICATION, Medication Name: Rollator, Disp: 1 each, Rfl: 0   AMBULATORY NON FORMULARY MEDICATION, Motorized scooter for use daily, Disp: 1 each, Rfl: 0   AMBULATORY NON FORMULARY MEDICATION, Shower seat for use daily, Disp: 1 each, Rfl: 0   apixaban (ELIQUIS) 5 MG TABS tablet, Take 1 tablet (5 mg  total) by mouth 2 (two) times daily., Disp: 60 tablet, Rfl: 0   buPROPion (WELLBUTRIN XL) 150 MG 24 hr tablet, Take 1 tablet (150 mg total) by mouth every morning., Disp: 30 tablet, Rfl: 11   flecainide (TAMBOCOR) 50 MG tablet, Take 50 mg by mouth 2 (two) times a day., Disp: , Rfl:    furosemide (LASIX) 40 MG tablet, TAKE 1 TABLET BY MOUTH DAILY, Disp: 90 tablet, Rfl: 0   gabapentin (NEURONTIN) 600 MG tablet, Take 0.5 tablets (300 mg total) by mouth at bedtime., Disp: , Rfl:    icosapent Ethyl (VASCEPA) 1 g capsule, Take 2 capsules (2 g total) by mouth 2 (two) times daily., Disp: 360 capsule, Rfl: 3   irbesartan (AVAPRO) 75 MG tablet, TAKE 1 TABLET BY MOUTH DAILY, Disp: 90 tablet, Rfl: 0   levothyroxine (SYNTHROID) 75 MCG tablet, TAKE ONE TABLET BY MOUTH DAILY WITH BREAKFAST, Disp: 90 tablet, Rfl: 3   metoprolol succinate (TOPROL-XL) 50 MG 24 hr tablet, Take 1 tablet (50 mg total) by mouth daily., Disp: 90 tablet, Rfl: 3   Multiple Vitamins-Minerals (MULTIVITAMIN WITH MINERALS) tablet, Take 1 tablet by mouth daily., Disp: , Rfl:    pantoprazole (PROTONIX) 40 MG tablet, TAKE ONE TABLET BY MOUTH DAILY, Disp: 90 tablet, Rfl: 3   ramelteon (ROZEREM) 8 MG tablet, 1 tablet p.o. at [place], Disp: 90 tablet, Rfl: 3   Semaglutide, 2 MG/DOSE, 8 MG/3ML SOPN, Semaglutide 2.5 mg/mL + Vit B6 10mg /mL.Inject 10u/0.35mL/0.25 mg subcu weekly x4 weeks then 20u/0.44mL/0.5 mg subcu weekly x4 weeks, then 40u/0.6mL/1 mg subcu weekly x4 weeks then 68u/0.40mL/1.7mg  subcu weekly x4 weeks then 100u/22mL/2.5mg  subcu weekly., Disp: 2 mL, Rfl: 3   spironolactone (ALDACTONE) 25 MG tablet, TAKE 1 TABLET BY MOUTH DAILY, Disp: 90 tablet, Rfl: 0   traMADol (ULTRAM) 50 MG tablet, TAKE ONE TABLET BY MOUTH EVERY 8 HOURS AS NEEDED FOR MODERATE PAIN, Disp: 30 tablet, Rfl: 3   traZODone (DESYREL) 100 MG tablet, Take 1 tablet (100 mg total) by mouth at bedtime., Disp: 90 tablet, Rfl: 3   venlafaxine XR (EFFEXOR-XR) 75 MG 24 hr capsule, Take 3  capsules (225 mg total) by mouth daily., Disp: 90 capsule, Rfl: 11  Observations/Objective: Patient is well-developed, well-nourished in no acute distress.  Resting comfortably  at home.  Head is normocephalic, atraumatic.  No labored breathing.  Speech is clear and coherent with logical content.  Patient is alert and oriented at baseline.    Assessment and Plan:  1. Disseminated herpes zoster  - valACYclovir (VALTREX) 1000 MG tablet; Take 1 tablet (1,000 mg total) by mouth 3 (three) times daily for 10 days.  Dispense: 30 tablet; Refill: 0 - gabapentin (NEURONTIN) 300 MG capsule; Take 1 capsule (300 mg total) by mouth 2 (two) times daily.  Dispense: 30 capsule; Refill: 0  -take medications as discussed -shingles info on AVS -follow up ASAP in person for strict precautions  Reviewed side effects, risks and benefits of medication.    Patient acknowledged agreement and understanding of the plan.   Past Medical, Surgical, Social History, Allergies, and Medications have been Reviewed.    Follow  Up Instructions: I discussed the assessment and treatment plan with the patient. The patient was provided an opportunity to ask questions and all were answered. The patient agreed with the plan and demonstrated an understanding of the instructions.  A copy of instructions were sent to the patient via MyChart unless otherwise noted below.     The patient was advised to call back or seek an in-person evaluation if the symptoms worsen or if the condition fails to improve as anticipated.  Time:  I spent 10 minutes with the patient via telehealth technology discussing the above problems/concerns.    Freddy Finner, NP

## 2023-04-24 NOTE — Patient Instructions (Signed)
Elder Love, thank you for joining Freddy Finner, NP for today's virtual visit.  While this provider is not your primary care provider (PCP), if your PCP is located in our provider database this encounter information will be shared with them immediately following your visit.   A [place] MyChart account gives you access to today's visit and all your visits, tests, and labs performed at [facility] " click here if you don't have a [place] MyChart account or go to [email or web address]  Consent: (Patient) [name] provided verbal consent for this virtual visit at the beginning of the encounter.  Current Medications:  Current Outpatient Medications:    gabapentin (NEURONTIN) 300 MG capsule, Take 1 capsule (300 mg total) by mouth 2 (two) times daily., Disp: 30 capsule, Rfl: 0   valACYclovir (VALTREX) 1000 MG tablet, Take 1 tablet (1,000 mg total) by mouth 3 (three) times daily for 10 days., Disp: 30 tablet, Rfl: 0   AMBULATORY NON FORMULARY MEDICATION, Medication Name: Rollator, Disp: 1 each, Rfl: 0   AMBULATORY NON FORMULARY MEDICATION, Motorized scooter for use daily, Disp: 1 each, Rfl: 0   AMBULATORY NON FORMULARY MEDICATION, Shower seat for use daily, Disp: 1 each, Rfl: 0   apixaban (ELIQUIS) 5 MG TABS tablet, Take 1 tablet (5 mg total) by mouth 2 (two) times daily., Disp: 60 tablet, Rfl: 0   buPROPion (WELLBUTRIN XL) 150 MG 24 hr tablet, Take 1 tablet (150 mg total) by mouth every morning., Disp: 30 tablet, Rfl: 11   flecainide (TAMBOCOR) 50 MG tablet, Take 50 mg by mouth 2 (two) times a day., Disp: , Rfl:    furosemide (LASIX) 40 MG tablet, TAKE 1 TABLET BY MOUTH DAILY, Disp: 90 tablet, Rfl: 0   icosapent Ethyl (VASCEPA) 1 g capsule, Take 2 capsules (2 g total) by mouth 2 (two) times daily., Disp: 360 capsule, Rfl: 3   irbesartan (AVAPRO) 75 MG tablet, TAKE 1 TABLET BY MOUTH DAILY, Disp: 90 tablet, Rfl: 0   levothyroxine (SYNTHROID) 75 MCG tablet, TAKE ONE TABLET  BY MOUTH DAILY WITH BREAKFAST, Disp: 90 tablet, Rfl: 3   metoprolol succinate (TOPROL-XL) 50 MG 24 hr tablet, Take 1 tablet (50 mg total) by mouth daily., Disp: 90 tablet, Rfl: 3   Multiple Vitamins-Minerals (MULTIVITAMIN WITH MINERALS) tablet, Take 1 tablet by mouth daily., Disp: , Rfl:    pantoprazole (PROTONIX) 40 MG tablet, TAKE ONE TABLET BY MOUTH DAILY, Disp: 90 tablet, Rfl: 3   ramelteon (ROZEREM) 8 MG tablet, 1 tablet p.o. at [place], Disp: 90 tablet, Rfl: 3   Semaglutide, 2 MG/DOSE, 8 MG/3ML SOPN, Semaglutide 2.5 mg/mL + Vit B6 10mg /mL.Inject 10u/0.28mL/0.25 mg subcu weekly x4 weeks then 20u/0.10mL/0.5 mg subcu weekly x4 weeks, then 40u/0.38mL/1 mg subcu weekly x4 weeks then 68u/0.1mL/1.7mg  subcu weekly x4 weeks then 100u/7mL/2.5mg  subcu weekly., Disp: 2 mL, Rfl: 3   spironolactone (ALDACTONE) 25 MG tablet, TAKE 1 TABLET BY MOUTH DAILY, Disp: 90 tablet, Rfl: 0   traMADol (ULTRAM) 50 MG tablet, TAKE ONE TABLET BY MOUTH EVERY 8 HOURS AS NEEDED FOR MODERATE PAIN, Disp: 30 tablet, Rfl: 3   traZODone (DESYREL) 100 MG tablet, Take 1 tablet (100 mg total) by mouth at bedtime., Disp: 90 tablet, Rfl: 3   venlafaxine XR (EFFEXOR-XR) 75 MG 24 hr capsule, Take 3 capsules (225 mg total) by mouth daily., Disp: 90 capsule, Rfl: 11   Medications ordered in this encounter:  Meds ordered this encounter  Medications   valACYclovir (VALTREX) 1000 MG  tablet    Sig: Take 1 tablet (1,000 mg total) by mouth 3 (three) times daily for 10 days.    Dispense:  30 tablet    Refill:  0    Order Specific Question:   Supervising Provider    Answer:   Merrilee Jansky [[id number]]   gabapentin (NEURONTIN) 300 MG capsule    Sig: Take 1 capsule (300 mg total) by mouth 2 (two) times daily.    Dispense:  30 capsule    Refill:  0    Order Specific Question:   Supervising Provider    Answer:   Merrilee Jansky [id number]     *If you need refills on other medications prior to your next appointment, please contact your  pharmacy*  Follow-Up: Call back or seek an in-person evaluation if the symptoms worsen or if the condition fails to improve as anticipated.  [facility] [phone]  Other Instructions  Shingles  Shingles is an infection. It gives you a painful skin rash and blisters that have fluid in them. Shingles is caused by the same germ (virus) that causes chickenpox. Shingles only happens in people who: Have had chickenpox. Have been given a shot (vaccine) to protect against chickenpox. Shingles is rare in this group. What are the causes? This condition is caused by varicella-zoster virus. This is the same germ that causes chickenpox. After a person is exposed to the germ, the germ stays in the body but is not active (dormant). Shingles develops if the germ becomes active again (is reactivated). This can happen many years after the first exposure to the germ. It is not known what causes this germ to become active again. What increases the risk? People who have had chickenpox or received the chickenpox shot are at risk for shingles. This infection is more common in people who: Are older than 73 years of age. Have a weakened disease-fighting system (immune system), such as people with: HIV (human immunodeficiency virus). AIDS (acquired immunodeficiency syndrome). Cancer. Are taking medicines that weaken the immune system, such as organ transplant medicines. Have a lot of stress. What are the signs or symptoms? The first symptoms of shingles may be itching, tingling, or pain in an area on your skin. A rash will show on your skin a few days or weeks later. This is what usually happens: The rash is likely to be on one side of your body. The rash usually has a shape like a belt or a band. Over time, the rash turns into fluid-filled blisters. The blisters will break open and change into scabs. The scabs usually dry up in about 2-3 weeks. You may also have: A fever. Chills. A  headache. A feeling like you may vomit (nausea). How is this treated? The rash may last for several weeks. There is not a specific cure for this condition. Your doctor may prescribe medicines. Medicines may: Help with pain. Help you get better sooner. Help to prevent long-term problems. Help with itching (antihistamines). If the area involved is on your face, you may need to see a specialist. This may be an eye doctor or an ear, nose, and throat (ENT) doctor. Follow these instructions at home: Medicines Take over-the-counter and prescription medicines only as told by your doctor. Put on an anti-itch cream or numbing cream where you have a rash, blisters, or scabs. Do this as told by your doctor. Helping with itching and discomfort  Put cold, wet cloths (cold compresses) on the area of  the rash or blisters as told by your doctor. Cool baths can help you feel better. Try adding baking soda or dry oatmeal to the water to lessen itching. Do not bathe in hot water. Use calamine lotion as told by your doctor. Blister and rash care Keep your rash covered with a loose bandage (dressing). Wear loose clothing that does not rub on your rash. Wash your hands with soap and water for at least 20 seconds before and after you change your bandage. If you cannot use soap and water, use hand sanitizer. Change your bandage as told by your doctor. Keep your rash and blisters clean. To do this, wash the area with mild soap and cool water as told by your doctor. Check your rash every day for signs of infection. Check for: More redness, swelling, or pain. Fluid or blood. Warmth. Pus or a bad smell. Do not scratch your rash. Do not pick at your blisters. To help you to not scratch: Keep your fingernails clean and cut short. Wear gloves or mittens when you sleep, if scratching is a problem. General instructions Rest as told by your doctor. Wash your hands often with soap and water for at least 20 seconds.  If you cannot use soap and water, use hand sanitizer. Doing this lowers your chance of getting a skin infection. Your infection can cause chickenpox in people who have never had chickenpox or never got a chickenpox vaccine shot. If you have blisters that did not change into scabs yet, try not to touch other people or be around other people, especially: Babies. Pregnant women. Children who have areas of red, itchy, or rough skin (eczema). Older people who have organ transplants. People who have a long-term (chronic) illness, like cancer or AIDS. Keep all follow-up visits. How is this prevented? A vaccine shot is the best way to prevent shingles and protect against shingles problems. If you have not had a vaccine shot, talk with your doctor about getting it. Where to find more information Centers for Disease Control and Prevention: FootballExhibition.com.br Contact a doctor if: Your pain does not get better with medicine. Your pain does not get better after the rash heals. You have any of these signs of infection around the rash: More redness, swelling, or pain. Fluid or blood. Warmth. Pus or a bad smell. You have a fever. Get help right away if: The rash is on your face or nose. You have pain in your face or pain by your eye. You lose feeling on one side of your face. You have trouble seeing. You have ear pain, or you have ringing in your ear. You have a loss of taste. Your condition gets worse. Summary Shingles gives you a painful skin rash and blisters that have fluid in them. Shingles is caused by the same germ (virus) that causes chickenpox. Keep your rash covered with a loose bandage. Wear loose clothing that does not rub on your rash. If you have blisters that did not change into scabs yet, try not to touch other people or be around people. This information is not intended to replace advice given to you by your health care provider. Make sure you discuss any questions you have with your  health care provider. Document Revised: 10/12/2020 Document Reviewed: 10/12/2020 Elsevier Patient Education  2024 Elsevier Inc.    If you have been instructed to have an in-person evaluation today at a local Urgent Care facility, please use the link below. It will take you to a  list of all of our available [facility], including address, phone number and hours of operation. Please do not delay care.  [facility]  If you or a family member do not have a primary care provider, use the link below to schedule a visit and establish care. When you choose a [facility] primary care physician or advanced practice provider, you gain a long-term partner in health. Find a Primary Care Provider  Learn more about [facility]'s in-office and virtual care options:  - Get Care Now

## 2023-04-26 ENCOUNTER — Other Ambulatory Visit: Payer: Self-pay | Admitting: Sports Medicine

## 2023-04-26 DIAGNOSIS — K219 Gastro-esophageal reflux disease without esophagitis: Secondary | ICD-10-CM

## 2023-04-27 ENCOUNTER — Encounter: Payer: Self-pay | Admitting: Sports Medicine

## 2023-05-05 ENCOUNTER — Ambulatory Visit: Payer: 59 | Admitting: Sports Medicine

## 2023-05-17 ENCOUNTER — Ambulatory Visit (INDEPENDENT_AMBULATORY_CARE_PROVIDER_SITE_OTHER): Payer: 59 | Admitting: Sports Medicine

## 2023-05-17 DIAGNOSIS — M48061 Spinal stenosis, lumbar region without neurogenic claudication: Secondary | ICD-10-CM

## 2023-05-17 DIAGNOSIS — L404 Guttate psoriasis: Secondary | ICD-10-CM | POA: Diagnosis not present

## 2023-05-17 MED ORDER — TRIAMCINOLONE ACETONIDE 0.5 % EX OINT
1.0000 | TOPICAL_OINTMENT | Freq: Two times a day (BID) | CUTANEOUS | 11 refills | Status: AC
Start: 2023-05-17 — End: ?

## 2023-05-17 MED ORDER — GABAPENTIN 300 MG PO CAPS
ORAL_CAPSULE | ORAL | 11 refills | Status: DC
Start: 2023-05-17 — End: 2024-09-03

## 2023-05-17 NOTE — Progress Notes (Signed)
    Procedures performed today:    None.  Independent interpretation of notes and tests performed by another provider:   None.  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 On exam she has multiple macular lesions with crusting, they do hurt to some degree. 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 She will take lukewarm showers and moisturize afterwards. She will continue this and if still present after a month we will biopsy. She does have a dermatologist.  Lumbar spinal stenosis Currently on gabapentin which helps the pain in her feet, she has a remarkably normal lumbar spine MRI, she is overall doing well with gabapentin, 600 mg nightly and is interested in going up on the dose. Will go up to 900 mg nightly, she can add 1 to 2 pills during the day as well.    ____________________________________________ Ihor [place]. Benjamin Stain, M.D., ABFM., CAQSM., AME. Primary Care and Sports Medicine [facility]  Adjunct Professor of Family Medicine  [employer]  Restaurant manager, fast food

## 2023-05-17 NOTE — Assessment & Plan Note (Signed)
Currently on gabapentin which helps the pain in her feet, she has a remarkably normal lumbar spine MRI, she is overall doing well with gabapentin, 600 mg nightly and is interested in going up on the dose. Will go up to 900 mg nightly, she can add 1 to 2 pills during the day as well.

## 2023-05-17 NOTE — Assessment & Plan Note (Signed)
[name] is a pleasant 72 year old female with a known history of psoriasis, recently on Skyrizi but it was stopped, she developed a rash to chest, neck, shoulders, bilateral and crossing the midline. On exam she has multiple macular lesions with crusting, they do hurt to some degree. 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 She will take lukewarm showers and moisturize afterwards. She will continue this and if still present after a month we will biopsy. She does have a dermatologist.

## 2023-05-23 ENCOUNTER — Other Ambulatory Visit: Payer: Self-pay | Admitting: Sports Medicine

## 2023-06-03 ENCOUNTER — Other Ambulatory Visit: Payer: Self-pay | Admitting: Sports Medicine

## 2023-06-03 DIAGNOSIS — I482 Chronic atrial fibrillation, unspecified: Secondary | ICD-10-CM

## 2023-06-07 ENCOUNTER — Encounter (INDEPENDENT_AMBULATORY_CARE_PROVIDER_SITE_OTHER): Payer: 59 | Admitting: Sports Medicine

## 2023-06-07 DIAGNOSIS — H538 Other visual disturbances: Secondary | ICD-10-CM

## 2023-06-08 DIAGNOSIS — H539 Unspecified visual disturbance: Secondary | ICD-10-CM | POA: Insufficient documentation

## 2023-06-08 DIAGNOSIS — H538 Other visual disturbances: Secondary | ICD-10-CM | POA: Insufficient documentation

## 2023-06-08 NOTE — Telephone Encounter (Signed)
I spent 5 total minutes of online digital evaluation and management services in this patient-initiated request for online care. 

## 2023-06-08 NOTE — Assessment & Plan Note (Signed)
>>  ASSESSMENT AND PLAN FOR BLURRY VISION WRITTEN ON 06/08/2023  4:21 PM BY THEKKEKANDAM, DEBBY PARAS, MD  Increasing blurry vision, history of ocular migraines, patient is requesting optometry referral, happy to place this.

## 2023-06-08 NOTE — Assessment & Plan Note (Signed)
Increasing blurry vision, history of ocular migraines, patient is requesting optometry referral, happy to place this.

## 2023-06-09 ENCOUNTER — Ambulatory Visit (INDEPENDENT_AMBULATORY_CARE_PROVIDER_SITE_OTHER): Payer: 59 | Admitting: Family Medicine

## 2023-06-09 ENCOUNTER — Encounter: Payer: Self-pay | Admitting: Family Medicine

## 2023-06-09 VITALS — BP 134/84 | HR 69 | Resp 18 | Ht 63.0 in | Wt 209.2 lb

## 2023-06-09 DIAGNOSIS — H539 Unspecified visual disturbance: Secondary | ICD-10-CM | POA: Diagnosis not present

## 2023-06-09 LAB — POCT GLYCOSYLATED HEMOGLOBIN (HGB A1C): Hemoglobin A1C: 5.4 % (ref 4.0–5.6)

## 2023-06-09 NOTE — Assessment & Plan Note (Signed)
Pleasant 72 year old female presents today with concerns of bilateral visual blurriness that has been going on for a few months now.  Per epic review she has had negative MRI brain.  A1c done in clinic today to rule out that this is the cause of elevated sugars.  A1c was 5.4 which is improved from 6.1 on previous draw.  She is still taking her semaglutide injections and has been on them for quite some time per epic chart review.  We will go ahead and order an MRA brain to rule out any type of TIA. Will also refer to ophthalmology for further evaluation of the eye to see if this is an eye issue - differential includes: TIA, cataracts (less likely with coming and going), MS (no family history or balance issues), hyperglycemia (less likely with controlled A1c on visit today), side effect from semaglutide  - did give pt strict ED precautions that include: any changes in duration or characteristic of symptoms

## 2023-06-09 NOTE — Progress Notes (Signed)
Established patient visit   Patient: [name]   DOB: 10/27/1951   72 y.o. Female  MRN: [id number] Visit Date: 06/09/2023  Today's healthcare provider: Charlton Amor, DO   Chief Complaint  Patient presents with   Blurred Vision    Pt comes in with complaints of blurred vision that's intermittent x2-3 wks    SUBJECTIVE    Chief Complaint  Patient presents with   Blurred Vision    Pt comes in with complaints of blurred vision that's intermittent x2-3 wks   HPI  Pt presents for acute care visit for vision changes for two weeks. She was told by optometrist it does not appear to be an eye problem. Pt has a history of ocular migraines. She says her vision comes and goes. When she has these vision events they last .  She describes the vision as the blurring of her vision and it happens bilaterally.  She says that it is not a dark curtain or blackness that she sees.  She is unable to pinpoint any incident that causes this event to happen.  She has not been under more stress.  She does not find any correlation with time of the day or electronic/computer use.  She does say this has been going on for a few months.  She has been on semaglutide.  Per Epic chart review, pt had monocular vision loss in April 2024 and was seen in urgent care that obtained and MRI. MRI was unremarkable.  Review of Systems  Constitutional:  Negative for activity change, fatigue and fever.  Eyes:  Positive for visual disturbance.  Respiratory:  Negative for cough and shortness of breath.   Cardiovascular:  Negative for chest pain.  Gastrointestinal:  Negative for abdominal pain.  Genitourinary:  Negative for difficulty urinating.       Current Meds  Medication Sig   AMBULATORY NON FORMULARY MEDICATION Medication Name: Rollator   AMBULATORY NON FORMULARY MEDICATION Motorized scooter for use daily   AMBULATORY NON FORMULARY MEDICATION Shower seat for use daily   buPROPion (WELLBUTRIN XL) 150 MG  24 hr tablet Take 1 tablet (150 mg total) by mouth every morning.   ELIQUIS 5 MG TABS tablet TAKE 1 TABLET BY MOUTH TWICE A DAY   flecainide (TAMBOCOR) 50 MG tablet Take 50 mg by mouth 2 (two) times a day.   furosemide (LASIX) 40 MG tablet TAKE 1 TABLET BY MOUTH DAILY   gabapentin (NEURONTIN) 300 MG capsule Start 3 capsules p.o. nightly, may add a dose in the morning and/or midday if needed   icosapent Ethyl (VASCEPA) 1 g capsule Take 2 capsules (2 g total) by mouth 2 (two) times daily.   irbesartan (AVAPRO) 75 MG tablet TAKE 1 TABLET BY MOUTH DAILY   levothyroxine (SYNTHROID) 75 MCG tablet TAKE ONE TABLET BY MOUTH DAILY WITH BREAKFAST   metoprolol succinate (TOPROL-XL) 50 MG 24 hr tablet Take 1 tablet (50 mg total) by mouth daily.   Multiple Vitamins-Minerals (MULTIVITAMIN WITH MINERALS) tablet Take 1 tablet by mouth daily.   pantoprazole (PROTONIX) 40 MG tablet TAKE 1 TABLET BY MOUTH DAILY   Semaglutide, 2 MG/DOSE, 8 MG/3ML SOPN Semaglutide 2.5 mg/mL + Vit B6 10mg /mL.Inject 10u/0.44mL/0.25 mg subcu weekly x4 weeks then 20u/0.21mL/0.5 mg subcu weekly x4 weeks, then 40u/0.57mL/1 mg subcu weekly x4 weeks then 68u/0.42mL/1.7mg  subcu weekly x4 weeks then 100u/35mL/2.5mg  subcu weekly.   spironolactone (ALDACTONE) 25 MG tablet TAKE 1 TABLET BY MOUTH DAILY   traMADol (ULTRAM) 50  MG tablet TAKE ONE TABLET BY MOUTH EVERY 8 HOURS AS NEEDED FOR MODERATE PAIN   traZODone (DESYREL) 100 MG tablet TAKE ONE TABLET BY MOUTH EVERY NIGHT AT BEDTIME   traZODone (DESYREL) 100 MG tablet Take 1 tablet (100 mg total) by mouth at bedtime.   triamcinolone ointment (KENALOG) 0.5 % Apply 1 Application topically 2 (two) times daily. To affected area, avoid eyes and face   venlafaxine XR (EFFEXOR-XR) 75 MG 24 hr capsule Take 3 capsules (225 mg total) by mouth daily.    OBJECTIVE    BP 134/84 (BP Location: Right Arm, Patient Position: Sitting, Cuff Size: Normal)   Pulse 69   Resp 18   Ht 5\' 3"  (1.6 m)   Wt 209 lb 4 oz (94.9  kg)   SpO2 96%   BMI 37.07 kg/m   Physical Exam Vitals and nursing note reviewed.  Constitutional:      General: She is not in acute distress.    Appearance: Normal appearance.  HENT:     Head: Normocephalic and atraumatic.     Right Ear: External ear normal.     Left Ear: External ear normal.     Nose: Nose normal.  Eyes:     Conjunctiva/sclera: Conjunctivae normal.  Cardiovascular:     Rate and Rhythm: Normal rate and regular rhythm.  Pulmonary:     Effort: Pulmonary effort is normal.     Breath sounds: Normal breath sounds.  Neurological:     General: No focal deficit present.     Mental Status: She is alert and oriented to person, place, and time.     Comments: CN II-XII intact bilaterally  Psychiatric:        Mood and Affect: Mood normal.        Behavior: Behavior normal.        Thought Content: Thought content normal.        Judgment: Judgment normal.        ASSESSMENT & PLAN    Problem List Items Addressed This Visit       Other   Vision changes - Primary    Pleasant 72 year old female presents today with concerns of bilateral visual blurriness that has been going on for a few months now.  Per epic review she has had negative MRI brain.  A1c done in clinic today to rule out that this is the cause of elevated sugars.  A1c was 5.4 which is improved from 6.1 on previous draw.  She is still taking her semaglutide injections and has been on them for quite some time per epic chart review.  We will go ahead and order an MRA brain to rule out any type of TIA. Will also refer to ophthalmology for further evaluation of the eye to see if this is an eye issue - differential includes: TIA, cataracts (less likely with coming and going), MS (no family history or balance issues), hyperglycemia (less likely with controlled A1c on visit today), side effect from semaglutide  - did give pt strict ED precautions that include: any changes in duration or characteristic of symptoms       Relevant Orders   Ambulatory referral to Ophthalmology   MR BRAIN W WO CONTRAST   POCT HgB A1C (Completed)   was spent with the patient discussing symptoms, doing physical exam, and discussing treatment plan  Return if symptoms worsen or fail to improve.      No orders of the defined types were placed in this  encounter.   Orders Placed This Encounter  Procedures   MR BRAIN W WO CONTRAST    Standing Status:   Future    Standing Expiration Date:   06/08/2024    Order Specific Question:   If indicated for the ordered procedure, I authorize the administration of contrast media per Radiology protocol    Answer:   Yes    Order Specific Question:   What is the patient's sedation requirement?    Answer:   No Sedation    Order Specific Question:   Does the patient have a pacemaker or implanted devices?    Answer:   No    Order Specific Question:   Preferred imaging location?    Answer:   [facility] (table limit-350lbs)   Ambulatory referral to Ophthalmology    Referral Priority:   Routine    Referral Type:   Consultation    Referral Reason:   Specialty Services Required    Requested Specialty:   Ophthalmology    Number of Visits Requested:   1   POCT HgB A1C     Charlton Amor, DO  [facility] at [facility] [phone] (phone) [phone] (fax)  [facility]

## 2023-06-15 ENCOUNTER — Telehealth: Payer: Self-pay | Admitting: Family Medicine

## 2023-06-15 NOTE — Telephone Encounter (Signed)
Patient called in for an update on her MRI and Ophthalmology. Please advise

## 2023-06-16 ENCOUNTER — Telehealth: Payer: Self-pay | Admitting: Sports Medicine

## 2023-06-16 NOTE — Telephone Encounter (Signed)
LM for pt that referral for ophthalmology was processed on 06/13/23 and MRI is still pending. Pt can reach out to [facility] and get scheduled if she would like. Roselyn Reef, CMA

## 2023-06-16 NOTE — Telephone Encounter (Signed)
Pt aware of appointment   06/27/2023 at 2:10 pm Dr. Carollee Massed. This appointment will be located at    [facility]  [address] [place], [place] [address]   [phone]  Referral, clinical notes, demographics and copies of insurance cards have been faxed to [facility] at [phone].

## 2023-06-16 NOTE — Telephone Encounter (Signed)
Pt  called office requesting Opthalmathology referral be called into [facility]. Pt last saw Dr. Wynelle Link 02/21/2022.

## 2023-06-16 NOTE — Telephone Encounter (Signed)
Contacted [employer] ( Spoke with Tammy)- scheduled 06/27/2023 at 2:10 pm Dr. Carollee Massed. This appointment will be located at   [facility]  [address] [place], [place] [address]  [phone]  Pt will need a driver, Pt may receive a 34% bill, bring current insurance, Photo ID, and current list of current medications.

## 2023-06-16 NOTE — Telephone Encounter (Signed)
06/16/2023- Left message on patients voicemail to obtain info from patient regarding optometrist.

## 2023-06-16 NOTE — Telephone Encounter (Signed)
Contacted [facility] [facility] ( Spoke with Meriam Sprague -Corporate treasurer)- to call ophthalmology referral in. Office declined referral stating patient needed to see Optometrist first for regular eye exam and Optometrist would refer to ophthalmology.   Per Meriam Sprague, she will contact patient once she contact's Dr. Logan Bores office.

## 2023-06-17 ENCOUNTER — Other Ambulatory Visit: Payer: Self-pay | Admitting: Sports Medicine

## 2023-06-17 DIAGNOSIS — F329 Major depressive disorder, single episode, unspecified: Secondary | ICD-10-CM

## 2023-06-25 DIAGNOSIS — H2512 Age-related nuclear cataract, left eye: Secondary | ICD-10-CM | POA: Diagnosis not present

## 2023-06-26 DIAGNOSIS — I4819 Other persistent atrial fibrillation: Secondary | ICD-10-CM | POA: Diagnosis not present

## 2023-06-26 DIAGNOSIS — I10 Essential (primary) hypertension: Secondary | ICD-10-CM | POA: Diagnosis not present

## 2023-06-26 DIAGNOSIS — I361 Nonrheumatic tricuspid (valve) insufficiency: Secondary | ICD-10-CM | POA: Diagnosis not present

## 2023-07-01 ENCOUNTER — Other Ambulatory Visit: Payer: Self-pay | Admitting: Sports Medicine

## 2023-07-01 DIAGNOSIS — M48061 Spinal stenosis, lumbar region without neurogenic claudication: Secondary | ICD-10-CM

## 2023-07-04 ENCOUNTER — Other Ambulatory Visit: Payer: Self-pay | Admitting: Sports Medicine

## 2023-07-04 DIAGNOSIS — F329 Major depressive disorder, single episode, unspecified: Secondary | ICD-10-CM

## 2023-07-10 NOTE — Telephone Encounter (Signed)
Opened in error

## 2023-07-16 ENCOUNTER — Other Ambulatory Visit: Payer: Self-pay | Admitting: Sports Medicine

## 2023-07-26 DIAGNOSIS — I4819 Other persistent atrial fibrillation: Secondary | ICD-10-CM | POA: Diagnosis not present

## 2023-07-26 DIAGNOSIS — I10 Essential (primary) hypertension: Secondary | ICD-10-CM | POA: Diagnosis not present

## 2023-08-08 ENCOUNTER — Other Ambulatory Visit: Payer: Self-pay | Admitting: Sports Medicine

## 2023-08-08 ENCOUNTER — Encounter: Payer: Self-pay | Admitting: Sports Medicine

## 2023-08-08 ENCOUNTER — Telehealth: Payer: Self-pay | Admitting: Sports Medicine

## 2023-08-08 NOTE — Telephone Encounter (Signed)
Prescription Request  08/08/2023  LOV: 05/17/2023  What is the name of the medication or equipment? ELIQUIS 5 MG TABS tablet   Have you contacted your pharmacy to request a refill? Yes   Which pharmacy would you like this sent to?   [facility] [id number] - [place], [place] - [address] [place] [address] Phone: [phone] Fax: [phone]     Patient notified that their request is being sent to the clinical staff for review and that they should receive a response within 2 business days.   Please advise at [facility] [phone]

## 2023-09-03 ENCOUNTER — Other Ambulatory Visit: Payer: Self-pay | Admitting: Sports Medicine

## 2023-09-29 ENCOUNTER — Other Ambulatory Visit: Payer: Self-pay | Admitting: Sports Medicine

## 2023-10-03 ENCOUNTER — Other Ambulatory Visit: Payer: Self-pay | Admitting: Sports Medicine

## 2023-10-13 ENCOUNTER — Other Ambulatory Visit: Payer: Self-pay | Admitting: Sports Medicine

## 2023-10-13 ENCOUNTER — Ambulatory Visit: Payer: Self-pay | Admitting: Sports Medicine

## 2023-10-13 NOTE — Telephone Encounter (Signed)
  Chief Complaint: Toe injury Symptoms: cut toenail "wrong" 1 to 2 weeks ago Frequency: 1-2 weeks ago Pertinent Negatives: Patient denies fever or pain Disposition: [] ED /[] Urgent Care (no appt availability in office) / [x] Appointment(In office/virtual)/ []  [facility]/ [] Home Care/ [] Refused Recommended Disposition /[] [place] [place] [facility]/ []  Follow-up with PCP Additional Notes: pt called with concerns for toe injury. Patient states that while clipping her toenails a couple of weeks ago, patient clipped her big toe "wrong" causing it to bleed. Patient states the toenail is black but no pain and able to ambulate without any issue. Appointment made by agent prior to transfer to Nurse Triage. All questions answered.    Copied from CRM [id number]. Topic: Clinical - Red Word Triage >> Oct 13, 2023  5:18 PM Alvino Blood C wrote: Red Word that prompted transfer to Nurse Triage: PT has toe infection and toe is turning black Reason for Disposition  Bruised toenail  Answer Assessment - Initial Assessment Questions 1. MECHANISM: "How did the injury happen?"      Patient states that she cut her toenail  wrong about 1 week or 2 ago 2. ONSET: "When did the injury happen?" (Minutes or hours ago)      1 to 2 weeks ago 3. LOCATION: "What part of the toe is injured?" "Is the nail damaged?"      The nailbed is black 4. APPEARANCE of TOE INJURY: "What does the injury look like?"      Toe nail is black.  5. SEVERITY: "Can you use the foot normally?" "Can you walk?"      Able to walk normally 6. SIZE: For cuts, bruises, or swelling, ask: "How large is it?" (e.g., inches or centimeters;  entire toe)      N/A 7. PAIN: "Is there pain?" If Yes, ask: "How bad is the pain?"   (e.g., Scale 1-10; or mild, moderate, severe)     No pain 8. TETANUS: For any breaks in the skin, ask: "When was the last tetanus booster?"     Unsure of when she had one 9. DIABETES: "Do you have a history of diabetes or poor  circulation in the feet?"     No 10. OTHER SYMPTOMS: "Do you have any other symptoms?"        No  Protocols used: Toe Injury-A-AH

## 2023-10-17 ENCOUNTER — Telehealth: Payer: Self-pay

## 2023-10-17 NOTE — Telephone Encounter (Signed)
Per Dr. Benjamin Stain - o.k. to keep appt schld for 19th.

## 2023-10-17 NOTE — Telephone Encounter (Signed)
Copied from CRM [id number]. Topic: General - Other >> Oct 17, 2023  3:37 PM Donita Brooks wrote: Reason for CRM: Pt was to talk with someone about symptoms. Pt miss Kim call. Pt stated to call back so Selena Batten can clarify what she needs from pt

## 2023-10-17 NOTE — Telephone Encounter (Signed)
Copied from CRM [id number]. Topic: General - Call Back - No Documentation >> Oct 17, 2023 12:53 PM [name] wrote: Reason for CRM: Patient was calling back as they missed the phone call from the nurse. Patient will try to come in on the 19th - they will call if they're not able. Please call patient if you have any questions or need to speak with her.

## 2023-10-17 NOTE — Telephone Encounter (Signed)
Attempted to contact patient - to inquire about "black toenail" - Pt already has upcoming appt schld for 10/19/23.

## 2023-10-17 NOTE — Telephone Encounter (Signed)
Okay, just can take a look at it on the 19th.

## 2023-10-18 NOTE — Telephone Encounter (Signed)
Patient informed to keep upcoming appt schld for 10/19/23 with Dr. Ashley Royalty.

## 2023-10-19 ENCOUNTER — Ambulatory Visit: Payer: 59 | Admitting: Family Medicine

## 2023-10-19 ENCOUNTER — Ambulatory Visit (INDEPENDENT_AMBULATORY_CARE_PROVIDER_SITE_OTHER): Payer: 59 | Admitting: Sports Medicine

## 2023-10-19 DIAGNOSIS — Z1231 Encounter for screening mammogram for malignant neoplasm of breast: Secondary | ICD-10-CM

## 2023-10-19 DIAGNOSIS — Z23 Encounter for immunization: Secondary | ICD-10-CM

## 2023-10-19 DIAGNOSIS — L603 Nail dystrophy: Secondary | ICD-10-CM

## 2023-10-19 DIAGNOSIS — Z0000 Encounter for general adult medical examination without abnormal findings: Secondary | ICD-10-CM | POA: Diagnosis not present

## 2023-10-19 MED ORDER — TERBINAFINE HCL 250 MG PO TABS: 250.0000 mg | ORAL_TABLET | Freq: Every day | ORAL | 1 refills | Status: DC

## 2023-10-19 NOTE — Progress Notes (Signed)
    Procedures performed today:    None.  Independent interpretation of notes and tests performed by another provider:   None.  Brief History, Exam, Impression, and Recommendations:    Annual physical exam Tdap today, declines COVID, flu, Shingrix, pneumococcal.  Onychodystrophy Thickened yellow toenails, left worse than right, obvious onychodystrophy, this may be related to onychomycosis. No evidence of paronychia. Adding Lamisil, she will come back in a month for LFT check and we will treat her for 3 to 6 months, longer if needed. She understands that she should take a picture of it now and we can compare to what it looks like in 6 months.  Chronic process with exacerbation and pharmacologic intervention  ____________________________________________ Ihor Austin. Benjamin Stain, M.D., ABFM., CAQSM., AME. Primary Care and Sports Medicine [facility]  Adjunct Professor of Family Medicine  [employer]  Restaurant manager, fast food

## 2023-10-19 NOTE — Assessment & Plan Note (Addendum)
Thickened yellow toenails, left worse than right, obvious onychodystrophy, this may be related to onychomycosis. No evidence of paronychia. Adding Lamisil, she will come back in a month for LFT check and we will treat her for 3 to 6 months, longer if needed. She understands that she should take a picture of it now and we can compare to what it looks like in 6 months.

## 2023-10-19 NOTE — Assessment & Plan Note (Signed)
Tdap today, declines COVID, flu, Shingrix, pneumococcal.

## 2023-10-19 NOTE — Addendum Note (Signed)
Addended by: Carren Rang A on: 10/19/2023 04:39 PM   Modules accepted: Orders

## 2023-10-27 ENCOUNTER — Other Ambulatory Visit: Payer: Self-pay | Admitting: Sports Medicine

## 2023-10-29 ENCOUNTER — Other Ambulatory Visit: Payer: Self-pay | Admitting: Sports Medicine

## 2023-11-22 DIAGNOSIS — Z79899 Other long term (current) drug therapy: Secondary | ICD-10-CM | POA: Diagnosis not present

## 2023-11-22 DIAGNOSIS — D485 Neoplasm of uncertain behavior of skin: Secondary | ICD-10-CM | POA: Diagnosis not present

## 2023-11-22 DIAGNOSIS — L400 Psoriasis vulgaris: Secondary | ICD-10-CM | POA: Diagnosis not present

## 2023-11-22 DIAGNOSIS — C44519 Basal cell carcinoma of skin of other part of trunk: Secondary | ICD-10-CM | POA: Diagnosis not present

## 2023-11-22 DIAGNOSIS — Z85828 Personal history of other malignant neoplasm of skin: Secondary | ICD-10-CM | POA: Diagnosis not present

## 2023-11-28 ENCOUNTER — Other Ambulatory Visit: Payer: Self-pay | Admitting: Sports Medicine

## 2023-12-13 ENCOUNTER — Ambulatory Visit: Payer: 59

## 2023-12-18 DIAGNOSIS — C44519 Basal cell carcinoma of skin of other part of trunk: Secondary | ICD-10-CM | POA: Diagnosis not present

## 2023-12-19 DIAGNOSIS — L905 Scar conditions and fibrosis of skin: Secondary | ICD-10-CM | POA: Diagnosis not present

## 2023-12-19 DIAGNOSIS — C44519 Basal cell carcinoma of skin of other part of trunk: Secondary | ICD-10-CM | POA: Diagnosis not present

## 2023-12-26 ENCOUNTER — Other Ambulatory Visit: Payer: Self-pay | Admitting: Sports Medicine

## 2024-01-08 ENCOUNTER — Ambulatory Visit (HOSPITAL_BASED_OUTPATIENT_CLINIC_OR_DEPARTMENT_OTHER)
Admission: RE | Admit: 2024-01-08 | Discharge: 2024-01-08 | Disposition: A | Discharge status: Home / Self Care | Payer: 59 | Source: Ambulatory Visit | Attending: Sports Medicine | Admitting: Sports Medicine

## 2024-01-08 ENCOUNTER — Encounter (HOSPITAL_BASED_OUTPATIENT_CLINIC_OR_DEPARTMENT_OTHER): Payer: Self-pay

## 2024-01-08 DIAGNOSIS — Z1231 Encounter for screening mammogram for malignant neoplasm of breast: Secondary | ICD-10-CM | POA: Insufficient documentation

## 2024-01-08 DIAGNOSIS — Z0000 Encounter for general adult medical examination without abnormal findings: Secondary | ICD-10-CM

## 2024-01-14 ENCOUNTER — Other Ambulatory Visit: Payer: Self-pay | Admitting: Sports Medicine

## 2024-01-18 ENCOUNTER — Other Ambulatory Visit: Payer: Self-pay | Admitting: Sports Medicine

## 2024-01-18 DIAGNOSIS — E039 Hypothyroidism, unspecified: Secondary | ICD-10-CM

## 2024-01-21 ENCOUNTER — Other Ambulatory Visit: Payer: Self-pay | Admitting: Sports Medicine

## 2024-01-21 DIAGNOSIS — E039 Hypothyroidism, unspecified: Secondary | ICD-10-CM

## 2024-01-26 ENCOUNTER — Other Ambulatory Visit: Payer: Self-pay | Admitting: Sports Medicine

## 2024-01-29 ENCOUNTER — Ambulatory Visit (INDEPENDENT_AMBULATORY_CARE_PROVIDER_SITE_OTHER): Admitting: Sports Medicine

## 2024-01-29 DIAGNOSIS — E039 Hypothyroidism, unspecified: Secondary | ICD-10-CM

## 2024-01-29 DIAGNOSIS — E782 Mixed hyperlipidemia: Secondary | ICD-10-CM | POA: Diagnosis not present

## 2024-01-29 MED ORDER — LEVOTHYROXINE SODIUM 75 MCG PO TABS
75.0000 ug | ORAL_TABLET | Freq: Every day | ORAL | 3 refills | Status: AC
Start: 2024-01-29 — End: ?

## 2024-01-29 MED ORDER — TIRZEPATIDE 10 MG/0.5ML ~~LOC~~ SOAJ: SUBCUTANEOUS | 11 refills | Status: DC

## 2024-01-29 NOTE — Assessment & Plan Note (Signed)
 [name] returns, she never came back after the last time we called in compounded semaglutide, we will send in LipoSlim/tirzepatide, once that she gets that she will do it for a month and then come back and see me.

## 2024-01-29 NOTE — Progress Notes (Addendum)
    Procedures performed today:    None.  Independent interpretation of notes and tests performed by another provider:   None.  Brief History, Exam, Impression, and Recommendations:    Morbid obesity (HCC) [name] returns, she never came back after the last time we called in compounded semaglutide , we will send in LipoSlim/tirzepatide , once that she gets that she will do it for a month and then come back and see me.  Hypothyroidism Refilling levothyroxine , she does need routine labs.  Mixed hyperlipidemia Lipids uncontrolled, with history adding high dose crestor , recheck fasting lipids in 2-3 months.  Chronic process not at goal with pharmacologic intervention  ____________________________________________ [name], M.D., ABFM., CAQSM., AME. Primary Care and Sports Medicine [facility]  Adjunct Professor of [facility] Medicine  [employer]  Restaurant Manager, [employer]

## 2024-01-29 NOTE — Assessment & Plan Note (Signed)
 Refilling levothyroxine, she does need routine labs.

## 2024-01-31 DIAGNOSIS — R7309 Other abnormal glucose: Secondary | ICD-10-CM | POA: Diagnosis not present

## 2024-01-31 DIAGNOSIS — E782 Mixed hyperlipidemia: Secondary | ICD-10-CM | POA: Diagnosis not present

## 2024-02-01 LAB — COMPREHENSIVE METABOLIC PANEL WITH GFR
ALT: 20 IU/L (ref 0–32)
AST: 37 IU/L (ref 0–40)
Albumin: 4.1 g/dL (ref 3.8–4.8)
Alkaline Phosphatase: 102 IU/L (ref 44–121)
BUN/Creatinine Ratio: 21 (ref 12–28)
BUN: 20 mg/dL (ref 8–27)
Bilirubin Total: 0.5 mg/dL (ref 0.0–1.2)
CO2: 24 mmol/L (ref 20–29)
Calcium: 9.7 mg/dL (ref 8.7–10.3)
Chloride: 100 mmol/L (ref 96–106)
Creatinine, Ser: 0.97 mg/dL (ref 0.57–1.00)
Globulin, Total: 2.4 g/dL (ref 1.5–4.5)
Glucose: 152 mg/dL — ABNORMAL HIGH (ref 70–99)
Potassium: 4.4 mmol/L (ref 3.5–5.2)
Sodium: 141 mmol/L (ref 134–144)
Total Protein: 6.5 g/dL (ref 6.0–8.5)
eGFR: 62 mL/min/{1.73_m2} (ref >59)

## 2024-02-01 LAB — LIPID PANEL
Chol/HDL Ratio: 3.7 ratio (ref 0.0–4.4)
Cholesterol, Total: 223 mg/dL — ABNORMAL HIGH (ref 100–199)
HDL: 60 mg/dL (ref >39)
LDL Chol Calc (NIH): 117 mg/dL — ABNORMAL HIGH (ref 0–99)
Triglycerides: 268 mg/dL — ABNORMAL HIGH (ref 0–149)
VLDL Cholesterol Cal: 46 mg/dL — ABNORMAL HIGH (ref 5–40)

## 2024-02-01 LAB — CBC
Hematocrit: 42.1 % (ref 34.0–46.6)
Hemoglobin: 14.2 g/dL (ref 11.1–15.9)
MCH: 30.8 pg (ref 26.6–33.0)
MCHC: 33.7 g/dL (ref 31.5–35.7)
MCV: 91 fL (ref 79–97)
Platelets: 230 10*3/uL (ref 150–450)
RBC: 4.61 x10E6/uL (ref 3.77–5.28)
RDW: 12.6 % (ref 11.7–15.4)
WBC: 7.3 10*3/uL (ref 3.4–10.8)

## 2024-02-01 LAB — HEMOGLOBIN A1C
Est. average glucose Bld gHb Est-mCnc: 108 mg/dL
Hgb A1c MFr Bld: 5.4 % (ref 4.8–5.6)

## 2024-02-01 LAB — TSH: TSH: 3.11 u[IU]/mL (ref 0.450–4.500)

## 2024-02-02 ENCOUNTER — Telehealth: Payer: Self-pay

## 2024-02-02 MED ORDER — ROSUVASTATIN CALCIUM 40 MG PO TABS: 40.0000 mg | ORAL_TABLET | Freq: Every day | ORAL | 3 refills | Status: DC

## 2024-02-02 NOTE — Addendum Note (Signed)
 Addended by: Monica Becton on: 02/02/2024 09:12 PM   Modules accepted: Orders

## 2024-02-02 NOTE — Assessment & Plan Note (Signed)
 Lipids uncontrolled, with history adding high dose crestor, recheck fasting lipids in 2-3 months.

## 2024-02-02 NOTE — Telephone Encounter (Signed)
 Depends on what the diagnosis actually was, sometimes patients use the term "colitis" to refer to IBS, diarrhea, gas, etc, so not sure how to treat it until I get more history.

## 2024-02-02 NOTE — Telephone Encounter (Signed)
 Patient called - stats she has had colitis flare up yesterday that was very intense  She states she is better today but she is wanting to know if she should be on any medication regarding this

## 2024-02-05 NOTE — Telephone Encounter (Signed)
 Attempted call to patient. Left a voice mail message requesting a return call.

## 2024-02-05 NOTE — Telephone Encounter (Signed)
 Spoke with patient. States has known colitis history but last flare was a long time ago This episode was  diarrhea, gas, bloating , nausea, possible slight blood in stool - stool was greenish in color ( she feels was related to something she had eaten )  and pain just above belly button.  Patient has not eaten since Saturday as she is afraid of another flare up  She had taken benadryl for her allergies that she does not normally do and does not know if this was cause or possibly something she had eaten.   She has picked up weight loss med from [facility] but ha not yet started this as afraid this could cause a flare up as well.

## 2024-02-05 NOTE — Telephone Encounter (Signed)
 Ulcerative colitis? Crohns? Ischemic? Pseudomembranous? Infectious?   She's not giving me any details that are helpful.  I would suggest she talk to her gastroenterologist because I'm not sure I can help without any information. :-(

## 2024-02-06 NOTE — Telephone Encounter (Signed)
 Patient informed and will call her GI.

## 2024-03-23 ENCOUNTER — Other Ambulatory Visit: Payer: Self-pay | Admitting: Sports Medicine

## 2024-04-04 DIAGNOSIS — R55 Syncope and collapse: Secondary | ICD-10-CM | POA: Diagnosis not present

## 2024-04-04 DIAGNOSIS — I491 Atrial premature depolarization: Secondary | ICD-10-CM | POA: Diagnosis not present

## 2024-04-04 DIAGNOSIS — R42 Dizziness and giddiness: Secondary | ICD-10-CM | POA: Diagnosis not present

## 2024-04-04 DIAGNOSIS — I4819 Other persistent atrial fibrillation: Secondary | ICD-10-CM | POA: Diagnosis not present

## 2024-04-11 ENCOUNTER — Other Ambulatory Visit: Payer: Self-pay | Admitting: Sports Medicine

## 2024-04-11 DIAGNOSIS — M48061 Spinal stenosis, lumbar region without neurogenic claudication: Secondary | ICD-10-CM

## 2024-04-11 DIAGNOSIS — R55 Syncope and collapse: Secondary | ICD-10-CM | POA: Diagnosis not present

## 2024-04-11 DIAGNOSIS — I4819 Other persistent atrial fibrillation: Secondary | ICD-10-CM | POA: Diagnosis not present

## 2024-04-18 ENCOUNTER — Other Ambulatory Visit: Payer: Self-pay | Admitting: Sports Medicine

## 2024-04-18 DIAGNOSIS — K219 Gastro-esophageal reflux disease without esophagitis: Secondary | ICD-10-CM

## 2024-04-22 ENCOUNTER — Ambulatory Visit (INDEPENDENT_AMBULATORY_CARE_PROVIDER_SITE_OTHER): Payer: 59 | Admitting: Sports Medicine

## 2024-04-22 VITALS — BP 122/75 | HR 63 | Wt 190.0 lb

## 2024-04-22 DIAGNOSIS — I10 Essential (primary) hypertension: Secondary | ICD-10-CM | POA: Diagnosis not present

## 2024-04-22 DIAGNOSIS — L603 Nail dystrophy: Secondary | ICD-10-CM

## 2024-04-22 DIAGNOSIS — E039 Hypothyroidism, unspecified: Secondary | ICD-10-CM | POA: Diagnosis not present

## 2024-04-22 MED ORDER — TERBINAFINE HCL 250 MG PO TABS
250.0000 mg | ORAL_TABLET | Freq: Every day | ORAL | 1 refills | Status: DC
Start: 2024-04-22 — End: 2024-09-03

## 2024-04-22 NOTE — Progress Notes (Signed)
    Procedures performed today:    None.  Independent interpretation of notes and tests performed by another provider:   None.  Brief History, Exam, Impression, and Recommendations:    Benign essential hypertension [name] has been having episodes of orthostasis since losing a lot of weight. [name] is on 5 medications that can affect her blood pressure, irbesartan , metoprolol , furosemide , spironolactone , flecainide . She has lost over 30 pounds with tirzepatide , we will discontinue furosemide  and spironolactone  for now. I would like her to return in a nurse visit in about 2 weeks for a blood pressure check, goal 140/90. If blood pressure persistently low we will start tapering metoprolol  and irbesartan .  Onychodystrophy I saw [name] about 6 months ago with onychodystrophy and potential onychomycosis with thickening and yellowing. We added Lamisil , she is taken this for about 6 months, I did ask her to get her LFTs checked about a month after starting but she did not do this. She is now complaining of her eyes looking yellow. I do not see any jaundice on exam today however we will go ahead and recheck her liver function as well as bilirubin today. Her nails are starting to look much better, they are about halfway grown out with new clear nail. I do think we should do another 6 months on the Lamisil .  Morbid obesity (HCC) 30 pound weight loss on LipoSlim, currently on 40 units. She has plateaued so she will bump up to 50 units and increase monthly until she starts losing weight again.    ____________________________________________ [name]. Curtis, M.D., ABFM., CAQSM., AME. Primary Care and Sports Medicine [facility]  Adjunct Professor of [facility] Medicine  [employer]  Restaurant manager, fast food

## 2024-04-22 NOTE — Assessment & Plan Note (Signed)
 [name] has been having episodes of orthostasis since losing a lot of weight. [name] is on 5 medications that can affect her blood pressure, irbesartan , metoprolol , furosemide , spironolactone , flecainide . She has lost over 30 pounds with tirzepatide , we will discontinue furosemide  and spironolactone  for now. I would like her to return in a nurse visit in about 2 weeks for a blood pressure check, goal 140/90. If blood pressure persistently low we will start tapering metoprolol  and irbesartan .

## 2024-04-22 NOTE — Assessment & Plan Note (Signed)
 30 pound weight loss on LipoSlim, currently on 40 units. She has plateaued so she will bump up to 50 units and increase monthly until she starts losing weight again.

## 2024-04-22 NOTE — Assessment & Plan Note (Signed)
 I saw [name] about 6 months ago with onychodystrophy and potential onychomycosis with thickening and yellowing. We added Lamisil , she is taken this for about 6 months, I did ask her to get her LFTs checked about a month after starting but she did not do this. She is now complaining of her eyes looking yellow. I do not see any jaundice on exam today however we will go ahead and recheck her liver function as well as bilirubin today. Her nails are starting to look much better, they are about halfway grown out with new clear nail. I do think we should do another 6 months on the Lamisil .

## 2024-04-23 ENCOUNTER — Ambulatory Visit: Payer: Self-pay | Admitting: Sports Medicine

## 2024-04-23 LAB — CBC
Hematocrit: 40.6 % (ref 34.0–46.6)
Hemoglobin: 13.4 g/dL (ref 11.1–15.9)
MCH: 31.2 pg (ref 26.6–33.0)
MCHC: 33 g/dL (ref 31.5–35.7)
MCV: 95 fL (ref 79–97)
Platelets: 229 10*3/uL (ref 150–450)
RBC: 4.29 x10E6/uL (ref 3.77–5.28)
RDW: 13.5 % (ref 11.7–15.4)
WBC: 6.7 10*3/uL (ref 3.4–10.8)

## 2024-04-23 LAB — TSH: TSH: 2.32 u[IU]/mL (ref 0.450–4.500)

## 2024-04-23 LAB — LIPID PANEL
Chol/HDL Ratio: 2.2 ratio (ref 0.0–4.4)
Cholesterol, Total: 124 mg/dL (ref 100–199)
HDL: 57 mg/dL (ref >39)
LDL Chol Calc (NIH): 43 mg/dL (ref 0–99)
Triglycerides: 142 mg/dL (ref 0–149)
VLDL Cholesterol Cal: 24 mg/dL (ref 5–40)

## 2024-04-23 LAB — COMPREHENSIVE METABOLIC PANEL WITH GFR
ALT: 27 IU/L (ref 0–32)
AST: 58 IU/L — ABNORMAL HIGH (ref 0–40)
Albumin: 4.1 g/dL (ref 3.8–4.8)
Alkaline Phosphatase: 96 IU/L (ref 44–121)
BUN/Creatinine Ratio: 19 (ref 12–28)
BUN: 21 mg/dL (ref 8–27)
Bilirubin Total: 0.6 mg/dL (ref 0.0–1.2)
CO2: 21 mmol/L (ref 20–29)
Calcium: 9.3 mg/dL (ref 8.7–10.3)
Chloride: 101 mmol/L (ref 96–106)
Creatinine, Ser: 1.08 mg/dL — ABNORMAL HIGH (ref 0.57–1.00)
Globulin, Total: 2.3 g/dL (ref 1.5–4.5)
Glucose: 108 mg/dL — ABNORMAL HIGH (ref 70–99)
Potassium: 4.4 mmol/L (ref 3.5–5.2)
Sodium: 138 mmol/L (ref 134–144)
Total Protein: 6.4 g/dL (ref 6.0–8.5)
eGFR: 54 mL/min/{1.73_m2} — ABNORMAL LOW (ref >59)

## 2024-05-06 ENCOUNTER — Ambulatory Visit

## 2024-05-10 ENCOUNTER — Other Ambulatory Visit: Payer: Self-pay | Admitting: Sports Medicine

## 2024-05-10 ENCOUNTER — Ambulatory Visit: Payer: Self-pay

## 2024-05-10 DIAGNOSIS — F329 Major depressive disorder, single episode, unspecified: Secondary | ICD-10-CM

## 2024-05-10 NOTE — Telephone Encounter (Signed)
 FYI Only or Action Required?: Action required by provider: Patient's son is requesting treatment options for the patient if she refuses to go to the ED.  Patient was last seen in primary care on 04/22/2024 by Curtis Debby PARAS, MD.  Called Nurse Triage reporting Abdominal Pain.  Symptoms began 2 days ago.  Interventions attempted: Rest, hydration, or home remedies.  Symptoms are: gradually worsening.  Triage Disposition: Go to ED Now (Notify PCP)  Patient/caregiver understands and will follow disposition?: Unsure     Copied from CRM [id number]. Topic: Clinical - Red Word Triage >> May 10, 2024 10:42 AM Graeme ORN wrote: Red Word that prompted transfer to Nurse Triage: Terrible pain from colitis/diverticulitis - can't eat - will not go to er. Appt next week with specialist    Reason for Disposition  [1] SEVERE pain AND [2] age > 60 years  Answer Assessment - Initial Assessment Questions 1. LOCATION: Where does it hurt?      Lower abdomen  2. RADIATION: Does the pain shoot anywhere else? (e.g., chest, back)     Unsure  3. ONSET: When did the pain begin? (e.g., minutes, hours or days ago)      2 days ago  4. SUDDEN: Gradual or sudden onset?     Sudden 5. PATTERN Does the pain come and go, or is it constant?     Constant  6. SEVERITY: How bad is the pain?  (e.g., Scale 1-10; mild, moderate, or severe)     Moderate to severe 7. RECURRENT SYMPTOM: Have you ever had this type of stomach pain before? If Yes, ask: When was the last time? and What happened that time?      Yes, history of colitis 8. CAUSE: What do you think is causing the stomach pain? (e.g., gallstones, recent abdominal surgery)     History of colitis  9. RELIEVING/AGGRAVATING FACTORS: What makes it better or worse? (e.g., antacids, bending or twisting motion, bowel movement)     Eating exacerbates the pain  10. OTHER SYMPTOMS: Do you have any other symptoms? (e.g., back pain, diarrhea,  fever, urination pain, vomiting)       Unsure  Protocols used: Abdominal Pain - Female-A-AH

## 2024-05-13 ENCOUNTER — Ambulatory Visit (INDEPENDENT_AMBULATORY_CARE_PROVIDER_SITE_OTHER)

## 2024-05-13 ENCOUNTER — Other Ambulatory Visit: Payer: Self-pay | Admitting: Sports Medicine

## 2024-05-13 VITALS — BP 114/73 | HR 55 | Ht 63.0 in | Wt 190.0 lb

## 2024-05-13 DIAGNOSIS — F329 Major depressive disorder, single episode, unspecified: Secondary | ICD-10-CM

## 2024-05-13 DIAGNOSIS — I10 Essential (primary) hypertension: Secondary | ICD-10-CM

## 2024-05-13 NOTE — Progress Notes (Unsigned)
 Patient is here for blood pressure check. BP medications reduced pt to continue taking metoprlol and irbesartan , taper off. Pt currently taking Irbesartan  only.   Previous BP was 140/90  1st BP today: 114/73; 55bpm  Per Dr. Curtis, pt to d/c all BP medications. Return in 2 weeks for nurse visit BP check.   Pt agrees to treatment plan.

## 2024-05-13 NOTE — Patient Instructions (Addendum)
 Discontinue Irbesartan . Return for nurse visit in 2 weeks to check BP.

## 2024-05-15 ENCOUNTER — Other Ambulatory Visit: Payer: Self-pay | Admitting: Sports Medicine

## 2024-05-15 DIAGNOSIS — F329 Major depressive disorder, single episode, unspecified: Secondary | ICD-10-CM

## 2024-05-18 ENCOUNTER — Other Ambulatory Visit: Payer: Self-pay | Admitting: Sports Medicine

## 2024-05-20 DIAGNOSIS — Z9049 Acquired absence of other specified parts of digestive tract: Secondary | ICD-10-CM | POA: Diagnosis not present

## 2024-05-20 DIAGNOSIS — Z860100 Personal history of colon polyps, unspecified: Secondary | ICD-10-CM | POA: Diagnosis not present

## 2024-05-20 DIAGNOSIS — R1032 Left lower quadrant pain: Secondary | ICD-10-CM | POA: Diagnosis not present

## 2024-05-20 DIAGNOSIS — K5900 Constipation, unspecified: Secondary | ICD-10-CM | POA: Diagnosis not present

## 2024-05-20 DIAGNOSIS — K5792 Diverticulitis of intestine, part unspecified, without perforation or abscess without bleeding: Secondary | ICD-10-CM | POA: Diagnosis not present

## 2024-05-28 ENCOUNTER — Telehealth: Payer: Self-pay

## 2024-05-28 NOTE — Telephone Encounter (Signed)
 Copied from CRM [id number]. Topic: Clinical - Medication Question >> May 28, 2024 11:42 AM Graeme ORN wrote: Reason for CRM: [facility] Called. Patient has Procedure 8/11. Need written permission to stop  Eliquis  two days prior sent asap fax to: [phone]

## 2024-05-28 NOTE — Telephone Encounter (Signed)
 I will write a letter and it can be printed from the letters tab.

## 2024-05-28 NOTE — Telephone Encounter (Signed)
 Letter printed and faxed to [facility] .

## 2024-06-10 DIAGNOSIS — I4891 Unspecified atrial fibrillation: Secondary | ICD-10-CM | POA: Diagnosis not present

## 2024-06-10 DIAGNOSIS — Z860100 Personal history of colon polyps, unspecified: Secondary | ICD-10-CM | POA: Diagnosis not present

## 2024-06-10 DIAGNOSIS — D123 Benign neoplasm of transverse colon: Secondary | ICD-10-CM | POA: Diagnosis not present

## 2024-06-10 DIAGNOSIS — D126 Benign neoplasm of colon, unspecified: Secondary | ICD-10-CM | POA: Diagnosis not present

## 2024-06-10 DIAGNOSIS — Z09 Encounter for follow-up examination after completed treatment for conditions other than malignant neoplasm: Secondary | ICD-10-CM | POA: Diagnosis not present

## 2024-06-10 DIAGNOSIS — Z1211 Encounter for screening for malignant neoplasm of colon: Secondary | ICD-10-CM | POA: Diagnosis not present

## 2024-06-10 DIAGNOSIS — D125 Benign neoplasm of sigmoid colon: Secondary | ICD-10-CM | POA: Diagnosis not present

## 2024-06-10 DIAGNOSIS — K635 Polyp of colon: Secondary | ICD-10-CM | POA: Diagnosis not present

## 2024-06-15 ENCOUNTER — Other Ambulatory Visit: Payer: Self-pay | Admitting: Sports Medicine

## 2024-06-15 DIAGNOSIS — F329 Major depressive disorder, single episode, unspecified: Secondary | ICD-10-CM

## 2024-06-28 ENCOUNTER — Other Ambulatory Visit: Payer: Self-pay

## 2024-06-28 DIAGNOSIS — F329 Major depressive disorder, single episode, unspecified: Secondary | ICD-10-CM

## 2024-06-28 MED ORDER — BUPROPION HCL ER (XL) 150 MG PO TB24: 150.0000 mg | ORAL_TABLET | Freq: Every morning | ORAL | 1 refills | Status: DC

## 2024-07-02 ENCOUNTER — Encounter: Payer: Self-pay | Admitting: Sports Medicine

## 2024-08-22 ENCOUNTER — Telehealth: Payer: Self-pay

## 2024-08-22 NOTE — Telephone Encounter (Signed)
 Copied from CRM [id number]. Topic: Appointments - Transfer of Care >> Aug 22, 2024  9:35 AM Antony S wrote: Pt is requesting to transfer FROM: thekkekandam Pt is requesting to transfer TO: joy jessup Reason for requested transfer: pt reuqest It is the responsibility of the team the patient would like to transfer to (Dr. willo) to reach out to the patient if for any reason this transfer is not acceptable.

## 2024-08-22 NOTE — Telephone Encounter (Signed)
 Would you please assist this patient in scheduling a TOC to Zada Palin, NP

## 2024-08-23 NOTE — Telephone Encounter (Signed)
 Patient is scheduled for 08/30/2024 at 10:50.

## 2024-08-26 NOTE — Progress Notes (Unsigned)
   08/26/2024  Patient ID: Levorn GORMAN Raw, female   DOB: 07/18/1951, 73 y.o.   MRN: [id number]  This patient is appearing on a report for being at risk of failing the adherence measure for hypertension (ACEi/ARB) medications this calendar year.   Medication: Irbesartan  75 mg tablets Patient discontinued medication.   Insurance report was not up to date. No action needed at this time.   Patsy Varma C. Lanelle Lindo [facility] PharmD Candidate Class of [phone]

## 2024-08-30 ENCOUNTER — Encounter: Admitting: Medical-Surgical

## 2024-08-30 DIAGNOSIS — M25562 Pain in left knee: Secondary | ICD-10-CM | POA: Diagnosis not present

## 2024-09-03 ENCOUNTER — Encounter: Payer: Self-pay | Admitting: Medical-Surgical

## 2024-09-03 ENCOUNTER — Ambulatory Visit (INDEPENDENT_AMBULATORY_CARE_PROVIDER_SITE_OTHER): Admitting: Medical-Surgical

## 2024-09-03 VITALS — BP 148/75 | HR 55 | Resp 20 | Ht 63.0 in | Wt 191.1 lb

## 2024-09-03 DIAGNOSIS — E782 Mixed hyperlipidemia: Secondary | ICD-10-CM

## 2024-09-03 DIAGNOSIS — F329 Major depressive disorder, single episode, unspecified: Secondary | ICD-10-CM | POA: Diagnosis not present

## 2024-09-03 DIAGNOSIS — I482 Chronic atrial fibrillation, unspecified: Secondary | ICD-10-CM

## 2024-09-03 DIAGNOSIS — I2510 Atherosclerotic heart disease of native coronary artery without angina pectoris: Secondary | ICD-10-CM

## 2024-09-03 DIAGNOSIS — M170 Bilateral primary osteoarthritis of knee: Secondary | ICD-10-CM | POA: Diagnosis not present

## 2024-09-03 DIAGNOSIS — K219 Gastro-esophageal reflux disease without esophagitis: Secondary | ICD-10-CM

## 2024-09-03 DIAGNOSIS — I10 Essential (primary) hypertension: Secondary | ICD-10-CM

## 2024-09-03 DIAGNOSIS — R7303 Prediabetes: Secondary | ICD-10-CM | POA: Diagnosis not present

## 2024-09-03 DIAGNOSIS — H9193 Unspecified hearing loss, bilateral: Secondary | ICD-10-CM

## 2024-09-03 DIAGNOSIS — E039 Hypothyroidism, unspecified: Secondary | ICD-10-CM

## 2024-09-03 DIAGNOSIS — I5032 Chronic diastolic (congestive) heart failure: Secondary | ICD-10-CM

## 2024-09-03 DIAGNOSIS — Z7689 Persons encountering health services in other specified circumstances: Secondary | ICD-10-CM

## 2024-09-03 DIAGNOSIS — M48061 Spinal stenosis, lumbar region without neurogenic claudication: Secondary | ICD-10-CM

## 2024-09-03 LAB — POCT GLYCOSYLATED HEMOGLOBIN (HGB A1C): Hemoglobin A1C: 5.2 % (ref 4.0–5.6)

## 2024-09-03 MED ORDER — DILTIAZEM HCL ER COATED BEADS 360 MG PO CP24: 360.0000 mg | ORAL_CAPSULE | Freq: Every day | ORAL | 3 refills | Status: AC

## 2024-09-03 MED ORDER — VENLAFAXINE HCL ER 75 MG PO CP24: 225.0000 mg | ORAL_CAPSULE | Freq: Every day | ORAL | 11 refills | Status: AC

## 2024-09-03 MED ORDER — ROLLATOR ULTRA-LIGHT MISC: 0 refills | Status: AC

## 2024-09-03 MED ORDER — BUPROPION HCL ER (XL) 150 MG PO TB24: 150.0000 mg | ORAL_TABLET | Freq: Every morning | ORAL | 3 refills | Status: AC

## 2024-09-03 MED ORDER — ICOSAPENT ETHYL 1 G PO CAPS: 2.0000 g | ORAL_CAPSULE | Freq: Two times a day (BID) | ORAL | 3 refills | Status: AC

## 2024-09-03 MED ORDER — TRAZODONE HCL 100 MG PO TABS: 100.0000 mg | ORAL_TABLET | Freq: Every day | ORAL | 3 refills | Status: AC

## 2024-09-03 MED ORDER — ROSUVASTATIN CALCIUM 40 MG PO TABS: 40.0000 mg | ORAL_TABLET | Freq: Every day | ORAL | 3 refills | Status: AC

## 2024-09-03 MED ORDER — TRAMADOL HCL 50 MG PO TABS: 50.0000 mg | ORAL_TABLET | Freq: Three times a day (TID) | ORAL | 3 refills | Status: AC | PRN
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
## 2024-09-04 NOTE — Progress Notes (Signed)
 Medical screening examination/treatment was performed by qualified clinical staff member and as supervising provider I was immediately available for consultation/collaboration. I have reviewed documentation and agree with assessment and plan.  Thayer Ohm, DNP, APRN, FNP-BC [facility]

## 2024-09-09 ENCOUNTER — Telehealth: Payer: Self-pay | Admitting: Medical-Surgical

## 2024-09-09 NOTE — Telephone Encounter (Signed)
 Copied from CRM [id number]. Topic: Referral - Status >> Sep 09, 2024  3:11 PM Zebedee SAUNDERS wrote: Reason for CRM: Pt needs referral #[id number] redirect and sent to [facility] or [facility].

## 2024-09-12 ENCOUNTER — Other Ambulatory Visit: Payer: Self-pay | Admitting: Medical-Surgical

## 2024-09-12 DIAGNOSIS — K219 Gastro-esophageal reflux disease without esophagitis: Secondary | ICD-10-CM
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
## 2024-09-12 NOTE — Telephone Encounter (Unsigned)
 Copied from CRM [id number]. Topic: Clinical - Medication Refill >> Sep 12, 2024  4:12 PM Amy B wrote: Medication: pantoprazole  (PROTONIX ) 40 MG tablet  Has the patient contacted their pharmacy? No (Agent: If no, request that the patient contact the pharmacy for the refill. If patient does not wish to contact the pharmacy document the reason why and proceed with request.) (Agent: If yes, when and what did the pharmacy advise?)  This is the patient's preferred pharmacy:  [facility] [id number] - [place], [place] - [address] [place] [address] Phone: [phone] Fax: [phone]  [facility] - [place], [place] - [address] [place] [address] [facility] [address] [address] [place] [address] Phone: [phone] Fax: [phone]  Is this the correct pharmacy for this prescription? Yes If no, delete pharmacy and type the correct one.   Has the prescription been filled recently? No  Is the patient out of the medication? Yes  Has the patient been seen for an appointment in the last year OR does the patient have an upcoming appointment? Yes  Can we respond through MyChart? No patient prefers phone call  Agent: Please be advised that Rx refills may take up to 3 business days. We ask that you follow-up with your pharmacy.

## 2024-09-13 ENCOUNTER — Ambulatory Visit: Admitting: Physician Assistant

## 2024-09-13 ENCOUNTER — Other Ambulatory Visit: Payer: Self-pay | Admitting: Urgent Care

## 2024-09-13 MED ORDER — PANTOPRAZOLE SODIUM 40 MG PO TBEC: 40.0000 mg | DELAYED_RELEASE_TABLET | Freq: Every day | ORAL | 3 refills | Status: AC

## 2024-10-03 ENCOUNTER — Other Ambulatory Visit: Payer: Self-pay

## 2024-10-03 MED ORDER — ELIQUIS 5 MG PO TABS: 5.0000 mg | ORAL_TABLET | Freq: Two times a day (BID) | ORAL | 2 refills | Status: AC

## 2024-10-07 NOTE — Telephone Encounter (Signed)
 Copied from CRM [id number]. Topic: Clinical - Medication Refill >> Oct 07, 2024  3:12 PM Delon HERO wrote: Medication: flecainide  (TAMBOCOR ) 50 MG tablet [[id number]] Historical Provider Dr. ONEIDA  Has the patient contacted their pharmacy? Yes (Agent: If no, request that the patient contact the pharmacy for the refill. If patient does not wish to contact the pharmacy document the reason why and proceed with request.) (Agent: If yes, when and what did the pharmacy advise?)  This is the patient's preferred pharmacy:  [facility] [id number] - [place], [place] - [address] [place] [address] Phone: [phone] Fax: [phone]   Is this the correct pharmacy for this prescription? Yes If no, delete pharmacy and type the correct one.   Has the prescription been filled recently? Yes  Is the patient out of the medication? Yes  Has the patient been seen for an appointment in the last year OR does the patient have an upcoming appointment? Yes  Can we respond through MyChart? Yes  Agent: Please be advised that Rx refills may take up to 3 business days. We ask that you follow-up with your pharmacy.

## 2024-10-10 ENCOUNTER — Other Ambulatory Visit: Payer: Self-pay | Admitting: Medical-Surgical

## 2024-10-10 NOTE — Telephone Encounter (Signed)
 Patient called.  She is following up on request for refill on her flecainide .

## 2024-10-10 NOTE — Telephone Encounter (Signed)
 Patient is requesting a refill on this medication.  Last filled 03/20/2019 from Historical Provider  Last OV 09/03/2024

## 2024-10-16 ENCOUNTER — Telehealth: Payer: Self-pay

## 2024-10-16 NOTE — Telephone Encounter (Signed)
 Copied from CRM #[id number]. Topic: Clinical - Medication Question >> Oct 15, 2024  5:20 PM Delon T wrote: Reason for CRM: flecainide  (TAMBOCOR ) 50 MG tablet- this medication was prescribed by Dr T and she needs to know if she is still supposed to take this- [phone]

## 2024-10-16 NOTE — Telephone Encounter (Signed)
 Left message for patient to call back

## 2024-10-17 NOTE — Telephone Encounter (Signed)
 Pt returned call and verbalized understanding. States she will contact her cardiologist.

## 2024-11-05 ENCOUNTER — Ambulatory Visit

## 2024-11-19 ENCOUNTER — Ambulatory Visit

## 2024-12-04 ENCOUNTER — Telehealth: Payer: Self-pay

## 2024-12-04 ENCOUNTER — Other Ambulatory Visit: Payer: Self-pay | Admitting: Medical-Surgical

## 2024-12-04 MED ORDER — TIRZEPATIDE 10 MG/0.5ML ~~LOC~~ SOAJ: SUBCUTANEOUS | 11 refills | Status: DC

## 2024-12-04 MED ORDER — TIRZEPATIDE 10 MG/0.5ML ~~LOC~~ SOAJ: SUBCUTANEOUS | 11 refills | Status: AC

## 2024-12-04 NOTE — Telephone Encounter (Signed)
 Attempted to contact the patient. No answer. Left a vm msg informing the patient the tirzepatide  refill has been sent to her pharmacy. Direct call back information provided.

## 2024-12-04 NOTE — Telephone Encounter (Signed)
 Copied from CRM #[id number]. Topic: Clinical - Medication Question >> Dec 03, 2024  5:17 PM Tobias L wrote: Reason for CRM: Patient inquiring if she still has refills on her diet shot. Patient did not know name of medication but states it should be on her file.   Requesting callback:  [phone]

## 2024-12-04 NOTE — Telephone Encounter (Signed)
 No weight loss medication listed in patient's active med list. Per the provider's last office note on 09/03/24 - -Restart tirzepatide  10 mg injection weekly. Please advise, thanks.
